# Patient Record
Sex: Female | Born: 1965
Health system: Southern US, Community
[De-identification: ages and names within clinical notes are randomized; demographics above are authoritative.]

## PROBLEM LIST (undated history)

## (undated) DIAGNOSIS — M549 Dorsalgia, unspecified: Secondary | ICD-10-CM

## (undated) DIAGNOSIS — D649 Anemia, unspecified: Secondary | ICD-10-CM

## (undated) DIAGNOSIS — K829 Disease of gallbladder, unspecified: Secondary | ICD-10-CM

## (undated) DIAGNOSIS — E785 Hyperlipidemia, unspecified: Secondary | ICD-10-CM

## (undated) DIAGNOSIS — E538 Deficiency of other specified B group vitamins: Secondary | ICD-10-CM

## (undated) DIAGNOSIS — Z9889 Other specified postprocedural states: Secondary | ICD-10-CM

## (undated) DIAGNOSIS — Z8669 Personal history of other diseases of the nervous system and sense organs: Secondary | ICD-10-CM

## (undated) DIAGNOSIS — R0600 Dyspnea, unspecified: Secondary | ICD-10-CM

## (undated) DIAGNOSIS — E041 Nontoxic single thyroid nodule: Secondary | ICD-10-CM

## (undated) DIAGNOSIS — M25559 Pain in unspecified hip: Secondary | ICD-10-CM

## (undated) DIAGNOSIS — M1990 Unspecified osteoarthritis, unspecified site: Secondary | ICD-10-CM

## (undated) DIAGNOSIS — R112 Nausea with vomiting, unspecified: Secondary | ICD-10-CM

## (undated) DIAGNOSIS — T4145XA Adverse effect of unspecified anesthetic, initial encounter: Secondary | ICD-10-CM

## (undated) DIAGNOSIS — K760 Fatty (change of) liver, not elsewhere classified: Secondary | ICD-10-CM

## (undated) DIAGNOSIS — K219 Gastro-esophageal reflux disease without esophagitis: Secondary | ICD-10-CM

## (undated) DIAGNOSIS — Z8719 Personal history of other diseases of the digestive system: Secondary | ICD-10-CM

## (undated) DIAGNOSIS — T8859XA Other complications of anesthesia, initial encounter: Secondary | ICD-10-CM

## (undated) DIAGNOSIS — J189 Pneumonia, unspecified organism: Secondary | ICD-10-CM

## (undated) DIAGNOSIS — R011 Cardiac murmur, unspecified: Secondary | ICD-10-CM

## (undated) DIAGNOSIS — M2550 Pain in unspecified joint: Secondary | ICD-10-CM

## (undated) DIAGNOSIS — D509 Iron deficiency anemia, unspecified: Secondary | ICD-10-CM

## (undated) DIAGNOSIS — R600 Localized edema: Secondary | ICD-10-CM

## (undated) DIAGNOSIS — M25569 Pain in unspecified knee: Secondary | ICD-10-CM

## (undated) DIAGNOSIS — K5900 Constipation, unspecified: Secondary | ICD-10-CM

## (undated) DIAGNOSIS — I10 Essential (primary) hypertension: Secondary | ICD-10-CM

## (undated) HISTORY — DX: Unspecified osteoarthritis, unspecified site: M19.90

## (undated) HISTORY — DX: Pain in unspecified knee: M25.569

## (undated) HISTORY — DX: Essential (primary) hypertension: I10

## (undated) HISTORY — PX: WISDOM TOOTH EXTRACTION: SHX21

## (undated) HISTORY — DX: Pain in unspecified hip: M25.559

## (undated) HISTORY — DX: Localized edema: R60.0

## (undated) HISTORY — PX: HERNIA REPAIR: SHX51

## (undated) HISTORY — DX: Cardiac murmur, unspecified: R01.1

## (undated) HISTORY — DX: Disease of gallbladder, unspecified: K82.9

## (undated) HISTORY — DX: Deficiency of other specified B group vitamins: E53.8

## (undated) HISTORY — PX: UMBILICAL HERNIA REPAIR: SHX196

## (undated) HISTORY — DX: Dorsalgia, unspecified: M54.9

## (undated) HISTORY — DX: Dyspnea, unspecified: R06.00

## (undated) HISTORY — PX: ABDOMINAL HYSTERECTOMY: SHX81

## (undated) HISTORY — DX: Pain in unspecified joint: M25.50

## (undated) HISTORY — PX: COLONOSCOPY: SHX174

## (undated) HISTORY — DX: Iron deficiency anemia, unspecified: D50.9

## (undated) HISTORY — DX: Fatty (change of) liver, not elsewhere classified: K76.0

## (undated) HISTORY — DX: Constipation, unspecified: K59.00

## (undated) HISTORY — DX: Gastro-esophageal reflux disease without esophagitis: K21.9

## (undated) HISTORY — DX: Hyperlipidemia, unspecified: E78.5

## (undated) HISTORY — DX: Nontoxic single thyroid nodule: E04.1

## (undated) HISTORY — DX: Anemia, unspecified: D64.9

---
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
## 1992-01-14 HISTORY — PX: OTHER SURGICAL HISTORY: SHX169

## 1996-09-12 HISTORY — PX: CARPAL TUNNEL RELEASE: SHX101

## 2000-06-15 HISTORY — PX: ROUX-EN-Y GASTRIC BYPASS: SHX1104

## 2001-06-15 HISTORY — PX: CHOLECYSTECTOMY: SHX55

## 2002-06-15 HISTORY — PX: APPENDECTOMY: SHX54

## 2004-05-15 HISTORY — PX: ABDOMINOPLASTY: SUR9

## 2008-12-18 DIAGNOSIS — R079 Chest pain, unspecified: Secondary | ICD-10-CM | POA: Insufficient documentation

## 2009-03-15 HISTORY — PX: PARTIAL HYSTERECTOMY: SHX80

## 2009-05-14 ENCOUNTER — Ambulatory Visit: Payer: Self-pay | Admitting: Family Medicine

## 2009-05-14 DIAGNOSIS — H6980 Other specified disorders of Eustachian tube, unspecified ear: Secondary | ICD-10-CM | POA: Insufficient documentation

## 2009-05-14 DIAGNOSIS — E785 Hyperlipidemia, unspecified: Secondary | ICD-10-CM | POA: Insufficient documentation

## 2009-05-14 DIAGNOSIS — I10 Essential (primary) hypertension: Secondary | ICD-10-CM | POA: Insufficient documentation

## 2009-05-14 DIAGNOSIS — D509 Iron deficiency anemia, unspecified: Secondary | ICD-10-CM | POA: Insufficient documentation

## 2009-08-25 ENCOUNTER — Ambulatory Visit: Payer: Self-pay | Admitting: Family Medicine

## 2009-09-07 ENCOUNTER — Ambulatory Visit: Payer: Self-pay | Admitting: Family Medicine

## 2009-09-07 DIAGNOSIS — R42 Dizziness and giddiness: Secondary | ICD-10-CM | POA: Insufficient documentation

## 2009-09-07 LAB — CONVERTED CEMR LAB
ALT: 19 units/L (ref 0–35)
AST: 17 units/L (ref 0–37)
Alkaline Phosphatase: 100 units/L (ref 39–117)
Basophils Absolute: 0.1 10*3/uL (ref 0.0–0.1)
Calcium: 9.5 mg/dL (ref 8.4–10.5)
Chloride: 104 meq/L (ref 96–112)
Creatinine, Ser: 0.65 mg/dL (ref 0.40–1.20)
Eosinophils Absolute: 0 10*3/uL (ref 0.0–0.7)
HCT: 39 % (ref 36.0–46.0)
Hemoglobin: 12.4 g/dL (ref 12.0–15.0)
Lymphocytes Relative: 31 % (ref 12–46)
Lymphs Abs: 3.1 10*3/uL (ref 0.7–4.0)
MCHC: 31.8 g/dL (ref 30.0–36.0)
MCV: 85.3 fL (ref 78.0–100.0)
Neutrophils Relative %: 62 % (ref 43–77)
Potassium: 4.6 meq/L (ref 3.5–5.3)
RBC: 4.57 M/uL (ref 3.87–5.11)

## 2009-09-14 ENCOUNTER — Telehealth: Payer: Self-pay | Admitting: Family Medicine

## 2009-09-16 ENCOUNTER — Encounter: Payer: Self-pay | Admitting: Family Medicine

## 2009-12-09 ENCOUNTER — Ambulatory Visit: Payer: Self-pay | Admitting: Family Medicine

## 2009-12-10 LAB — CONVERTED CEMR LAB
Alkaline Phosphatase: 95 units/L (ref 39–117)
BUN: 9 mg/dL (ref 6–23)
CO2: 26 meq/L (ref 19–32)
Cholesterol: 210 mg/dL — ABNORMAL HIGH (ref 0–200)
Creatinine, Ser: 0.59 mg/dL (ref 0.40–1.20)
Glucose, Bld: 88 mg/dL (ref 70–99)
HCT: 39.2 % (ref 36.0–46.0)
HDL: 48 mg/dL (ref >39)
LDL Cholesterol: 119 mg/dL — ABNORMAL HIGH (ref 0–99)
MCHC: 31.9 g/dL (ref 30.0–36.0)
MCV: 90.1 fL (ref 78.0–100.0)
RBC: 4.35 M/uL (ref 3.87–5.11)
Sodium: 141 meq/L (ref 135–145)
Total Bilirubin: 0.4 mg/dL (ref 0.3–1.2)
Total CHOL/HDL Ratio: 4.4
Total Protein: 7.2 g/dL (ref 6.0–8.3)
Triglycerides: 214 mg/dL — ABNORMAL HIGH (ref <150)
VLDL: 43 mg/dL — ABNORMAL HIGH (ref 0–40)
WBC: 9.4 10*3/uL (ref 4.0–10.5)

## 2010-03-14 ENCOUNTER — Ambulatory Visit: Payer: Self-pay | Admitting: Family Medicine

## 2010-03-14 DIAGNOSIS — R109 Unspecified abdominal pain: Secondary | ICD-10-CM | POA: Insufficient documentation

## 2010-03-14 LAB — CONVERTED CEMR LAB
Basophils Absolute: 0 10*3/uL (ref 0.0–0.1)
Bilirubin Urine: NEGATIVE
Eosinophils Absolute: 0.1 10*3/uL (ref 0.0–0.7)
Eosinophils Relative: 1 % (ref 0–5)
HCT: 37.7 % (ref 36.0–46.0)
Hemoglobin: 12.7 g/dL (ref 12.0–15.0)
Ketones, urine, test strip: NEGATIVE
Lymphocytes Relative: 26 % (ref 12–46)
Lymphs Abs: 3.6 10*3/uL (ref 0.7–4.0)
MCV: 88.4 fL (ref 78.0–100.0)
Monocytes Absolute: 0.6 10*3/uL (ref 0.1–1.0)
Platelets: 327 10*3/uL (ref 150–400)
Protein, U semiquant: NEGATIVE
RDW: 15.2 % (ref 11.5–15.5)
Specific Gravity, Urine: <1.005
WBC Urine, dipstick: NEGATIVE
pH: 6.5

## 2010-03-15 LAB — CONVERTED CEMR LAB
Alkaline Phosphatase: 88 units/L (ref 39–117)
CO2: 28 meq/L (ref 19–32)
Creatinine, Ser: 0.57 mg/dL (ref 0.40–1.20)
Glucose, Bld: 97 mg/dL (ref 70–99)
Sodium: 139 meq/L (ref 135–145)
Total Bilirubin: 0.6 mg/dL (ref 0.3–1.2)

## 2010-06-16 NOTE — Assessment & Plan Note (Signed)
Summary: CPE   Vital Signs:  Patient profile:   45 year old female Height:      64 inches Weight:      241 pounds Pulse rate:   81 / minute BP sitting:   128 / 78  (left arm) Cuff size:   large  Vitals Entered By: Avon Gully CMA, Duncan Dull) (December 09, 2009 8:19 AM) CC: CPE no pap Is Patient Diabetic? No   Primary Care Provider:  Nani Gasser MD  CC:  CPE no pap.  History of Present Illness: Here for CPE. No specific concerns. Has had hysterctomy.  Last mammo was in December and was normal.    Current Medications (verified): 1)  Lisinopril-Hydrochlorothiazide 20-12.5 Mg Tabs (Lisinopril-Hydrochlorothiazide) .[name].. 1 Tab By Mouth Once Daily 2)  Pravastatin Sodium 40 Mg Tabs (Pravastatin Sodium) .[name].. 1 Tab  By Mouth Once Daily 3)  Vitamin D 1000 Unit Tabs (Cholecalciferol) .[name].. 1 Tab By Mouth Once Daily 4)  Vitamin B-12 500 Mcg Tabs (Cyanocobalamin) .[name].. 1 Tab By Mouth Once Daily 5)  Multivitamins  Tabs (Multiple Vitamin) .[name].. 1 Tab By Mouth Once Daily 6)  Claritin-D 24 Hour 10-240 Mg Xr24h-Tab (Loratadine-Pseudoephedrine) 7)  Flonase 50 Mcg/act Susp (Fluticasone Propionate) .... Sig 2 Puffs Each Nostril Qday 8)  Singulair 10 Mg Tabs (Montelukast Sodium) .... Take 1 Tablet By Mouth Once A Day  Allergies (verified): 1)  ! * Seasonal  Comments:  Nurse/Medical Assistant: The patient's medications and allergies were reviewed with the patient and were updated in the Medication and Allergy Lists. Avon Gully CMA, Duncan Dull) (December 09, 2009 8:20 AM)  Past History:  Past Medical History: Last updated: 05/14/2009 Allergic rhinitis Anemia-iron deficiency Hyperlipidemia Hypertension  Past Surgical History: Hysterectomy 03/2009 carpal tunnel to right hand 09/1996 hernia repair, umbilical Cholecystectomy 06/2001 Appendectomy 06/2002 Bariatric surgery 06/2000, roux-en-y 2 cysts removed from left hand  01/1992 Left knee cartilage removed 11/1984  Family History: Family  History Diabetes 1st degree relative GF with MI, colon Ca GM with BrCA Father with DM, hi chol, HTN Mother wtih skin cancer, osteoprosis Brother with thyroid Ca  Social History: Reviewed history from 09/07/2009 and no changes required. Works in Clinical biochemist for [employer].  HS degree. Never Smoked Alcohol use-yes Drug use-none Occupation: [employer] Married to [employer] with no kids.  4 caffeinated drinks per day.   Review of Systems  The patient denies anorexia, fever, weight loss, weight gain, vision loss, decreased hearing, hoarseness, chest pain, syncope, dyspnea on exertion, peripheral edema, prolonged cough, headaches, hemoptysis, abdominal pain, melena, hematochezia, severe indigestion/heartburn, hematuria, incontinence, genital sores, muscle weakness, suspicious skin lesions, transient blindness, difficulty walking, depression, unusual weight change, abnormal bleeding, enlarged lymph nodes, and breast masses.    Physical Exam  General:  Well-developed,well-nourished,in no acute distress; alert,appropriate and cooperative throughout examination Head:  Normocephalic and atraumatic without obvious abnormalities. No apparent alopecia or balding. Eyes:  No corneal or conjunctival inflammation noted. EOMI. Perrla.  Ears:  External ear exam shows no significant lesions or deformities.  Otoscopic examination reveals clear canals, tympanic membranes are intact bilaterally without bulging, retraction, inflammation or discharge. Hearing is grossly normal bilaterally. Nose:  External nasal examination shows no deformity or inflammation.  Mouth:  Oral mucosa and oropharynx without lesions or exudates.  Teeth in good repair. Neck:  No deformities, masses, or tenderness noted. Breasts:  Righ breas t with no lesions, normal skin and areola.  Left with a small 0.5 cm round mobile lesion at the  11 o'clock position about 4cm from the edge of the areola.   Lungs:  Normal  respiratory effort, chest expands symmetrically. Lungs are clear to auscultation, no crackles or wheezes. Heart:  Normal rate and regular rhythm. S1 and S2 normal without gallop, murmur, click, rub or other extra sounds. Abdomen:  Bowel sounds positive,abdomen soft and non-tender without masses, organomegaly or hernias noted. Msk:  No deformity or scoliosis noted of thoracic or lumbar spine.   Pulses:  R and L carotid,radialdorsalis pedis and posterior tibial pulses are full and equal bilaterally Extremities:  No clubbing, cyanosis, edema, or deformity noted with normal full range of motion of all joints.   Neurologic:  No cranial nerve deficits noted. Station and gait are normal. DTRs are symmetrical throughout. Sensory, motor and coordinative functions appear intact. Skin:  no rashes.   Cervical Nodes:  No lymphadenopathy noted Axillary Nodes:  No palpable lymphadenopathy Psych:  Cognition and judgment appear intact. Alert and cooperative with normal attention span and concentration. No apparent delusions, illusions, hallucinations   Impression & Recommendations:  Problem # 1:  HEALTH MAINTENANCE EXAM (ICD-V70.0) Exam is normal today except for obesity. Encouraged more regular exercise.  Due for labs Encourage daily calcium and vit D.  TDap given today Monitor lesion on the left breast, if changes or gets larger go for mammo [place]. Her mammogram is current and all have been normal in the past.  Pt understands.   Orders: T-Comprehensive Metabolic Panel [id number]) T-Lipid Profile [id number]) T-Vitamin D (25-Hydroxy) [id number]) T-TSH [phone])  Complete Medication List: 1)  Lisinopril-hydrochlorothiazide 20-12.5 Mg Tabs (Lisinopril-hydrochlorothiazide) .[name].. 1 tab by mouth once daily 2)  Pravastatin Sodium 40 Mg Tabs (Pravastatin sodium) .[name].. 1 tab  by mouth once daily 3)  Vitamin D 1000 Unit Tabs (Cholecalciferol) .[name].. 1 tab by mouth once daily 4)  Vitamin B-12 500 Mcg  Tabs (Cyanocobalamin) .[name].. 1 tab by mouth once daily 5)  Multivitamins Tabs (Multiple vitamin) .[name].. 1 tab by mouth once daily 6)  Claritin-d 24 Hour 10-240 Mg Xr24h-tab (Loratadine-pseudoephedrine) 7)  Flonase 50 Mcg/act Susp (Fluticasone propionate) .... Sig 2 puffs each nostril qday 8)  Singulair 10 Mg Tabs (Montelukast sodium) .... Take 1 tablet by mouth once a day  Other Orders: T-CBC No Diff ([id number]) Tdap => 56yrs IM (02725) Admin 1st Vaccine (36644)  Patient Instructions: 1)  Please schedule a follow-up appointment in 6 months for blood pressure .  2)  It is important that you exercise reguarly at least 20 minutes 5 times a week. If you develop chest pain, have severe difficulty breathing, or feel very tired, stop exercising immediately and seek medical attention.  3)  Take calcium +vitamin D daily.  4)  We will call you with your lab results.  Prescriptions: PRAVASTATIN SODIUM 40 MG TABS (PRAVASTATIN SODIUM) 1 tab  by mouth once daily  #90 x 3   Entered and Authorized by:   Nani Gasser MD   Signed by:   Nani Gasser MD on 12/09/2009   Method used:   Electronically to        [facility] [phone]* (retail)       [address] [place], [place]  [address]       Ph: [phone]       Fax: [phone]   RxID:   [id number] LISINOPRIL-HYDROCHLOROTHIAZIDE 20-12.5 MG TABS (LISINOPRIL-HYDROCHLOROTHIAZIDE) 1 tab by mouth once daily  #90 x 3   Entered and Authorized by:  Nani Gasser MD   Signed by:   Nani Gasser MD on 12/09/2009   Method used:   Electronically to        [facility] [phone]* (retail)       [address] [place], [place]  [address]       Ph: [phone]       Fax: [phone]   RxID:   [id number] SINGULAIR 10 MG TABS (MONTELUKAST SODIUM) Take 1 tablet by mouth once a day  #30 x 6   Entered and Authorized by:   Nani Gasser MD   Signed by:   Nani Gasser MD on 12/09/2009   Method used:   Electronically  to        [facility] (retail)       [address]       [place], [place]  [address]       Ph: [phone]       Fax: [phone]   RxID:   [id number] FLONASE 50 MCG/ACT SUSP (FLUTICASONE PROPIONATE) sig 2 puffs each nostril qday  #1 x 6   Entered and Authorized by:   Nani Gasser MD   Signed by:   Nani Gasser MD on 12/09/2009   Method used:   Electronically to        [facility] (retail)       [address]       [place], [place]  [address]       Ph: [phone]       Fax: [phone]   RxID:   [id number]   PAP Next Due:  Not Indicated Mammogram Result Date:  04/14/2009 Mammogram Result:  normal Mammogram Next Due:  1 yr     Immunizations Administered:  Tetanus Vaccine:    Vaccine Type: Tdap    Site: right deltoid    Mfr: GlaxoSmithKline    Dose: 0.5 ml    Route: IM    Given by: Sue Lush McCrimmon CMA, (AAMA)    Exp. Date: 11/12/2011    Lot #: [id number]    VIS given: 04/02/07 version given December 09, 2009.

## 2010-06-16 NOTE — Assessment & Plan Note (Signed)
Summary: Abdominal pain    Vital Signs:  Patient profile:   45 year old female Height:      64 inches Weight:      239 pounds Temp:     98.4 degrees F oral Pulse rate:   87 / minute BP sitting:   136 / 82  (right arm) Cuff size:   large  Vitals Entered By: Avon Gully CMA, Duncan Dull) (March 14, 2010 2:46 PM) CC: abdomical pain since yesterday,sharp pain in lower abd when bladder get full,painfull urinations, had gastric bypass 10 yrs ago and they "caused a hernia" pt concerned something going on with that   Primary Care Provider:  Nani Gasser MD  CC:  abdomical pain since yesterday, sharp pain in lower abd when bladder get full, painfull urinations, and had gastric bypass 10 yrs ago and they "caused a hernia" pt concerned something going on with that.  History of Present Illness: abdomical pain since yesterday,sharp pain in lower abd when bladder get full,painfull urinations, had gastric bypass 10 yrs ago and they "caused a hernia" pt concerned something going on with that. Pain started yesterday when woke up. Pain over the pelvic region on teh right adn left. Does have mesh in placed over the rou-en-y. Yesterday temp to 101.  Today 100.2.  No back pain.  No nausea. Dec appetite.  Initially thought constipated adn tried some citrus mag and says it cleaned her out but hte pain is still present.  Over the weekend did alot of yard work. Taking Tylneol for pian and fever. Pain is worse with movement adnwalking. BEtter at rest. Painful to lay flat. no back pain.   Current Medications (verified): 1)  Lisinopril-Hydrochlorothiazide 20-12.5 Mg Tabs (Lisinopril-Hydrochlorothiazide) .[name].. 1 Tab By Mouth Once Daily 2)  Pravastatin Sodium 40 Mg Tabs (Pravastatin Sodium) .[name].. 1 Tab  By Mouth Once Daily 3)  Vitamin D 1000 Unit Tabs (Cholecalciferol) .[name].. 1 Tab By Mouth Once Daily 4)  Vitamin B-12 500 Mcg Tabs (Cyanocobalamin) .[name].. 1 Tab By Mouth Once Daily 5)  Multivitamins  Tabs (Multiple  Vitamin) .[name].. 1 Tab By Mouth Once Daily 6)  Flonase 50 Mcg/act Susp (Fluticasone Propionate) .... Sig 2 Puffs Each Nostril Qday 7)  Singulair 10 Mg Tabs (Montelukast Sodium) .... Take 1 Tablet By Mouth Once A Day  Allergies (verified): 1)  ! * Seasonal  Comments:  Nurse/Medical Assistant: The patient's medications and allergies were reviewed with the patient and were updated in the Medication and Allergy Lists. 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 No apparent alopecia or balding. Lungs:  Normal respiratory effort, chest expands symmetrically. Lungs are clear to auscultation, no crackles or wheezes. Heart:  Normal rate and regular rhythm. S1 and S2 normal without gallop, murmur, click, rub or other extra sounds. Abdomen:  soft and normal bowel sounds.  Tender over the epigastrum and the RLQ and LLQ.  Skin:  no rashes.   Cervical Nodes:  No lymphadenopathy noted Psych:  Cognition and judgment appear intact. Alert and cooperative with normal attention span and concentration. No apparent delusions, illusions, hallucinations   Impression & Recommendations:  Problem # 1:  PELVIC  PAIN (ICD-789.09) Discussed likely UTI vs colitis vs hernia. Her bowels have moved so I don't suspect an ileus at this time. Her abdomen is very tender. She has had a hystrectomy so STI, etc is ruled out.  Can use her Tylneol and tramadol as needed for pain. Start ABX for UTI and will get stat  CBC. IF WBC elevated consider abdominal CT since she has had fever and pain is worse with movement.  Orders: UA Dipstick w/o Micro (automated)  (81003) T-CBC w/Diff ([id number]) T-Comprehensive Metabolic Panel ([id number])  Her updated medication list for this problem includes:    Tramadol Hcl 50 Mg Tabs (Tramadol hcl) .[name]... Take 1 tablet by mouth three times a day as needed for severe pain  Complete Medication List: 1)  Lisinopril-hydrochlorothiazide 20-12.5 Mg Tabs (Lisinopril-hydrochlorothiazide) .[name].. 1 tab by mouth once daily 2)  Pravastatin Sodium 40 Mg Tabs (Pravastatin sodium) .[name].. 1 tab  by mouth once daily 3)  Vitamin D 1000 Unit Tabs (Cholecalciferol) .[name].. 1 tab by mouth once daily 4)  Vitamin B-12 500 Mcg Tabs (Cyanocobalamin) .[name].. 1 tab by mouth once daily 5)  Multivitamins Tabs (Multiple vitamin) .[name].. 1 tab by mouth once daily 6)  Flonase 50 Mcg/act Susp (Fluticasone propionate) .... Sig 2 puffs each nostril qday 7)  Singulair 10 Mg Tabs (Montelukast sodium) .... Take 1 tablet by mouth once a day 8)  Cipro 500 Mg Tabs (Ciprofloxacin hcl) .... Take 1 tablet by mouth two times a day for 3 days 9)  Tramadol Hcl 50 Mg Tabs (Tramadol hcl) .... Take 1 tablet by mouth three times a day as needed for severe pain  Patient Instructions: 1)  Call if not better in 48 hours. Call if gets worse. If pain becomes after hours then go to the Emergency Room.  Prescriptions: TRAMADOL HCL 50 MG TABS (TRAMADOL HCL) Take 1 tablet by mouth three times a day as needed for severe pain  #12 x 0   Entered and Authorized by:   Nani Gasser MD   Signed by:   Nani Gasser MD on 03/14/2010   Method used:   Electronically to        [facility] (retail)       [address]       [place], [place]  [address]       Ph: [phone]       Fax: [phone]   RxID:   [id number] CIPRO 500 MG TABS (CIPROFLOXACIN HCL) Take 1 tablet by mouth two times a day for 3 days  #6 x 0    Entered and Authorized by:   Nani Gasser MD   Signed by:   Nani Gasser MD on 03/14/2010   Method used:   Electronically to        [facility] (retail)       [address]       [place], [place]  [address]       Ph: [phone]       Fax: [phone]   RxID:   [id number]    Orders Added: 1)  UA Dipstick w/o Micro (automated)  [81003] 2)  T-CBC w/Diff [[id number]] 3)  T-Comprehensive Metabolic Panel [[id number]] 4)  Est. Patient Level IV [70623]    Laboratory Results   Urine Tests  Date/Time Received: 03/14/10 Date/Time Reported: 03/14/10  Routine Urinalysis   Color: yellow Appearance: Clear Glucose: negative   (Normal Range: Negative) Bilirubin: negative   (  Normal Range: Negative) Ketone: negative   (Normal Range: Negative) Spec. Gravity: <1.005   (Normal Range: 1.003-1.035) Blood: small   (Normal Range: Negative) pH: 6.5   (Normal Range: 5.0-8.0) Protein: negative   (Normal Range: Negative) Urobilinogen: 0.2   (Normal Range: 0-1) Nitrite: negative   (Normal Range: Negative) Leukocyte Esterace: negative   (Normal Range: Negative)

## 2010-06-16 NOTE — Assessment & Plan Note (Signed)
Summary: NOV: Dizziness, AR   Vital Signs:  Patient profile:   45 year old female Height:      64 inches Weight:      236 pounds BMI:     40.66 Temp:     98.8 degrees F oral Pulse rate:   73 / minute Pulse (ortho):   72 / minute BP sitting:   126 / 75  (left arm) BP standing:   118 / 88 Cuff size:   large  Vitals Entered By: Kathlene November (September 07, 2009 9:09 AM)  Serial Vital Signs/Assessments:  Time      Position  BP       Pulse  Resp  Temp     By 9:48 AM   Lying LA  110/82   68                    Kim Johnson 9:48 AM   Sitting   118/84   80                    Kathlene November 9:48 AM   Standing  118/88   72                    Kathlene November  CC: NP- get established- last 3 days pt states feels "drunk" and off balance Is Patient Diabetic? No   Primary Care Provider:  Nani Gasser MD  CC:  NP- get established- last 3 days pt states feels "drunk" and off balance.  History of Present Illness: NP- get established- last 3 days pt states feels "drunk" and off balance.  Started Sunday (about 2 days ago).  Felt hot all the sudden adn then for the rest of the day felt off balance.  No syncope.  No drugs.  Tooks nap and then felt better. Then yesterday had very similar sxs.  Went away yesterday evening.  Felt this way as soon as got out of bed this AM.  Thought initially got overheated.  No HA.  Taking Flonase and zyrtec-D.  Stopped the zyrtec D on Sunday.  Went to UC 2 weeks ago and tx for bronchitis. never filled the inhaler.  No ear pain. No fever. Never happened before. no weakness. She feels like she is slurring her speech.  Hx of anemia before her hysterectomy.  NO CP or SOB. Says allegra never worked and the claritin stopped working. Gets some relief with zyrtec but not completely. Says the singulair works the best.   Habits & Providers  Alcohol-Tobacco-Diet     Alcohol drinks/day: <1     Tobacco Status: never  Exercise-Depression-Behavior     Does Patient Exercise: no     STD  Risk: never     Drug Use: never     Seat Belt Use: always  Current Medications (verified): 1)  Lisinopril-Hydrochlorothiazide 20-12.5 Mg Tabs (Lisinopril-Hydrochlorothiazide) .[name].. 1 Tab By Mouth Once Daily 2)  Pravastatin Sodium 40 Mg Tabs (Pravastatin Sodium) .[name].. 1 Tab  By Mouth Once Daily 3)  Vitamin D 1000 Unit Tabs (Cholecalciferol) .[name].. 1 Tab By Mouth Once Daily 4)  Vitamin B-12 500 Mcg Tabs (Cyanocobalamin) .[name].. 1 Tab By Mouth Once Daily 5)  Multivitamins  Tabs (Multiple Vitamin) .[name].. 1 Tab By Mouth Once Daily 6)  Claritin-D 24 Hour 10-240 Mg Xr24h-Tab (Loratadine-Pseudoephedrine) 7)  Flonase 50 Mcg/act Susp (Fluticasone Propionate) .... Sig 2 Puffs Each Nostril Qday  Allergies (verified): 1)  ! * Seasonal  Comments:  Nurse/Medical Assistant: The patient's medications and allergies were reviewed with the patient and were updated in the Medication and Allergy Lists. Kathlene November (September 07, 2009 9:11 AM)  Past History:  Past Surgical History: Hysterectomy 03/2009 orthoscopic left knee carpal tunnel to right hand 09/1996 hernia repair Cholecystectomy 06/2001 Appendectomy 06/2002 Bariatric surgery 06/2000 2 cysts removed from hand  01/1992 Left knee cartilage removed 11/1984  Family History: Family History Diabetes 1st degree relative GF with MI, colon Ca GM with BrCA Father with DM, hi chol, HTN Mother wtih skin cancer  Social History: Works in Clinical biochemist for [employer].  HS degree. Never Smoked Alcohol use-yes Drug use-none Occupation: [employer] Married to [employer] with no kids.  4 caffeinated drinks per day.  Does Patient Exercise:  no STD Risk:  never Drug Use:  never Seat Belt Use:  always  Review of Systems       No fever/sweats/weakness, unexplained weight loss/gain.  No vison changes.  No difficulty hearing/ringing in ears, + hay fever/allergies.  No chest pain/discomfort, palpitations.  No Br lump/nipple discharge.  No  cough/wheeze.  No blood in BM, nausea/vomiting/diarrhea.  No nighttime urination, leaking urine, unusual vaginal bleeding, discharge (penis or vagina).  No muscle/joint pain. No rash, change in mole.  No HA, memory loss.  No anxiety, sleep d/o, depression.  No easy bruising/bleeding, unexplained lump   Physical Exam  General:  Well-developed,well-nourished,in no acute distress; alert,appropriate and cooperative throughout examination Head:  Normocephalic and atraumatic without obvious abnormalities. No apparent alopecia or balding. Eyes:  No corneal or conjunctival inflammation noted. EOMI. Perrla.  Ears:  External ear exam shows no significant lesions or deformities.  Otoscopic examination reveals clear canals, tympanic membranes are intact bilaterally without bulging, retraction, inflammation or discharge. Hearing is grossly normal bilaterally. Nose:  External nasal examination shows no deformity or inflammation. Nasal mucosa are pink and moist without lesions or exudates. The turbinates are not swollen.  Mouth:  Oral mucosa and oropharynx without lesions or exudates.  Teeth in good repair. Neck:  No deformities, masses, or tenderness noted. NO TM.  Lungs:  Normal respiratory effort, chest expands symmetrically. Lungs are clear to auscultation, no crackles or wheezes. Heart:  Normal rate and regular rhythm. S1 and S2 normal without gallop, murmur, click, rub or other extra sounds. No carotid or abdominal bruits.  Abdomen:  Bowel sounds positive,abdomen soft and non-tender without masses, organomegaly or hernias noted. Msk:  Neck and UE and LE sterngth 5/5 with NROM.   Pulses:  RAdial 2+ bilat.   Extremities:  NO LE edema.   Neurologic:  alert & oriented X3, cranial nerves II-XII intact, strength normal in all extremities, gait normal, DTRs symmetrical and normal, finger-to-nose normal, heel-to-shin normal, and Romberg negative.  Normal speech and orientation.   Skin:  no rashes.   Cervical  Nodes:  No lymphadenopathy noted Psych:  Cognition and judgment appear intact. Alert and cooperative with normal attention span and concentration. No apparent delusions, illusions, hallucinations   Impression & Recommendations:  Problem # 1:  DIZZINESS (ICD-780.4) Exam is completely normal. Normal orthostatics. Normal neurollogic exam and ear exam. Hold zyrtec D for now.  also will get labs to rule out anemia, thyroid problems or electrolyte distrubance. Call if any changes.  She is not really taking any other medications that should be causing ehr sxs.  BP well controlled.  Orders: T-TSH [id number]) T-CBC w/Diff [id number]) T-Comprehensive Metabolic Panel [id number]) T-Iron [id number])  Problem # 2:  ALLERGIC  RHINITIS (ICD-477.9) Given samples of singulair.Will try to get her singulair prior auth'd.  Hold Zyrtec D for now as I am concnered that the decongestant may be causing some of her sxs.   Orders: T-TSH [id number]) T-CBC w/Diff [id number]) T-Comprehensive Metabolic Panel [id number]) T-Iron [id number])  The following medications were removed from the medication list:    Flonase 50 Mcg/act Susp (Fluticasone propionate) ..... Sig 2 puffs each nostril q day Her updated medication list for this problem includes:    Flonase 50 Mcg/act Susp (Fluticasone propionate) ..... Sig 2 puffs each nostril qday  Complete Medication List: 1)  Lisinopril-hydrochlorothiazide 20-12.5 Mg Tabs (Lisinopril-hydrochlorothiazide) .[name].. 1 tab by mouth once daily 2)  Pravastatin Sodium 40 Mg Tabs (Pravastatin sodium) .[name].. 1 tab  by mouth once daily 3)  Vitamin D 1000 Unit Tabs (Cholecalciferol) .[name].. 1 tab by mouth once daily 4)  Vitamin B-12 500 Mcg Tabs (Cyanocobalamin) .[name].. 1 tab by mouth once daily 5)  Multivitamins Tabs (Multiple vitamin) .[name].. 1 tab by mouth once daily 6)  Claritin-d 24 Hour 10-240 Mg Xr24h-tab (Loratadine-pseudoephedrine) 7)  Flonase 50 Mcg/act Susp (Fluticasone  propionate) .... Sig 2 puffs each nostril qday 8)  Singulair 10 Mg Tabs (Montelukast sodium) .... Take 1 tablet by mouth once a day  Patient Instructions: 1)  We will call you with your lab results either tomorrow or Thursday.  2)  We will try to prior authorize your singulair.   3)  Hold the zytec D for now.  Prescriptions: SINGULAIR 10 MG TABS (MONTELUKAST SODIUM) Take 1 tablet by mouth once a day  #30 x 1   Entered and Authorized by:   Nani Gasser MD   Signed by:   Nani Gasser MD on 09/07/2009   Method used:   Electronically to        [id number] ([facility]ail[facility]       [address]       [place], [place]  [address]       Ph: [phone]       Fax: [phone]   RxID:   [id number]

## 2010-06-16 NOTE — Medication Information (Signed)
Summary: Prior Authorization & Approval for Singulair/[employer]  Prior Authorization & Approval for Singulair/[employer]   Imported By: Lanelle Bal 10/04/2009 12:21:55  _____________________________________________________________________  External Attachment:    Type:   Image     Comment:   External Document

## 2010-06-16 NOTE — Progress Notes (Signed)
Summary: prior auth form for singulair faxed to [employer] Call   Call placed by: Marj Call placed to: [employer] Summary of Call: Form from [employer] faxed to [facility] for prior auth on Singulair 10 mg tablets Initial call taken by: Roselle Locus,  Sep 14, 2009 2:07 PM  Follow-up for Phone Call        filled form out and faxed to [facility] prior auth dept. KJ LPN Follow-up by: Kathlene November,  Sep 14, 2009 2:31 PM

## 2010-06-16 NOTE — Letter (Signed)
Summary: Out of Work  [facility]  [address]   [place], [place] [address]   Phone: [phone]  Fax: [phone]    August 25, 2009   Employee:  Lisa Stanton    To Whom It May Concern:   For Medical reasons, please excuse the above named employee from work for the following dates:  Start:   08/25/2009  Return :   08/27/2009  If you need additional information, please feel free to contact our office.         Sincerely,    Hassan Rowan MD

## 2010-06-16 NOTE — Assessment & Plan Note (Signed)
Summary: COUGH & HEADACHE/KH   Vital Signs:  Patient Profile:   45 Years Old Female CC:      productive cough, SOB with exertion, HAs, runny nose sinus congestion X 4 days Height:     65 inches Weight:      238 pounds O2 Sat:      96 % O2 treatment:    Room Air Temp:     98.3 degrees F oral Pulse rate:   88 / minute Pulse rhythm:   regular Resp:     18 per minute BP sitting:   131 / 86  (right arm) Cuff size:   large  Pt. in pain?   yes    Location:   head    Intensity:   4    Type:       aching  Vitals Entered By: Lajean Saver RN (August 25, 2009 2:28 PM)                   Prior Medication List:  LISINOPRIL-HYDROCHLOROTHIAZIDE 20-12.5 MG TABS (LISINOPRIL-HYDROCHLOROTHIAZIDE) 1 tab by mouth once daily PRAVASTATIN SODIUM 40 MG TABS (PRAVASTATIN SODIUM) 1 tab  by mouth once daily SINGULAIR 10 MG TABS (MONTELUKAST SODIUM) 1 tab by mouth once daily VITAMIN D 1000 UNIT TABS (CHOLECALCIFEROL) 1 tab by mouth once daily VITAMIN B-12 500 MCG TABS (CYANOCOBALAMIN) 1 tab by mouth once daily MULTIVITAMINS  TABS (MULTIPLE VITAMIN) 1 tab by mouth once daily BENZONATATE 200 MG CAPS (BENZONATATE) One by mouth two times a day to three times a day as needed for cough AZITHROMYCIN 250 MG TABS (AZITHROMYCIN) Two tabs by mouth on day 1, then 1 tab daily on days 2 through 5   Updated Prior Medication List: LISINOPRIL-HYDROCHLOROTHIAZIDE 20-12.5 MG TABS (LISINOPRIL-HYDROCHLOROTHIAZIDE) 1 tab by mouth once daily PRAVASTATIN SODIUM 40 MG TABS (PRAVASTATIN SODIUM) 1 tab  by mouth once daily VITAMIN D 1000 UNIT TABS (CHOLECALCIFEROL) 1 tab by mouth once daily VITAMIN B-12 500 MCG TABS (CYANOCOBALAMIN) 1 tab by mouth once daily MULTIVITAMINS  TABS (MULTIPLE VITAMIN) 1 tab by mouth once daily ROBITUSSIN DM 100-10 MG/5ML SYRP (DEXTROMETHORPHAN-GUAIFENESIN) prn CLARITIN-D 24 HOUR 10-240 MG XR24H-TAB (LORATADINE-PSEUDOEPHEDRINE)   Current Allergies (reviewed today): ! * SEASONALHistory of Present  Illness Chief Complaint: productive cough, SOB with exertion, HAs, runny nose sinus congestion X 4 days History of Present Illness: Patient w/a HX of seasonal allergies who has been on singulair before whose insurance will not autyhorize it now. She has use other agents before like claritin and,allegre w/o success. Her problems started Saturday and now she has a raspy voice, running nose, and treaca ronchi8/wheeziong on exertion . One of the few times her throat has not been sore.   Current Problems: BRONCHITIS, ACUTE WITH BRONCHOSPASM (ICD-466.0) ALLERGIC RHINITIS (ICD-477.9) EUSTACHIAN TUBE DYSFUNCTION (ICD-381.81) URI (ICD-465.9) FAMILY HISTORY DIABETES 1ST DEGREE RELATIVE (ICD-V18.0) HYPERTENSION (ICD-401.9) HYPERLIPIDEMIA (ICD-272.4) ANEMIA-IRON DEFICIENCY (ICD-280.9) ALLERGIC RHINITIS (ICD-477.9)   Current Meds LISINOPRIL-HYDROCHLOROTHIAZIDE 20-12.5 MG TABS (LISINOPRIL-HYDROCHLOROTHIAZIDE) 1 tab by mouth once daily PRAVASTATIN SODIUM 40 MG TABS (PRAVASTATIN SODIUM) 1 tab  by mouth once daily VITAMIN D 1000 UNIT TABS (CHOLECALCIFEROL) 1 tab by mouth once daily VITAMIN B-12 500 MCG TABS (CYANOCOBALAMIN) 1 tab by mouth once daily MULTIVITAMINS  TABS (MULTIPLE VITAMIN) 1 tab by mouth once daily ROBITUSSIN DM 100-10 MG/5ML SYRP (DEXTROMETHORPHAN-GUAIFENESIN) prn CLARITIN-D 24 HOUR 10-240 MG XR24H-TAB (LORATADINE-PSEUDOEPHEDRINE)  FLONASE 50 MCG/ACT SUSP (FLUTICASONE PROPIONATE) sig 2 puffs each nostril qday ZITHROMAX Z-PAK 250 MG  TABS (AZITHROMYCIN) Use as directed ZYRTEC-D  ALLERGY & CONGESTION 5-120 MG XR12H-TAB (CETIRIZINE-PSEUDOEPHEDRINE) sig 1 by mouth twice a day FLONASE 50 MCG/ACT SUSP (FLUTICASONE PROPIONATE) sig 2 puffs each nostril q day PROAIR HFA 108 (90 BASE) MCG/ACT AERS (ALBUTEROL SULFATE) sig 2 puff 3-4x a day as needed for SOB FLOVENT HFA 220 MCG/ACT AERO (FLUTICASONE PROPIONATE  HFA) sig 2puffs q day to 2x a day  REVIEW OF SYSTEMS Constitutional Symptoms      Denies  fever, chills, night sweats, weight loss, weight gain, and fatigue.  Eyes       Denies change in vision, eye pain, eye discharge, glasses, contact lenses, and eye surgery. Ear/Nose/Throat/Mouth       Complains of frequent runny nose and sinus problems.      Denies hearing loss/aids, change in hearing, ear pain, ear discharge, dizziness, frequent nose bleeds, sore throat, hoarseness, and tooth pain or bleeding.  Respiratory       Complains of productive cough.      Denies dry cough, wheezing, shortness of breath, asthma, bronchitis, and emphysema/COPD.  Cardiovascular       Denies murmurs, chest pain, and tires easily with exhertion.    Gastrointestinal       Denies stomach pain, nausea/vomiting, diarrhea, constipation, blood in bowel movements, and indigestion. Genitourniary       Denies painful urination, kidney stones, and loss of urinary control. Neurological       Complains of headaches.      Denies paralysis, seizures, and fainting/blackouts. Musculoskeletal       Denies muscle pain, joint pain, joint stiffness, decreased range of motion, redness, swelling, muscle weakness, and gout.  Skin       Denies bruising, unusual mles/lumps or sores, and hair/skin or nail changes.  Psych       Complains of speech problems.      Denies mood changes, temper/anger issues, anxiety/stress, depression, and sleep problems. Other Comments: symptoms X saturday, patient has taken sudafed, tussin, claritin without relief   Past History:  Family History: Last updated: 08/25/2009 Family History Diabetes 1st degree relative Family History of Cardiovascular disorder- father  Social History: Last updated: 08/25/2009 Never Smoked Alcohol use-no Drug use-nnone Occupation: [employer] Married  Past Medical History: Reviewed history from 05/14/2009 and no changes required. Allergic rhinitis Anemia-iron deficiency Hyperlipidemia Hypertension  Past Surgical  History: Hysterectomy orthoscopic left knee carpal tunnel toright hand hernia repair Cholecystectomy  Family History: Reviewed history from 05/14/2009 and no changes required. Family History Diabetes 1st degree relative Family History of Cardiovascular disorder- father  Social History: Reviewed history from 05/14/2009 and no changes required. Never Smoked Alcohol use-no Drug use-nnone Occupation: [employer] Married Physical Exam General appearance: obese, well developed, well nourished, no acute distress Head: normocephalic, atraumatic Ears: normal, no lesions or deformities Nasal: pale, boggy, swollen nasal turbinates Oral/Pharynx: pharyngeal erythema without exudate, uvula midline without deviation Neck: supple,anterior lymphadenopathy present Chest/Lungs: no rales, wheezes, or rhonchi bilateral, breath sounds equal without effort Heart: regular rate and  rhythm, no murmur Skin: no obvious rashes or lesions MSE: oriented to time, place, and person Assessment New Problems: BRONCHITIS, ACUTE WITH BRONCHOSPASM (ICD-466.0) ALLERGIC RHINITIS (ICD-477.9)  bronchitis and sinsusitis  Patient Education: Patient and/or caregiver instructed in the following: rest fluids and Tylenol.  Plan New Medications/Changes: FLOVENT HFA 220 MCG/ACT AERO (FLUTICASONE PROPIONATE  HFA) sig 2puffs q day to 2x a day  #1 x 0, 08/25/2009, Hassan Rowan MD PROAIR HFA 108 (90 BASE) MCG/ACT AERS (ALBUTEROL SULFATE) sig 2 puff 3-4x a  day as needed for SOB  #1 x 0, 08/25/2009, Hassan Rowan MD FLONASE 50 MCG/ACT SUSP (FLUTICASONE PROPIONATE) sig 2 puffs each nostril q day  #1 x 0, 08/25/2009, Hassan Rowan MD ZYRTEC-D ALLERGY & CONGESTION 5-120 MG XR12H-TAB (CETIRIZINE-PSEUDOEPHEDRINE) sig 1 by mouth twice a day  #60 x 0, 08/25/2009, Hassan Rowan MD ZITHROMAX Z-PAK 250 MG  TABS (AZITHROMYCIN) Use as directed  #1 x 0, 08/25/2009, Hassan Rowan MD  New Orders: Est. Patient Level III [phone] Planning  Comments:   as below  Follow Up: Follow up in 2-3 days if no improvement, Follow up on an as needed basis, Follow up with Primary Physician Work/School Excuse: Return to work/school in 2 days  The patient and/or caregiver has been counseled thoroughly with regard to medications prescribed including dosage, schedule, interactions, rationale for use, and possible side effects and they verbalize understanding.  Diagnoses and expected course of recovery discussed and will return if not improved as expected or if the condition worsens. Patient and/or caregiver verbalized understanding.  Prescriptions: FLOVENT HFA 220 MCG/ACT AERO (FLUTICASONE PROPIONATE  HFA) sig 2puffs q day to 2x a day  #1 x 0   Entered and Authorized by:   Hassan Rowan MD   Signed by:   Hassan Rowan MD on 08/25/2009   Method used:   Printed then faxed to ...       [facility] [address] ([facility]ail)       [address]       [place], [place]  [address]       Ph: [phone]       Fax: [phone]   RxID:   [id number] PROAIR HFA 108 (90 BASE) MCG/ACT AERS (ALBUTEROL SULFATE) sig 2 puff 3-4x a day as needed for SOB  #1 x 0   Entered and Authorized by:   Hassan Rowan MD   Signed by:   Hassan Rowan MD on 08/25/2009   Method used:   Printed then faxed to ...       [facility] [address] (retail)       [address]       [place], [place]  [address]       Ph: [phone]       Fax: [phone]   RxID:   [id number] FLONASE 50 MCG/ACT SUSP (FLUTICASONE PROPIONATE) sig 2 puffs each nostril q day  #1 x 0   Entered and Authorized by:   Hassan Rowan MD   Signed by:   Hassan Rowan MD on 08/25/2009   Method used:   Printed then faxed to ...       [facility] [address] [facility] [address] (retail)       [address]       [place], [place]  [address]       Ph: [phone]       Fax: [phone]   RxID:   [id number] ZYRTEC-D ALLERGY & CONGESTION 5-120 MG XR12H-TAB (CETIRIZINE-PSEUDOEPHEDRINE) sig 1 by mouth twice a day  #60 x 0   Entered  and Authorized by:   Hassan Rowan MD   Signed by:   Hassan Rowan MD on 08/25/2009   Method used:   Printed then faxed [facility] ...       [facility] [facility]       [address]       [place], [place]  [address]       Ph: [phone]       Fax: [phone]   RxID:  [id number] ZITHROMAX Z-PAK 250 MG  TABS (AZITHROMYCIN) Use as directed  #1 x 0   Entered and Authorized by:   Hassan Rowan MD   Signed by:   Hassan Rowan MD on 08/25/2009   Method used:   Printed then faxed to ...       [facility] [address] (retail)       [address]       [place], [place]  [address]       Ph: [phone]       Fax: [phone]   RxID:   [id number]   Patient Instructions: 1)  Please schedule an appointment with your primary doctor in : 2)  Take your antibiotic as prescribed until ALL of it is gone, but stop if you develop a rash or swelling and contact our office as soon as possible. 3)  Acute bronchitis symptoms for less than 10 days are not helped by antibiotics. take over the counter cough medications. call if no improvment in  5-7 days, sooner if increasing cough, fever, or new symptoms( shortness of breath, chest pain). 4)  Recommended remaining out of work for next 2 days return 08/27/2009 5)  It is important to use your inhaler properly. Use a spacer, take slow deep breaths and hold them. Rinse your mouth after using. atient needs to gert established w/a  PCP to get Singulair authorized for scrips.

## 2010-08-11 ENCOUNTER — Telehealth: Payer: Self-pay | Admitting: *Deleted

## 2010-08-11 DIAGNOSIS — Z1273 Encounter for screening for malignant neoplasm of ovary: Secondary | ICD-10-CM

## 2010-08-11 NOTE — Telephone Encounter (Signed)
Pt states she was D/C from [facility] and Radiologist called her today stating she needed pelvic US to further evaluate lesion seen on ovary. Pt has been having pain so would like to go ahead and have this done at [facility] and will f/u w/ you after scan. Please order [name]

## 2010-08-17 ENCOUNTER — Other Ambulatory Visit: Payer: Self-pay | Admitting: Family Medicine

## 2010-08-17 DIAGNOSIS — Z1273 Encounter for screening for malignant neoplasm of ovary: Secondary | ICD-10-CM

## 2010-08-18 ENCOUNTER — Other Ambulatory Visit: Payer: Self-pay

## 2010-08-22 ENCOUNTER — Telehealth: Payer: Self-pay | Admitting: Family Medicine

## 2010-08-22 ENCOUNTER — Ambulatory Visit
Admission: RE | Admit: 2010-08-22 | Discharge: 2010-08-22 | Disposition: A | Discharge status: Home / Self Care | Payer: 59 | Source: Ambulatory Visit | Attending: Family Medicine | Admitting: Family Medicine

## 2010-08-22 DIAGNOSIS — Z1273 Encounter for screening for malignant neoplasm of ovary: Secondary | ICD-10-CM

## 2010-08-22 NOTE — Telephone Encounter (Signed)
Call pt: Still saw cyst on the [name] but does look benign. They do recommend repeat in 8 weeks to make sure it resolves.

## 2010-08-23 NOTE — Telephone Encounter (Signed)
Pt.notified

## 2010-09-13 HISTORY — PX: ABDOMINAL HERNIA REPAIR: SHX539

## 2010-09-22 ENCOUNTER — Encounter: Payer: Self-pay | Admitting: Family Medicine

## 2010-10-02 ENCOUNTER — Other Ambulatory Visit: Payer: Self-pay | Admitting: Family Medicine

## 2010-10-25 ENCOUNTER — Telehealth: Payer: Self-pay | Admitting: Family Medicine

## 2010-10-25 DIAGNOSIS — N83209 Unspecified ovarian cyst, unspecified side: Secondary | ICD-10-CM

## 2010-10-25 NOTE — Telephone Encounter (Signed)
Patient called into office to get order for ultrasound.  Had ultrasound 8 weeks ago and was told to have another ultrasound in 8 weeks.  Please put order in for [facility] and patient will come in this week to have the ultrasound done.

## 2010-10-25 NOTE — Telephone Encounter (Signed)
Order put in. OK to schedule.

## 2010-10-26 ENCOUNTER — Other Ambulatory Visit: Payer: Self-pay | Admitting: Family Medicine

## 2010-10-26 DIAGNOSIS — Z1231 Encounter for screening mammogram for malignant neoplasm of breast: Secondary | ICD-10-CM

## 2010-10-28 ENCOUNTER — Telehealth: Payer: Self-pay | Admitting: Family Medicine

## 2010-10-28 ENCOUNTER — Ambulatory Visit
Admission: RE | Admit: 2010-10-28 | Discharge: 2010-10-28 | Disposition: A | Discharge status: Home / Self Care | Payer: 59 | Source: Ambulatory Visit | Attending: Family Medicine | Admitting: Family Medicine

## 2010-10-28 DIAGNOSIS — N83209 Unspecified ovarian cyst, unspecified side: Secondary | ICD-10-CM

## 2010-10-28 DIAGNOSIS — R1900 Intra-abdominal and pelvic swelling, mass and lump, unspecified site: Secondary | ICD-10-CM

## 2010-10-28 NOTE — Telephone Encounter (Signed)
Call pt: Lisa Stanton is stable but they think it is either the ovary or possibly fluid on her tube. They recommend MRI for further imaging.  I will put in order for this but it may have to be prior auth'd.

## 2010-10-31 ENCOUNTER — Ambulatory Visit (INDEPENDENT_AMBULATORY_CARE_PROVIDER_SITE_OTHER): Payer: 59 | Admitting: Family Medicine

## 2010-10-31 ENCOUNTER — Encounter: Payer: Self-pay | Admitting: Family Medicine

## 2010-10-31 VITALS — BP 140/85 | HR 93 | Ht 65.0 in | Wt 237.0 lb

## 2010-10-31 DIAGNOSIS — Z0000 Encounter for general adult medical examination without abnormal findings: Secondary | ICD-10-CM

## 2010-10-31 DIAGNOSIS — J309 Allergic rhinitis, unspecified: Secondary | ICD-10-CM | POA: Insufficient documentation

## 2010-10-31 DIAGNOSIS — R1031 Right lower quadrant pain: Secondary | ICD-10-CM

## 2010-10-31 MED ORDER — MONTELUKAST SODIUM 10 MG PO TABS: 10.0000 mg | ORAL_TABLET | Freq: Every day | ORAL | Status: DC

## 2010-10-31 MED ORDER — PRAVASTATIN SODIUM 40 MG PO TABS: 40.0000 mg | ORAL_TABLET | Freq: Every day | ORAL | Status: DC

## 2010-10-31 MED ORDER — FLUTICASONE PROPIONATE 50 MCG/ACT NA SUSP: 2.0000 | Freq: Every day | NASAL | Status: DC

## 2010-10-31 MED ORDER — LISINOPRIL-HYDROCHLOROTHIAZIDE 20-12.5 MG PO TABS: 1.0000 | ORAL_TABLET | Freq: Every day | ORAL | Status: DC

## 2010-10-31 NOTE — Progress Notes (Signed)
Subjective:     Lisa Stanton is a 45 y.o. female and is here for a comprehensive physical exam. The patient reports problems - Pain in hre ovaries. So severe 2 weeks ago she took an old Microbiologist. That didn't help so took and IBU 800mg  and that provided some mild relief.  she had an ultrasound that we scheduled that showed that she either has cysts on her right ovary or has a hydrosalpinx. Because she still continues to have pain we have gone ahead and started scheduled her for an MRI. This of course will require prior authorization with her insurance. She thinks she might of had a previous history of endometriosis and is not clear about this. Most the time her pain is more mild to moderate but she was very upset that she had a severe episode of couple weeks ago that she says we're clearly brought her to tears. No vomiting or diarrhea. She does have a hysterectomy since she has not had periods. No chest pain or soreness around. No blood in the urine or stool. No dysuria.  BP 140/85  Pulse 93  Ht 5\' 5"  (1.651 m)  Wt 237 lb (107.502 kg)  BMI 39.44 kg/m2    No Known Allergies  Past Medical History  Diagnosis Date  . Iron deficiency anemia     Past Surgical History  Procedure Date  . Abdominal hernia repair 09/2010    incarcerated that required surgery.    . Roux-en-y gastric bypass 06/2000  . Partial hysterectomy November 2010  . Carpal tunnel release May 1998    Right hand  . Umbilical hernia repair   . Cholecystectomy February 2003  . Appendectomy February 2004  . Cysts removed September 1993    Left hand  . Knee surgery July 1986    Left knee    History   Social History  . Marital Status: Married    Spouse Name: Aneta Mins    Number of Children: 0  . Years of Education: HS   Occupational History  . Customer Service.  [employer]   Social History Main Topics  . Smoking status: Never Smoker   . Smokeless tobacco: Not on file  . Alcohol Use: No  . Drug Use: Not on  file  . Sexually Active: Not on file   Other Topics Concern  . Not on file   Social History Narrative   2 caffeine per day. Regular exercise, walking.      Family History  Problem Relation Age of Onset  . Osteoporosis Mother   . Rheum arthritis Mother   . Breast cancer Maternal Grandmother   . Colon cancer Maternal Grandfather   . Thyroid cancer Brother 85  . Diabetes Father   . Hypertension Father   . Hyperlipidemia Father     Current outpatient prescriptions:calcium-vitamin D 250-100 MG-UNIT per tablet, Take 1 tablet by mouth 2 (two) times daily.  , Disp: , Rfl: ;  fluticasone (FLONASE) 50 MCG/ACT nasal spray, Place 2 sprays into the nose daily., Disp: 16 g, Rfl: 11;  lisinopril-hydrochlorothiazide (PRINZIDE,ZESTORETIC) 20-12.5 MG per tablet, Take 1 tablet by mouth daily., Disp: 90 tablet, Rfl: 3 montelukast (SINGULAIR) 10 MG tablet, Take 1 tablet (10 mg total) by mouth at bedtime., Disp: 30 tablet, Rfl: 11;  pravastatin (PRAVACHOL) 40 MG tablet, Take 1 tablet (40 mg total) by mouth daily., Disp: 90 tablet, Rfl: 3;  DISCONTD: fluticasone (FLONASE) 50 MCG/ACT nasal spray, Place 2 sprays into the nose daily.  , Disp: ,  Rfl:  DISCONTD: lisinopril-hydrochlorothiazide (PRINZIDE,ZESTORETIC) 20-12.5 MG per tablet, Take 1 tablet by mouth daily.  , Disp: , Rfl: ;  DISCONTD: pravastatin (PRAVACHOL) 40 MG tablet, Take 40 mg by mouth daily.  , Disp: , Rfl: ;  DISCONTD: SINGULAIR 10 MG tablet, TAKE 1 TABLET BY MOUTH ONCE A DAY, Disp: 30 tablet, Rfl: 1  Objective:    BP 140/85  Pulse 93  Ht 5\' 5"  (1.651 m)  Wt 237 lb (107.502 kg)  BMI 39.44 kg/m2 General appearance: alert, cooperative, appears stated age and moderately obese Head: Normocephalic, without obvious abnormality, atraumatic Eyes: normal conjunctiva, PEERLA, EOMi Ears: normal TM's and external ear canals both ears Nose: Nares normal. Septum midline. Mucosa normal. No drainage or sinus tenderness. Throat: lips, mucosa, and tongue  normal; teeth and gums normal Neck: no adenopathy, no carotid bruit, supple, symmetrical, trachea midline and thyroid not enlarged, symmetric, no tenderness/mass/nodules Back: symmetric, no curvature. ROM normal. No CVA tenderness. Lungs: clear to auscultation bilaterally Breasts: normal appearance, no masses or tenderness Heart: regular rate and rhythm, S1, S2 normal, no murmur, click, rub or gallop Abdomen: soft, non-tender; bowel sounds normal; no masses,  no organomegaly Extremities: extremities normal, atraumatic, no cyanosis or edema Pulses: 2+ and symmetric Skin: Skin color, texture, turgor normal. No rashes or lesions Lymph nodes: Cervical, supraclavicular, and axillary nodes normal. Neurologic: Reflexes: 2+ and symmetric    Assessment:    Healthy female exam.    Plan:     See After Visit Summary for Counseling Recommendations  #1 due for screening labs. Lab slip given. #2 she is due for her mammogram and says she started scheduled this for next week. #3 we'll get her scheduled for her MRI of her pelvis to further evaluate the area to see if the cysts on her ovaries or hydrosalpinx. #4 encourage healthy eating and regular exercise. #5 did refill her medications for one year supply. #6 her tetanus vaccine is up-to-date.

## 2010-11-01 ENCOUNTER — Ambulatory Visit
Admission: RE | Admit: 2010-11-01 | Discharge: 2010-11-01 | Disposition: A | Discharge status: Home / Self Care | Payer: 59 | Source: Ambulatory Visit | Attending: Family Medicine | Admitting: Family Medicine

## 2010-11-01 ENCOUNTER — Telehealth: Payer: Self-pay | Admitting: Family Medicine

## 2010-11-01 DIAGNOSIS — Z1231 Encounter for screening mammogram for malignant neoplasm of breast: Secondary | ICD-10-CM

## 2010-11-01 NOTE — Telephone Encounter (Signed)
Auth # for MRI is [id number]. Good for 45 days. [facility] notified. ([address])

## 2010-11-01 NOTE — Telephone Encounter (Signed)
Mri sched for 6/20 at [facility] [address]. They notified pt. Auth given

## 2010-11-02 ENCOUNTER — Other Ambulatory Visit: Payer: 59

## 2010-11-02 ENCOUNTER — Telehealth: Payer: Self-pay | Admitting: Family Medicine

## 2010-11-02 ENCOUNTER — Ambulatory Visit
Admission: RE | Admit: 2010-11-02 | Discharge: 2010-11-02 | Disposition: A | Discharge status: Home / Self Care | Payer: 59 | Source: Ambulatory Visit | Attending: Family Medicine | Admitting: Family Medicine

## 2010-11-02 DIAGNOSIS — R1031 Right lower quadrant pain: Secondary | ICD-10-CM

## 2010-11-02 DIAGNOSIS — N7011 Chronic salpingitis: Secondary | ICD-10-CM

## 2010-11-02 DIAGNOSIS — R102 Pelvic and perineal pain: Secondary | ICD-10-CM

## 2010-11-02 MED ORDER — GADOBENATE DIMEGLUMINE 529 MG/ML IV SOLN
20.0000 mL | Freq: Once | INTRAVENOUS | Status: AC | PRN
  Administered 2010-11-02: 20 mL via INTRAVENOUS

## 2010-11-02 NOTE — Telephone Encounter (Signed)
Pt informed of the results of the MRI.  Does not currently have an OB-GYN.  Prefers the provider to send her to the OB-GYN office her a the [facility]  Plan:  Routed to Dr. Linford Arnold to do referral Jarvis Newcomer, LPN [facility] Dimes

## 2010-11-02 NOTE — Telephone Encounter (Signed)
Called patient: MRI shows a lesion is not on her ovary it is apparently in the tumor near the tube. At this point in time I think it would be important for a GYN surgeon to see her to evaluate if this is something that might need surgical intervention since she is having pain in that area as well. It does not appear to be cancer. Does she have a GYN that she has seen previously when she was asked to sit up and referral here in our building?

## 2010-11-03 ENCOUNTER — Telehealth: Payer: Self-pay | Admitting: Family Medicine

## 2010-11-03 NOTE — Telephone Encounter (Signed)
LMOM advising pt of result 

## 2010-11-03 NOTE — Telephone Encounter (Signed)
Please call patient. Normal mammogram.  Repeat in 1 year.  

## 2010-11-04 ENCOUNTER — Telehealth: Payer: Self-pay | Admitting: Family Medicine

## 2010-11-04 LAB — COMPLETE METABOLIC PANEL WITH GFR
ALT: 15 U/L (ref 0–35)
AST: 13 U/L (ref 0–37)
Albumin: 3.8 g/dL (ref 3.5–5.2)
CO2: 27 mEq/L (ref 19–32)
Calcium: 9.3 mg/dL (ref 8.4–10.5)
Chloride: 103 mEq/L (ref 96–112)
Creat: 0.64 mg/dL (ref 0.50–1.10)
GFR, Est African American: >60 mL/min (ref >60)
Potassium: 4.3 mEq/L (ref 3.5–5.3)
Sodium: 139 mEq/L (ref 135–145)
Total Protein: 6.7 g/dL (ref 6.0–8.3)

## 2010-11-04 LAB — LIPID PANEL
LDL Cholesterol: 109 mg/dL — ABNORMAL HIGH (ref 0–99)
Triglycerides: 188 mg/dL — ABNORMAL HIGH (ref <150)
VLDL: 38 mg/dL (ref 0–40)

## 2010-11-04 MED ORDER — PRAVASTATIN SODIUM 80 MG PO TABS: 80.0000 mg | ORAL_TABLET | Freq: Every day | ORAL | Status: DC

## 2010-11-04 NOTE — Telephone Encounter (Signed)
Pt informed of her lab results.  LMOM with details.  Told to repeat in 6 mths.  Told to increase pravastatin to 80 mg. Jarvis Newcomer, LPN [facility] Dimes

## 2010-11-04 NOTE — Telephone Encounter (Signed)
Called patient: Complete metabolic panel and thyroid are normal. Overall her cholesterol looks better than last. Good work. Her LDL is the metastatic goal. I would like to increase her pravastatin to 80 mg. And recheck levels in 6 months.

## 2010-11-07 ENCOUNTER — Telehealth: Payer: Self-pay | Admitting: Family Medicine

## 2010-11-07 NOTE — Telephone Encounter (Signed)
Pt called and is requesting to be sent to somewhere other than [facility] for her referral appt to OB-GYN.  Pt prefers to go here at the [facility]. Plan:  Routed to [facility] and will ask her to call the patient @ [phone]. Jarvis Newcomer, LPN Dom[facility] Dimes

## 2010-11-08 ENCOUNTER — Telehealth: Payer: Self-pay | Admitting: Family Medicine

## 2010-11-08 MED ORDER — PRAVASTATIN SODIUM 80 MG PO TABS: 80.0000 mg | ORAL_TABLET | Freq: Every day | ORAL | Status: DC

## 2010-11-08 NOTE — Telephone Encounter (Signed)
Pts referral has been taken care of .  July 10, 12 with Dr. Penne Lash at [facility]. Jarvis Newcomer, LPN Dom[facility] Dimes

## 2010-11-17 ENCOUNTER — Ambulatory Visit: Payer: 59 | Admitting: Gynecology

## 2010-11-22 ENCOUNTER — Ambulatory Visit (INDEPENDENT_AMBULATORY_CARE_PROVIDER_SITE_OTHER): Payer: 59 | Admitting: Obstetrics & Gynecology

## 2010-11-22 ENCOUNTER — Encounter: Payer: Self-pay | Admitting: Obstetrics & Gynecology

## 2010-11-22 VITALS — BP 120/77 | HR 80 | Ht 63.75 in | Wt 233.0 lb

## 2010-11-22 DIAGNOSIS — R102 Pelvic and perineal pain: Secondary | ICD-10-CM | POA: Insufficient documentation

## 2010-11-22 DIAGNOSIS — Z3049 Encounter for surveillance of other contraceptives: Secondary | ICD-10-CM

## 2010-11-22 DIAGNOSIS — N949 Unspecified condition associated with female genital organs and menstrual cycle: Secondary | ICD-10-CM

## 2010-11-22 DIAGNOSIS — N9489 Other specified conditions associated with female genital organs and menstrual cycle: Secondary | ICD-10-CM

## 2010-11-22 MED ORDER — MEDROXYPROGESTERONE ACETATE 150 MG/ML IM SUSP
150.0000 mg | Freq: Once | INTRAMUSCULAR | Status: AC
  Administered 2010-11-22: 150 mg via INTRAMUSCULAR

## 2010-11-22 NOTE — Patient Instructions (Signed)
Depo every 12 weeks

## 2010-11-22 NOTE — Progress Notes (Signed)
  S:  Pt complaining of right sided abdominal pain at the end of each month before what would be here period but she has had a hysterectomy.  Pain does not occur other than just once a month.  Pt had [name] and MRI outpt which showed probable rt hydrosalpinx 3 x 6 cm.  Ovaries are nml.  MRI rads suggested f/u scan in 6 months.  ROS:  Pain with urination if bladder becomes very full; otherwise negative.  O: AFVSS HEENT:  NCAT Chest:  nml mvmt Abd:  Mulitple scars, no pain, ND Ext:  No edema Genatalia:  Tanner V Vagina:  Pink, cuff intact Bimanual:  NO pain on deep palpation  A/P  45 yo female with rt probable hydrosalpinx and monthly pain.  ? Mittelschmerz?  1.  Inhibit ovulation with Depo.  Pt has been on depo before and knows side effects 2.  If Depo doesn't work, consider a trial of Lupron to see if this helps pain (r/o endometriosis) 3.  Consider surgery as last resort given large mesh across abdomen and prior abdominoplasty 4.  RTC in 12 weeks 5.  Health maintenace by Dr. Darra Lis

## 2010-12-13 ENCOUNTER — Other Ambulatory Visit: Payer: Self-pay | Admitting: Family Medicine

## 2010-12-13 NOTE — Telephone Encounter (Signed)
Pt called and was inquiring about her pravastatin prescription.  She had been seen recently had labs and her pravastatin had been increased.  A script was sent over today for the wrong(old dose for 40 mg), and  The pravastatin script had already been sent on 11-08-10 for #90 and 1 refill for the 80 mg dose.  Cancelled the script sent today and since [facility] didn't have the script that had originally been sent correctly from 11-08-10; it was given as a verbal order to the pharmacist today at [facility]. Jarvis Newcomer, LPN Dom[facility] Dimes

## 2011-02-09 ENCOUNTER — Ambulatory Visit: Payer: 59

## 2011-03-07 DIAGNOSIS — Z90710 Acquired absence of both cervix and uterus: Secondary | ICD-10-CM | POA: Insufficient documentation

## 2011-03-07 DIAGNOSIS — K432 Incisional hernia without obstruction or gangrene: Secondary | ICD-10-CM | POA: Insufficient documentation

## 2011-03-07 DIAGNOSIS — Z9884 Bariatric surgery status: Secondary | ICD-10-CM | POA: Insufficient documentation

## 2013-10-07 ENCOUNTER — Emergency Department (INDEPENDENT_AMBULATORY_CARE_PROVIDER_SITE_OTHER)
Admission: EM | Admit: 2013-10-07 | Discharge: 2013-10-07 | Disposition: A | Discharge status: Home / Self Care | Payer: BC Managed Care – PPO | Source: Home / Self Care

## 2013-10-07 ENCOUNTER — Encounter: Payer: Self-pay | Admitting: Emergency Medicine

## 2013-10-07 DIAGNOSIS — J069 Acute upper respiratory infection, unspecified: Secondary | ICD-10-CM

## 2013-10-07 DIAGNOSIS — J45909 Unspecified asthma, uncomplicated: Secondary | ICD-10-CM

## 2013-10-07 DIAGNOSIS — I10 Essential (primary) hypertension: Secondary | ICD-10-CM

## 2013-10-07 MED ORDER — AZITHROMYCIN 250 MG PO TABS: ORAL_TABLET | ORAL | Status: DC

## 2013-10-07 MED ORDER — HYDROCODONE-HOMATROPINE 5-1.5 MG/5ML PO SYRP: 5.0000 mL | ORAL_SOLUTION | Freq: Three times a day (TID) | ORAL | Status: DC | PRN

## 2013-10-07 MED ORDER — PREDNISONE 50 MG PO TABS: ORAL_TABLET | ORAL | Status: DC

## 2013-10-07 MED ORDER — IPRATROPIUM-ALBUTEROL 0.5-2.5 (3) MG/3ML IN SOLN
3.0000 mL | Freq: Once | RESPIRATORY_TRACT | Status: AC
  Administered 2013-10-07: 3 mL via RESPIRATORY_TRACT

## 2013-10-07 MED ORDER — IPRATROPIUM-ALBUTEROL 0.5-2.5 (3) MG/3ML IN SOLN: 3.0000 mL | RESPIRATORY_TRACT | Status: DC

## 2013-10-07 MED ORDER — ALBUTEROL SULFATE HFA 108 (90 BASE) MCG/ACT IN AERS: 2.0000 | INHALATION_SPRAY | Freq: Four times a day (QID) | RESPIRATORY_TRACT | Status: DC | PRN

## 2013-10-07 NOTE — ED Provider Notes (Signed)
CSN: [id number]     Arrival date & time 10/07/13  1349 History   None    Chief Complaint  Patient presents with  . URI   (Consider location/radiation/quality/duration/timing/severity/associated sxs/prior Treatment) HPI Pt is a 48 yo female who presents to the clinic productive cough, SOB, ST, sinus pressure, and wheezing. She just feels tired and run down. She denies any fevers. Symptoms have been persistent for 5 days. Sputum is thick and yellow. She is taking mucinex, decongestant, and cough suppressant. He had hernia surgery and coughing so hard that she can feel her muscles bulging when she coughs. Cough is the worst at night. She has had an every day cough for couple of months that has been dry but this is different. She has been on lisinopril/HCTZ for over 10 years. She does admit to having allergies. No dx of asthma. Pt does not smoke.   Past Medical History  Diagnosis Date  . Iron deficiency anemia   . Arthritis   . Murmur   . Hypertension   . Hyperlipidemia    Past Surgical History  Procedure Laterality Date  . Abdominal hernia repair  09/2010    incarcerated that required surgery.    . Roux-en-y gastric bypass  06/2000  . Partial hysterectomy  November 2010  . Carpal tunnel release  May 1998    Right hand  . Umbilical hernia repair    . Cholecystectomy  February 2003  . Appendectomy  February 2004  . Cysts removed  September 1993    Left hand  . Knee surgery  July 1986    Left knee  . Abdominoplasty  2006  . Hernia repair    . Abdominal hysterectomy     Family History  Problem Relation Age of Onset  . Osteoporosis Mother   . Rheum arthritis Mother   . Breast cancer Maternal Grandmother   . Colon cancer Maternal Grandfather   . Thyroid cancer Brother 79  . Diabetes Father   . Hypertension Father   . Hyperlipidemia Father   . Diabetes Paternal Grandfather   . Heart disease Paternal Grandfather    History  Substance Use Topics  . Smoking status: Never Smoker    . Smokeless tobacco: Not on file  . Alcohol Use: Yes     Comment: 6 times/year   OB History   Grav Para Term Preterm Abortions TAB SAB Ect Mult Living   3 0   3 2 1         Review of Systems  All other systems reviewed and are negative.   Allergies  Review of patient's allergies indicates no known allergies.  Home Medications   Prior to Admission medications   Medication Sig Start Date End Date Taking? Authorizing Provider  calcium-vitamin D 250-100 MG-UNIT per tablet Take 1 tablet by mouth 2 (two) times daily.      Historical Provider, MD  Cyanocobalamin (VITAMIN B-12 CR PO) Take 1 tablet by mouth daily.      Historical Provider, MD  fluticasone (FLONASE) 50 MCG/ACT nasal spray Place 2 sprays into the nose daily. 10/31/10   Hali Marry, MD  lisinopril-hydrochlorothiazide (PRINZIDE,ZESTORETIC) 20-12.5 MG per tablet Take 1 tablet by mouth daily. 10/31/10   Hali Marry, MD  montelukast (SINGULAIR) 10 MG tablet Take 1 tablet (10 mg total) by mouth at bedtime. 10/31/10   Hali Marry, MD  Multiple Vitamin (MULTIVITAMIN) tablet Take 1 tablet by mouth daily.      Historical Provider, MD  pravastatin (PRAVACHOL) 80 MG tablet Take 1 tablet (80 mg total) by mouth daily. 11/08/10   Hali Marry, MD   BP 151/91  Pulse 98  Temp(Src) 98.2 F (36.8 C) (Oral)  Ht 5\' 5"  (1.651 m)  Wt 264 lb (119.75 kg)  BMI 43.93 kg/m2  SpO2 96%  LMP 03/15/2009 Physical Exam  Constitutional: She is oriented to person, place, and time. She appears well-developed and well-nourished.  HENT:  Head: Normocephalic and atraumatic.  Right Ear: External ear normal.  Left Ear: External ear normal.  Nose: Nose normal.  Mouth/Throat: Oropharynx is clear and moist.  Eyes: Conjunctivae are normal. Right eye exhibits no discharge. Left eye exhibits no discharge.  Neck: Normal range of motion. Neck supple.  Cardiovascular: Normal rate, regular rhythm and normal heart sounds.    Pulmonary/Chest:  Bilateral wheezing throughout bilateral lungs.   Lymphadenopathy:    She has no cervical adenopathy.  Neurological: She is alert and oriented to person, place, and time.  Skin: Skin is dry.  Psychiatric: She has a normal mood and affect. Her behavior is normal.    ED Course  Procedures (including critical care time) Labs Review Labs Reviewed - No data to display  Imaging Review No results found.   MDM   1. Asthmatic bronchitis   2. URI (upper respiratory infection)   3. Essential hypertension, benign    Duoneb was given in office today. Significant improvement.  Pt was given zpak and prednisone for 5 days.  Albuterol inhaler given to use as needed every 4-6 hours for SOB/Wheezing.  BP is elevated today. Likely due to cough. Pt advised to find PCP and have rechecked in 2 weeks.  Discussed ongoing cough. Hycodan given to use at night. Cough can be due to many different variants. If not improving after current therapy follow up with PCP. Pt is on Ace but been on for years. May need to consider stopping.     Donella Stade, PA-C 10/07/13 1500

## 2013-10-07 NOTE — ED Notes (Signed)
C/o productive with yellow sputum.  Has pain around eyes, SOB esp when lying down. Can hear a wheezing/rattle in throat esp when lying down. States she has had a low grade fever not to exceed 100.7.

## 2013-10-07 NOTE — Discharge Instructions (Signed)
Albuterol every 4-6 hours 2 puffs.  Zpak for 5 days.  Prednisone for 5 days.  F/U with pCP for ongoing cough and BP.   Bronchitis Bronchitis is inflammation of the airways that extend from the windpipe into the lungs (bronchi). The inflammation often causes mucus to develop, which leads to a cough. If the inflammation becomes severe, it may cause shortness of breath. CAUSES  Bronchitis may be caused by:   Viral infections.   Bacteria.   Cigarette smoke.   Allergens, pollutants, and other irritants.  SIGNS AND SYMPTOMS  The most common symptom of bronchitis is a frequent cough that produces mucus. Other symptoms include:  Fever.   Body aches.   Chest congestion.   Chills.   Shortness of breath.   Sore throat.  DIAGNOSIS  Bronchitis is usually diagnosed through a medical history and physical exam. Tests, such as chest X-rays, are sometimes done to rule out other conditions.  TREATMENT  You may need to avoid contact with whatever caused the problem (smoking, for example). Medicines are sometimes needed. These may include:  Antibiotics. These may be prescribed if the condition is caused by bacteria.  Cough suppressants. These may be prescribed for relief of cough symptoms.   Inhaled medicines. These may be prescribed to help open your airways and make it easier for you to breathe.   Steroid medicines. These may be prescribed for those with recurrent (chronic) bronchitis. HOME CARE INSTRUCTIONS  Get plenty of rest.   Drink enough fluids to keep your urine clear or pale yellow (unless you have a medical condition that requires fluid restriction). Increasing fluids may help thin your secretions and will prevent dehydration.   Only take over-the-counter or prescription medicines as directed by your health care provider.  Only take antibiotics as directed. Make sure you finish them even if you start to feel better.  Avoid secondhand smoke, irritating  chemicals, and strong fumes. These will make bronchitis worse. If you are a smoker, quit smoking. Consider using nicotine gum or skin patches to help control withdrawal symptoms. Quitting smoking will help your lungs heal faster.   Put a cool-mist humidifier in your bedroom at night to moisten the air. This may help loosen mucus. Change the water in the humidifier daily. You can also run the hot water in your shower and sit in the bathroom with the door closed for 5 10 minutes.   Follow up with your health care provider as directed.   Wash your hands frequently to avoid catching bronchitis again or spreading an infection to others.  SEEK MEDICAL CARE IF: Your symptoms do not improve after 1 week of treatment.  SEEK IMMEDIATE MEDICAL CARE IF:  Your fever increases.  You have chills.   You have chest pain.   You have worsening shortness of breath.   You have bloody sputum.  You faint.  You have lightheadedness.  You have a severe headache.   You vomit repeatedly. MAKE SURE YOU:   Understand these instructions.  Will watch your condition.  Will get help right away if you are not doing well or get worse. Document Released: 05/01/2005 Document Revised: 02/19/2013 Document Reviewed: 12/24/2012 [facility] Patient Information 2014 [place].

## 2013-10-08 NOTE — ED Provider Notes (Signed)
Agree with exam, assessment, and plan.   Kandra Nicolas, MD 10/08/13 [phone]

## 2013-11-19 ENCOUNTER — Ambulatory Visit (INDEPENDENT_AMBULATORY_CARE_PROVIDER_SITE_OTHER): Payer: BC Managed Care – PPO | Admitting: Physician Assistant

## 2013-11-19 ENCOUNTER — Encounter: Payer: Self-pay | Admitting: Physician Assistant

## 2013-11-19 VITALS — BP 155/75 | HR 92 | Ht 65.0 in | Wt 267.0 lb

## 2013-11-19 DIAGNOSIS — E785 Hyperlipidemia, unspecified: Secondary | ICD-10-CM | POA: Insufficient documentation

## 2013-11-19 DIAGNOSIS — Z131 Encounter for screening for diabetes mellitus: Secondary | ICD-10-CM

## 2013-11-19 DIAGNOSIS — J302 Other seasonal allergic rhinitis: Secondary | ICD-10-CM | POA: Insufficient documentation

## 2013-11-19 DIAGNOSIS — I10 Essential (primary) hypertension: Secondary | ICD-10-CM | POA: Insufficient documentation

## 2013-11-19 DIAGNOSIS — J309 Allergic rhinitis, unspecified: Secondary | ICD-10-CM

## 2013-11-19 MED ORDER — MONTELUKAST SODIUM 10 MG PO TABS: 10.0000 mg | ORAL_TABLET | Freq: Every day | ORAL | Status: DC

## 2013-11-19 MED ORDER — LOSARTAN POTASSIUM-HCTZ 100-25 MG PO TABS: 1.0000 | ORAL_TABLET | Freq: Every day | ORAL | Status: DC

## 2013-11-19 MED ORDER — MEDROXYPROGESTERONE ACETATE 10 MG PO TABS: 10.0000 mg | ORAL_TABLET | Freq: Every day | ORAL | Status: DC

## 2013-11-19 MED ORDER — PRAVASTATIN SODIUM 80 MG PO TABS: 40.0000 mg | ORAL_TABLET | Freq: Every day | ORAL | Status: DC

## 2013-11-19 NOTE — Progress Notes (Signed)
Subjective:    Patient ID: Lisa Stanton, female    DOB: 02/04/1966, 48 y.o.   MRN: [id number]  HPI Patient is a 48 year old female who presents to the clinic to establish care. She's been seen in the clinic before but it was at least 3 years ago before she moved to [place].  .. Active Ambulatory Problems    Diagnosis Date Noted  . HYPERLIPIDEMIA 05/14/2009  . ANEMIA-IRON DEFICIENCY 05/14/2009  . HYPERTENSION 05/14/2009  . PELVIC  PAIN 03/14/2010  . Allergic rhinitis 10/31/2010  . Adnexal pain 11/22/2010  . Seasonal allergies 11/19/2013  . Hyperlipemia 11/19/2013  . Essential hypertension, benign 11/19/2013   Resolved Ambulatory Problems    Diagnosis Date Noted  . EUSTACHIAN TUBE DYSFUNCTION 05/14/2009  . DIZZINESS 09/07/2009   Past Medical History  Diagnosis Date  . Iron deficiency anemia   . Arthritis   . Murmur   . Hypertension   . Hyperlipidemia    .[name] Family History  Problem Relation Age of Onset  . Osteoporosis Mother   . Rheum arthritis Mother   . Breast cancer Maternal Grandmother   . Colon cancer Maternal Grandfather   . Thyroid cancer Brother 38  . Diabetes Father   . Hypertension Father   . Hyperlipidemia Father   . Diabetes Paternal Grandfather   . Heart disease Paternal Grandfather    .[name] History   Social History  . Marital Status: Married    Spouse Name: Doren Custard    Number of Children: 0  . 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 Smoking status: Never Smoker   . Smokeless tobacco: Not on file  . Alcohol Use: Yes     Comment: 6 times/year  . Drug Use: No  . Sexual Activity: Yes    Partners: Male   Other Topics Concern  . Not on file   Social History Narrative   2 caffeine per day. Regular exercise, walking.     Patient does need refills today on her ongoing medication.  Hypertension-she was seen in urgent care by myself about one month ago and placed  on blood pressure indication lisinopril HCTZ. She has had a dry cough since and not felt just right. She denies any chest pains, palpitations, headache or vision changes.  Hyperlipidemia-she does it has been a while since her cholesterol was checked. She does take the pravastatin daily.  Adnexal pain-daily progesterone helps keeps her ovarian cyst under control. Currently she is usingnorethindrone but it is very expensive right now I would like to switch.  Seasonal allergies/allergic rhinitis-patient currently uses Singulair and Claritin dose daily. Currently symptoms are controlled.   Review of Systems  All other systems reviewed and are negative.      Objective:   Physical Exam  Constitutional: She is oriented to person, place, and time. She appears well-developed and well-nourished.  Obese.   HENT:  Head: Normocephalic and atraumatic.  Cardiovascular: Normal rate, regular rhythm and normal heart sounds.   Pulmonary/Chest: Effort normal and breath sounds normal. She has no wheezes.  Neurological: She is alert and oriented to person, place, and time.  Skin: Skin is dry.  Psychiatric: She has a normal mood and affect. Her behavior is normal.          Assessment & Plan:  Hypertension-blood pressure not controlled today as well as lisinopril causing patient to have dry cough and not feel quite right. Will switch  to Hyzaar 100 mg over 25 mg of HCTZ daily. Will followup with blood pressure in 4 weeks. Discussed importance of weight loss and low salt diet and blood pressure management.  Hyperlipidemia- gave patient lab slip to have fasting labs drawn at her convenience in the next couple of weeks. Refilled her current prescription of pravastatin 40 mg daily for the next 3 months.  Seasonal allergies-refilled singulair for daily use. Patient is currently taking loratadine over-the-counter in combination with. She occasionally does still wheezy and needs to use albuterol inhaler.  Discussed with her that is okay as long as if she started to use regularly she has a followup appointment to discuss asthma symptoms. Follow up as needed.  Adnexal pain-well controlled with progesterone daily. Will switch due to cost from norethindrone to medroxyprogesterone acetate 10mg  daily.    Discussed need for mammogram. Patient reports that she gets mammograms 3 at work. We'll wait.

## 2013-11-27 LAB — LIPID PANEL
CHOL/HDL RATIO: 4.9 ratio
Cholesterol: 182 mg/dL (ref 0–200)
HDL: 37 mg/dL — ABNORMAL LOW (ref >39)
LDL Cholesterol: 116 mg/dL — ABNORMAL HIGH (ref 0–99)
TRIGLYCERIDES: 145 mg/dL (ref <150)
VLDL: 29 mg/dL (ref 0–40)

## 2013-11-27 LAB — COMPLETE METABOLIC PANEL WITH GFR
ALT: 39 U/L — ABNORMAL HIGH (ref 0–35)
AST: 34 U/L (ref 0–37)
Albumin: 4 g/dL (ref 3.5–5.2)
Alkaline Phosphatase: 68 U/L (ref 39–117)
BILIRUBIN TOTAL: 0.5 mg/dL (ref 0.2–1.2)
BUN: 14 mg/dL (ref 6–23)
CO2: 29 meq/L (ref 19–32)
Calcium: 9.3 mg/dL (ref 8.4–10.5)
Chloride: 104 mEq/L (ref 96–112)
Creat: 0.64 mg/dL (ref 0.50–1.10)
GLUCOSE: 104 mg/dL — AB (ref 70–99)
Potassium: 4.3 mEq/L (ref 3.5–5.3)
Sodium: 142 mEq/L (ref 135–145)
TOTAL PROTEIN: 6.5 g/dL (ref 6.0–8.3)

## 2014-01-28 ENCOUNTER — Encounter: Payer: Self-pay | Admitting: Physician Assistant

## 2014-01-28 ENCOUNTER — Ambulatory Visit (INDEPENDENT_AMBULATORY_CARE_PROVIDER_SITE_OTHER): Payer: BC Managed Care – PPO | Admitting: Physician Assistant

## 2014-01-28 ENCOUNTER — Ambulatory Visit: Payer: BC Managed Care – PPO | Admitting: Physician Assistant

## 2014-01-28 VITALS — BP 168/75 | HR 88 | Ht 65.0 in | Wt 267.0 lb

## 2014-01-28 DIAGNOSIS — I10 Essential (primary) hypertension: Secondary | ICD-10-CM | POA: Diagnosis not present

## 2014-01-28 DIAGNOSIS — R7309 Other abnormal glucose: Secondary | ICD-10-CM

## 2014-01-28 DIAGNOSIS — R059 Cough, unspecified: Secondary | ICD-10-CM

## 2014-01-28 DIAGNOSIS — R7301 Impaired fasting glucose: Secondary | ICD-10-CM

## 2014-01-28 DIAGNOSIS — R05 Cough: Secondary | ICD-10-CM

## 2014-01-28 DIAGNOSIS — Z23 Encounter for immunization: Secondary | ICD-10-CM | POA: Diagnosis not present

## 2014-01-28 DIAGNOSIS — R7303 Prediabetes: Secondary | ICD-10-CM

## 2014-01-28 DIAGNOSIS — R748 Abnormal levels of other serum enzymes: Secondary | ICD-10-CM

## 2014-01-28 DIAGNOSIS — Z0000 Encounter for general adult medical examination without abnormal findings: Secondary | ICD-10-CM

## 2014-01-28 DIAGNOSIS — Z1239 Encounter for other screening for malignant neoplasm of breast: Secondary | ICD-10-CM

## 2014-01-28 LAB — HEPATIC FUNCTION PANEL
ALT: 56 U/L — AB (ref 0–35)
AST: 34 U/L (ref 0–37)
Albumin: 4 g/dL (ref 3.5–5.2)
Alkaline Phosphatase: 84 U/L (ref 39–117)
BILIRUBIN DIRECT: 0.1 mg/dL (ref 0.0–0.3)
BILIRUBIN INDIRECT: 0.3 mg/dL (ref 0.2–1.2)
TOTAL PROTEIN: 6.8 g/dL (ref 6.0–8.3)
Total Bilirubin: 0.4 mg/dL (ref 0.2–1.2)

## 2014-01-28 LAB — POCT GLYCOSYLATED HEMOGLOBIN (HGB A1C): HEMOGLOBIN A1C: 6.2

## 2014-01-28 MED ORDER — PRAVASTATIN SODIUM 80 MG PO TABS: ORAL_TABLET | ORAL | Status: DC

## 2014-01-28 MED ORDER — AZILSARTAN-CHLORTHALIDONE 40-12.5 MG PO TABS: ORAL_TABLET | ORAL | Status: DC

## 2014-01-28 MED ORDER — VALACYCLOVIR HCL 1 G PO TABS: ORAL_TABLET | ORAL | Status: DC

## 2014-01-28 NOTE — Patient Instructions (Addendum)
For constipation- miralax 1 capful daily as needed. Increase fiber. Increase water.  HTN- try edarbyclor daily.   Keeping You Healthy  Get These Tests 1. Blood Pressure- Have your blood pressure checked once a year by your health care provider.  Normal blood pressure is 120/80. 2. Weight- Have your body mass index (BMI) calculated to screen for obesity.  BMI is measure of body fat based on height and weight.  You can also calculate your own BMI at GravelBags.it. 3. Cholesterol- Have your cholesterol checked every 5 years starting at age 44 then yearly starting at age 24. 23. Chlamydia, HIV, and other sexually transmitted diseases- Get screened every year until age 42, then within three months of each new sexual provider. 5. Pap Smear- Every 1-3 years; discuss with your health care provider. 6. Mammogram- Every year starting at age 41  Take these medicines  Calcium with Vitamin D-Your body needs 1200 mg of Calcium each day and [phone] IU of Vitamin D daily.  Your body can only absorb 500 mg of Calcium at a time so Calcium must be taken in 2 or 3 divided doses throughout the day.  Multivitamin with folic acid- Once daily if it is possible for you to become pregnant.  Get these Immunizations  Gardasil-Series of three doses; prevents HPV related illness such as genital warts and cervical cancer.  Menactra-Single dose; prevents meningitis.  Tetanus shot- Every 10 years.  Flu shot-Every year.  Take these steps 1. Do not smoke-Your healthcare provider can help you quit.  For tips on how to quit go to www.smokefree.gov or call 1-800 QUITNOW. 2. Be physically active- Exercise 5 days a week for at least 30 minutes.  If you are not already physically active, start slow and gradually work up to 30 minutes of moderate physical activity.  Examples of moderate activity include walking briskly, dancing, swimming, bicycling, etc. 3. Breast Cancer- A self breast exam every month is  important for early detection of breast cancer.  For more information and instruction on self breast exams, ask your healthcare provider or https://www.patel.info/. 4. Eat a healthy diet- Eat a variety of healthy foods such as fruits, vegetables, whole grains, low fat milk, low fat cheeses, yogurt, lean meats, poultry and fish, beans, nuts, tofu, etc.  For more information go to www. Thenutritionsource.org 5. Drink alcohol in moderation- Limit alcohol intake to one drink or less per day. Never drink and drive. 6. Depression- Your emotional health is as important as your physical health.  If you're feeling down or losing interest in things you normally enjoy please talk to your healthcare provider about being screened for depression. 7. Dental visit- Brush and floss your teeth twice daily; visit your dentist twice a year. 8. Eye doctor- Get an eye exam at least every 2 years. 9. Helmet use- Always wear a helmet when riding a bicycle, motorcycle, rollerblading or skateboarding. 69. Safe sex- If you may be exposed to sexually transmitted infections, use a condom. 11. Seat belts- Seat belts can save your live; always wear one. 12. Smoke/Carbon Monoxide detectors- These detectors need to be installed on the appropriate level of your home. Replace batteries at least once a year. 13. Skin cancer- When out in the sun please cover up and use sunscreen 15 SPF or higher. 14. Violence- If anyone is threatening or hurting you, please tell your healthcare provider.

## 2014-02-01 DIAGNOSIS — R059 Cough, unspecified: Secondary | ICD-10-CM | POA: Insufficient documentation

## 2014-02-01 DIAGNOSIS — R7303 Prediabetes: Secondary | ICD-10-CM | POA: Insufficient documentation

## 2014-02-01 DIAGNOSIS — R05 Cough: Secondary | ICD-10-CM | POA: Insufficient documentation

## 2014-02-01 NOTE — Progress Notes (Signed)
  Subjective:     Lisa Stanton is a 48 y.o. female and is here for a comprehensive physical exam. The patient reports problems - still having cough. not as bad. mostly in am and at night. albuterol dose help. changed lisinopril and helped a little as well. [name]  History   Social History  . Marital Status: Married    Spouse Name: Doren Custard    Number of Children: 0  . 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 Smoking status: Never Smoker   . Smokeless tobacco: Not on file  . Alcohol Use: Yes     Comment: 6 times/year  . Drug Use: No  . Sexual Activity: Yes    Partners: Male   Other Topics Concern  . Not on file   Social History Narrative   2 caffeine per day. Regular exercise, walking.     Health Maintenance  Topic Date Due  . Mammogram  11/01/2011  . Influenza Vaccine  12/14/2014  . Tetanus/tdap  12/10/2019    The following portions of the patient's history were reviewed and updated as appropriate: allergies, current medications, past family history, past medical history, past social history, past surgical history and problem list.  Review of Systems A comprehensive review of systems was negative.   Objective:    BP 168/75  Pulse 88  Ht 5\' 5"  (1.651 m)  Wt 267 lb (121.11 kg)  BMI 44.43 kg/m2  LMP 03/15/2009 General appearance: alert and cooperative obese Head: Normocephalic, without obvious abnormality, atraumatic Eyes: conjunctivae/corneas clear. PERRL, EOM's intact. Fundi benign. Ears: normal TM's and external ear canals both ears Nose: Nares normal. Septum midline. Mucosa normal. No drainage or sinus tenderness. Throat: lips, mucosa, and tongue normal; teeth and gums normal Neck: no adenopathy, no carotid bruit, no JVD, supple, symmetrical, trachea midline and thyroid not enlarged, symmetric, no tenderness/mass/nodules Back: symmetric, no curvature. ROM normal. No CVA  tenderness. Lungs: clear to auscultation bilaterally Heart: regular rate and rhythm, S1, S2 normal, no murmur, click, rub or gallop Abdomen: soft, non-tender; bowel sounds normal; no masses,  no organomegaly Extremities: extremities normal, atraumatic, no cyanosis or edema Pulses: 2+ and symmetric Skin: Skin color, texture, turgor normal. No rashes or lesions Lymph nodes: Cervical, supraclavicular, and axillary nodes normal. Neurologic: Grossly normal    Assessment:    Healthy female exam.      Plan:    CPE- flu shot given without complication. Pap up to date due to hysterectomy. Mammogram to be ordered. Discussed need for weight loss. Labs reviewed. Encouraged exercise at least 150 minutes a week. For occasional constipation discussed miralax usage 1 capful daily as needed.   Elevated liver enzymes- will recheck.   Impaired fasting glucose- .[name] Lab Results  Component Value Date   HGBA1C 6.2 01/28/2014   Discussed pre-diabetes range. Disease is progressive if not stopped. Discussed diet and weight control. Offered nutritionist. Pt declined. Recheck in 6 months.   HTN- will change to edarbiclor. Follow up in 4 weeks.   Cough- need spirometry at this point. Please call to schedule.  Continue to use albuterol as needed. Continue allergy medication. Add flonase/nasorcort.  See After Visit Summary for Counseling Recommendations

## 2014-02-02 ENCOUNTER — Other Ambulatory Visit: Payer: Self-pay | Admitting: Physician Assistant

## 2014-02-02 DIAGNOSIS — R748 Abnormal levels of other serum enzymes: Secondary | ICD-10-CM

## 2014-02-03 ENCOUNTER — Telehealth: Payer: Self-pay | Admitting: *Deleted

## 2014-02-03 ENCOUNTER — Other Ambulatory Visit: Payer: Self-pay | Admitting: Physician Assistant

## 2014-02-03 DIAGNOSIS — R748 Abnormal levels of other serum enzymes: Secondary | ICD-10-CM

## 2014-02-03 NOTE — Telephone Encounter (Signed)
So the reason we decided to increase pravastatin was not due to LDL numbers but due to new dx of pre-diabetes. As sugars worsens increased cardio risk and goals change. Where they were LdL goal of 100 now they are 70.   But yes pravastatin can increase LDL levels but so can fatty liver. Liver enzymes are not exteremly elevated and does not warrant coming off statin yet due to the tremendous benefit of statin. That is why we are working up everything. To get a complete picture.

## 2014-02-03 NOTE — Telephone Encounter (Signed)
Labs ordered for [facility].

## 2014-02-03 NOTE — Telephone Encounter (Signed)
Pt called back & left vm asking if the increase in her pravastatin could cause her liver enzymes to go up?  She stated that you increased it to 80 mg when she was here last but she hasn't started that one, she's still taking 40 mg.  She states that she didn't think her numbers were bad enough to increase to 80.  Please advise.

## 2014-02-05 NOTE — Telephone Encounter (Signed)
Pt notified.  She's been doing some research on her own & she's come to the conclusion that her numbers are bad because she's overweight.  She has downloaded the my fitness pal app onto her phone to help.  She also read that fish oil can deposit fat on your liver & she's been taking that for years and is going to stop it now.  She wants to hold off on the ultrasound right now due to cost and work on diet changes and lifestyle changes first.

## 2014-02-05 NOTE — Telephone Encounter (Signed)
Certainly she is right about weight. Weight is the single biggest cause of fatty liver and as well as elevated TG and LDL. Ok will plan just need to keep an eye on numbers to make sure not progressively getting worse.

## 2014-02-06 LAB — HEPATITIS PANEL, ACUTE
HBsAg Conf: NEGATIVE
HEP A IGM INTERP: NEGATIVE
Hep B C IgM Interp: NEGATIVE
Hep C Virus Ab: <0.1

## 2014-02-09 NOTE — Telephone Encounter (Signed)
Hepatitis panel was negative. Please make pt aware.

## 2014-02-09 NOTE — Telephone Encounter (Signed)
Pt notified of results

## 2014-02-12 ENCOUNTER — Ambulatory Visit (INDEPENDENT_AMBULATORY_CARE_PROVIDER_SITE_OTHER): Payer: BC Managed Care – PPO

## 2014-02-12 DIAGNOSIS — Z1239 Encounter for other screening for malignant neoplasm of breast: Secondary | ICD-10-CM

## 2014-02-18 ENCOUNTER — Ambulatory Visit (INDEPENDENT_AMBULATORY_CARE_PROVIDER_SITE_OTHER): Payer: BC Managed Care – PPO | Admitting: Physician Assistant

## 2014-02-18 ENCOUNTER — Encounter: Payer: Self-pay | Admitting: Physician Assistant

## 2014-02-18 VITALS — BP 150/74 | HR 87 | Ht 65.0 in | Wt 264.0 lb

## 2014-02-18 DIAGNOSIS — R748 Abnormal levels of other serum enzymes: Secondary | ICD-10-CM | POA: Diagnosis not present

## 2014-02-18 DIAGNOSIS — I10 Essential (primary) hypertension: Secondary | ICD-10-CM

## 2014-02-18 DIAGNOSIS — R053 Chronic cough: Secondary | ICD-10-CM

## 2014-02-18 DIAGNOSIS — R05 Cough: Secondary | ICD-10-CM | POA: Diagnosis not present

## 2014-02-18 MED ORDER — AZILSARTAN-CHLORTHALIDONE 40-25 MG PO TABS: ORAL_TABLET | ORAL | Status: DC

## 2014-02-18 MED ORDER — OMEPRAZOLE 40 MG PO CPDR: 40.0000 mg | DELAYED_RELEASE_CAPSULE | Freq: Every day | ORAL | Status: DC

## 2014-02-18 NOTE — Patient Instructions (Addendum)
Beta glucan supplement.  Continue on singulair.  mucinex twice daily.   Diet eliminated.

## 2014-02-19 NOTE — Progress Notes (Signed)
   Subjective:    Patient ID: Lisa Stanton, female    DOB: [birth year], 48 y.o.   MRN: [id number]  HPI Pt comes in for spirometry. She continues to have sputum production in the morning. Seems to get better throughout the day. Currently on Singulair which seems to help.  HTN recheck. On edarby 40/12.5. Doing better. No side effects.    Review of Systems  All other systems reviewed and are negative.      Objective:   Physical Exam  Constitutional: She is oriented to person, place, and time. She appears well-developed and well-nourished.  HENT:  Head: Normocephalic and atraumatic.  Cardiovascular: Normal rate, regular rhythm and normal heart sounds.   Pulmonary/Chest: Effort normal and breath sounds normal. She has no wheezes.  Neurological: She is alert and oriented to person, place, and time.  Skin: Skin is warm and dry.  Psychiatric: She has a normal mood and affect. Her behavior is normal.          Assessment & Plan:  Cough/sputum production- allergies vs GERD. spironmetry negative for any COPD or asthmatic changes. FVC was 90 percent. FEV1 was 86. Ratio 78%. 1% improvement in FEV1 and -4 percent improvement with FEF 25-75 percent. Given omeprazole to try for GERD. Given 2-3 weeks. No improvement start mucinex twice daily. Consider beta glucan to help with allergies.   Elevated liver enzymes- given lab slip to have drawn.   HTN-better but not quite to goal.  increased edarbiclor to 40/25. Follow up in 4 weeks BP recheck.

## 2014-03-16 ENCOUNTER — Encounter: Payer: Self-pay | Admitting: Physician Assistant

## 2014-05-22 ENCOUNTER — Ambulatory Visit: Payer: BC Managed Care – PPO | Admitting: Physician Assistant

## 2014-06-23 ENCOUNTER — Encounter: Payer: Self-pay | Admitting: Physician Assistant

## 2014-06-23 ENCOUNTER — Ambulatory Visit (INDEPENDENT_AMBULATORY_CARE_PROVIDER_SITE_OTHER): Payer: Managed Care, Other (non HMO) | Admitting: Physician Assistant

## 2014-06-23 VITALS — BP 137/69 | HR 88 | Ht 65.0 in | Wt 257.0 lb

## 2014-06-23 DIAGNOSIS — R059 Cough, unspecified: Secondary | ICD-10-CM

## 2014-06-23 DIAGNOSIS — I10 Essential (primary) hypertension: Secondary | ICD-10-CM

## 2014-06-23 DIAGNOSIS — R05 Cough: Secondary | ICD-10-CM

## 2014-06-23 MED ORDER — AZILSARTAN-CHLORTHALIDONE 40-25 MG PO TABS: ORAL_TABLET | ORAL | Status: DC

## 2014-06-24 NOTE — Progress Notes (Signed)
   Subjective:    Patient ID: Jake Church, female    DOB: 05-03-1966, 49 y.o.   MRN: [id number]  HPI Patient is a 49 year old female who presents to the clinic to follow-up on hypertension. She started Edarbiclor at last visit for blood pressure. She denies any side effects. She denies any chest pains, palpitations, headaches or vision changes.  Patient still has persistent cough. She is on Singulair, Zyrtec daily. She has tried an acid reducer for the last 3 months with no benefit. There is improvement with Mucinex. She does not use this every day. She had a normal spirometry. She denies any wheezing or shortness of breath.   Review of Systems  All other systems reviewed and are negative.      Objective:   Physical Exam  Constitutional: She is oriented to person, place, and time. She appears well-developed and well-nourished.  HENT:  Head: Normocephalic and atraumatic.  Cardiovascular: Normal rate, regular rhythm and normal heart sounds.   Pulmonary/Chest: Effort normal and breath sounds normal. She has no wheezes.  Neurological: She is alert and oriented to person, place, and time.  Skin: Skin is dry.  Psychiatric: She has a normal mood and affect. Her behavior is normal.          Assessment & Plan:  Hypertension-blood pressure much better at today's visit. Given coupon card for [employer] and refilled for 6 months. cmp ordered.   Cough- spirometry is normal. We have tried an acid reducer. She is on antihistamine and Singulair. There's been no improvement. Continue Mucinex. I offered pulmonology referral for cough. Patient declines at this time.

## 2014-06-26 LAB — BASIC METABOLIC PANEL
Creatinine: 0.7 mg/dL (ref 0.5–1.1)
Glucose: 97 mg/dL
Potassium: 4.2 mmol/L (ref 3.4–5.3)
Sodium: 141 mmol/L (ref 137–147)

## 2014-06-26 LAB — HEPATIC FUNCTION PANEL
ALT: 26 U/L (ref 7–35)
AST: 22 U/L (ref 13–35)
Alkaline Phosphatase: 90 U/L (ref 25–125)

## 2014-06-26 LAB — CMP14+LP+1AC+CBC/D/PLT+TSH
CALCIUM: 9.1 mg/dL
CARBON MONOXIDE, BLOOD: 28
Chloride: 97 mmol/L

## 2014-06-30 ENCOUNTER — Other Ambulatory Visit: Payer: Self-pay | Admitting: *Deleted

## 2014-07-28 ENCOUNTER — Encounter: Payer: Self-pay | Admitting: Physician Assistant

## 2014-08-11 ENCOUNTER — Encounter: Payer: Self-pay | Admitting: Physician Assistant

## 2014-11-11 ENCOUNTER — Other Ambulatory Visit: Payer: Self-pay | Admitting: Physician Assistant

## 2014-12-21 ENCOUNTER — Ambulatory Visit (INDEPENDENT_AMBULATORY_CARE_PROVIDER_SITE_OTHER): Payer: Managed Care, Other (non HMO)

## 2014-12-21 ENCOUNTER — Ambulatory Visit (INDEPENDENT_AMBULATORY_CARE_PROVIDER_SITE_OTHER): Payer: Managed Care, Other (non HMO) | Admitting: Podiatry

## 2014-12-21 ENCOUNTER — Encounter: Payer: Self-pay | Admitting: Podiatry

## 2014-12-21 VITALS — BP 127/70 | HR 79 | Resp 15 | Ht 64.5 in | Wt 250.0 lb

## 2014-12-21 DIAGNOSIS — M79671 Pain in right foot: Secondary | ICD-10-CM

## 2014-12-21 DIAGNOSIS — M79672 Pain in left foot: Secondary | ICD-10-CM

## 2014-12-21 DIAGNOSIS — M722 Plantar fascial fibromatosis: Secondary | ICD-10-CM

## 2014-12-21 MED ORDER — TRIAMCINOLONE ACETONIDE 10 MG/ML IJ SUSP
10.0000 mg | Freq: Once | INTRAMUSCULAR | Status: AC
  Administered 2014-12-21: 10 mg

## 2014-12-21 MED ORDER — DICLOFENAC SODIUM 75 MG PO TBEC: 75.0000 mg | DELAYED_RELEASE_TABLET | Freq: Two times a day (BID) | ORAL | Status: DC

## 2014-12-21 NOTE — Progress Notes (Signed)
   Subjective:    Patient ID: Lisa Stanton, female    DOB: 10-07-1965, 49 y.o.   MRN: [id number]  HPI Patient presents with bilateral foot pain, heel of foot. Pt stated, "Right foot hurts more". Pt also stated, "hurts more in the morning and throbs at night" Pt was treated for heel spurs 12 years ago where they got an injection. Pt stated, "injection did help with pain". This was done in [place], [place], but pt could not remember the practice name.  Pt has tried rubbing foot with a tennis ball with no relief. Ibuprofen does not help patient.   Review of Systems  HENT: Positive for sinus pressure.   Respiratory: Positive for cough.   Musculoskeletal: Positive for joint swelling.  All other systems reviewed and are negative.      Objective:   Physical Exam        Assessment & Plan:

## 2014-12-21 NOTE — Patient Instructions (Signed)

## 2014-12-22 NOTE — Progress Notes (Signed)
Subjective:     Patient ID: Lisa Stanton, female   DOB: July 09, 1965, 49 y.o.   MRN: [id number]  HPI patient presents stating I'm having a lot of heel pain right over left and states that it's been an ongoing issue. States that she has tried different treatments without relief including shoe gear modifications ice therapy and is had a history of this condition and number of years ago   Review of Systems  All other systems reviewed and are negative.      Objective:   Physical Exam  Constitutional: She is oriented to person, place, and time.  Cardiovascular: Intact distal pulses.   Musculoskeletal: Normal range of motion.  Neurological: She is oriented to person, place, and time.  Skin: Skin is warm.  Nursing note and vitals reviewed.  neurovascular status intact muscle strength adequate range of motion within normal limits with patient having discomfort in the plantar aspect heel right over left at the insertion tendon calcaneus. Patient's noted to have high arch foot type and is noted to have quite a bit of tension on the forefoot and rear foot. Patient does have mild equinus is well oriented 3 with good digital perfusion     Assessment:     Plantar fasciitis acute nature right over left with cavus foot structure    Plan:     H&P and x-rays reviewed with patient. Today I went ahead and I injected the plantar fascia bilateral 3 mg Kenalog 5 mg Xylocaine and for the right I applied fascial brace to lift the arch. I placed on diclofenac 75 mg twice a day gave instructions on physical therapy and reappoint to recheck

## 2014-12-28 ENCOUNTER — Ambulatory Visit (INDEPENDENT_AMBULATORY_CARE_PROVIDER_SITE_OTHER): Payer: Managed Care, Other (non HMO) | Admitting: Podiatry

## 2014-12-28 ENCOUNTER — Encounter: Payer: Self-pay | Admitting: Podiatry

## 2014-12-28 VITALS — BP 136/53 | HR 80 | Resp 16

## 2014-12-28 DIAGNOSIS — M722 Plantar fascial fibromatosis: Secondary | ICD-10-CM

## 2014-12-28 MED ORDER — TRIAMCINOLONE ACETONIDE 10 MG/ML IJ SUSP
10.0000 mg | Freq: Once | INTRAMUSCULAR | Status: AC
  Administered 2014-12-28: 10 mg

## 2014-12-28 NOTE — Progress Notes (Signed)
Subjective:     Patient ID: Lisa Stanton, female   DOB: 11-Oct-1965, 49 y.o.   MRN: [id number]  HPI patient states that I'm quite a bit improved but still having some discomfort in the plantar right heel with the left heel being almost completely resolved   Review of Systems     Objective:   Physical Exam Neurovascular status intact muscle strength adequate with continued discomfort right plantar heel at the insertion of the tendon into the calcaneus with minimal discomfort noted in the left plantar fashion    Assessment:     Doing better from plantar fascial symptomatology with pain still present right and swelling    Plan:     Reviewed condition and reinjected the plantar fascia right 3 mg Kenalog 5 mg Xylocaine and advised on ice therapy supportive shoe and reappoint in 4 weeks.

## 2015-01-22 ENCOUNTER — Encounter: Payer: Self-pay | Admitting: Physician Assistant

## 2015-01-22 ENCOUNTER — Ambulatory Visit (INDEPENDENT_AMBULATORY_CARE_PROVIDER_SITE_OTHER): Payer: Managed Care, Other (non HMO) | Admitting: Physician Assistant

## 2015-01-22 VITALS — BP 139/90 | HR 105 | Ht 64.5 in | Wt 273.0 lb

## 2015-01-22 DIAGNOSIS — E785 Hyperlipidemia, unspecified: Secondary | ICD-10-CM | POA: Diagnosis not present

## 2015-01-22 DIAGNOSIS — Z131 Encounter for screening for diabetes mellitus: Secondary | ICD-10-CM

## 2015-01-22 DIAGNOSIS — Z23 Encounter for immunization: Secondary | ICD-10-CM

## 2015-01-22 DIAGNOSIS — Z0000 Encounter for general adult medical examination without abnormal findings: Secondary | ICD-10-CM | POA: Diagnosis not present

## 2015-01-22 DIAGNOSIS — R5383 Other fatigue: Secondary | ICD-10-CM | POA: Diagnosis not present

## 2015-01-22 DIAGNOSIS — I10 Essential (primary) hypertension: Secondary | ICD-10-CM

## 2015-01-22 MED ORDER — OMEPRAZOLE 40 MG PO CPDR: 40.0000 mg | DELAYED_RELEASE_CAPSULE | Freq: Every day | ORAL | Status: DC

## 2015-01-22 MED ORDER — MEDROXYPROGESTERONE ACETATE 10 MG PO TABS: 10.0000 mg | ORAL_TABLET | Freq: Every day | ORAL | Status: DC

## 2015-01-22 MED ORDER — AZILSARTAN-CHLORTHALIDONE 40-25 MG PO TABS: ORAL_TABLET | ORAL | Status: DC

## 2015-01-22 MED ORDER — PHENTERMINE HCL 37.5 MG PO TABS: 37.5000 mg | ORAL_TABLET | Freq: Every day | ORAL | Status: DC

## 2015-01-22 MED ORDER — TOPIRAMATE 50 MG PO TABS
50.0000 mg | ORAL_TABLET | Freq: Every day | ORAL | Status: DC
Start: 2015-01-22 — End: 2015-02-23

## 2015-01-22 NOTE — Progress Notes (Signed)
   Subjective:    Patient ID: Lisa Stanton, female    DOB: 1966-03-13, 49 y.o.   MRN: [id number]  HPI   Review of Systems     Objective:   Physical Exam        Assessment & Plan:   Subjective:     Lisa Stanton is a 49 y.o. female and is here for a comprehensive physical exam. The patient reports problems - she is ready to lose weight. she has gained another 10lbs. she feels tired a ll the time and worn out. not currently exercising. .  Social History   Social History  . Marital Status: Married    Spouse Name: Doren Custard  . Number of Children: 0  . 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 Smoking status: Never Smoker   . Smokeless tobacco: Not on file  . Alcohol Use: Yes     Comment: 6 times/year  . Drug Use: No  . Sexual Activity:    Partners: Male   Other Topics Concern  . Not on file   Social History Narrative   2 caffeine per day. Regular exercise, walking.     Health Maintenance  Topic Date Due  . HIV Screening  12/10/1981  . MAMMOGRAM  02/14/2015  . INFLUENZA VACCINE  12/14/2015  . TETANUS/TDAP  12/10/2019    The following portions of the patient's history were reviewed and updated as appropriate: allergies, current medications, past family history, past medical history, past social history, past surgical history and problem list.  Review of Systems A comprehensive review of systems was negative.   Objective:    BP 139/90 mmHg  Pulse 105  Ht 5' 4.5" (1.638 m)  Wt 273 lb (123.832 kg)  BMI 46.15 kg/m2  LMP 03/15/2009 General appearance: alert, cooperative and morbidly obese Head: Normocephalic, without obvious abnormality, atraumatic Eyes: conjunctivae/corneas clear. PERRL, EOM's intact. Fundi benign. Ears: normal TM's and external ear canals both ears Nose: Nares normal. Septum midline. Mucosa normal. No drainage or sinus tenderness. Throat: lips, mucosa, and  tongue normal; teeth and gums normal Neck: no adenopathy, no carotid bruit, no JVD, supple, symmetrical, trachea midline and thyroid not enlarged, symmetric, no tenderness/mass/nodules Back: symmetric, no curvature. ROM normal. No CVA tenderness. Lungs: clear to auscultation bilaterally Heart: regular rate and rhythm, S1, S2 normal, no murmur, click, rub or gallop Abdomen: soft, non-tender; bowel sounds normal; no masses,  no organomegaly Pelvic: cervix normal in appearance, external genitalia normal, no adnexal masses or tenderness, no cervical motion tenderness, uterus surgically absent and vagina normal without discharge Extremities: extremities normal, atraumatic, no cyanosis or edema Pulses: 2+ and symmetric Skin: Skin color, texture, turgor normal. No rashes or lesions Lymph nodes: Cervical, supraclavicular, and axillary nodes normal. Neurologic: Grossly normal    Assessment:    Healthy female exam.      Plan:    CPE- flu shot given. No need for pap due to hysterectomy. Declined bimanuel today.mammogram ordered.  Fasting labs given. Discussed calcium 1500mg  or 4 servings of dairy and vitamin D 800 units.   Morbid obesity- discussed need for weight loss. Started phentermine and topamax. Discussed side effects. Diet and exercise discussed. Follow up in 4 weeks.   HtN- initially elevated. Rechecked and better. Refilled medication.    See After Visit Summary for Counseling Recommendations

## 2015-01-22 NOTE — Patient Instructions (Signed)

## 2015-01-24 DIAGNOSIS — E6609 Other obesity due to excess calories: Secondary | ICD-10-CM | POA: Insufficient documentation

## 2015-01-24 DIAGNOSIS — Z6837 Body mass index (BMI) 37.0-37.9, adult: Secondary | ICD-10-CM

## 2015-01-26 LAB — LIPID PANEL
Cholesterol: 179 mg/dL (ref 0–200)
HDL: 39 mg/dL (ref 35–70)
LDL Cholesterol: 108 mg/dL
LDL/HDL RATIO: 4.6
TRIGLYCERIDES: 158 mg/dL (ref 40–160)

## 2015-01-26 LAB — BASIC METABOLIC PANEL
BUN: 14 mg/dL (ref 4–21)
CREATININE: 0.8 mg/dL (ref 0.5–1.1)
Glucose: 114 mg/dL
POTASSIUM: 4.5 mmol/L (ref 3.4–5.3)
SODIUM: 146 mmol/L (ref 137–147)

## 2015-01-26 LAB — VITAMIN D 25 HYDROXY (VIT D DEFICIENCY, FRACTURES): VIT D 25 HYDROXY: 36.5

## 2015-01-26 LAB — HEPATIC FUNCTION PANEL
ALT: 36 U/L — AB (ref 7–35)
AST: 27 U/L (ref 13–35)
Alkaline Phosphatase: 90 U/L (ref 25–125)
BILIRUBIN, TOTAL: 0.4 mg/dL

## 2015-01-26 LAB — COMPREHENSIVE METABOLIC PANEL: CALCIUM: 10.4

## 2015-01-26 LAB — VITAMIN B12: VITAMIN B12: >2000

## 2015-01-26 LAB — TSH: TSH: 2.16 u[IU]/mL (ref 0.41–5.90)

## 2015-01-27 ENCOUNTER — Telehealth: Payer: Self-pay | Admitting: Physician Assistant

## 2015-01-27 NOTE — Telephone Encounter (Signed)
Call pt: LDL 108 better almost to goal. Weight loss should help.  TG slightly elevated.  HDL needs to be increased. Exercise will help.  Vitamin D good.  Thyroid good.  Glucose elevated- need to check a1c. Can we order this or come into office for POC.  Calcium and sodium are a little high. Would also like to add a PTH.  ALT a little elevated not concerned.

## 2015-02-04 NOTE — Telephone Encounter (Signed)
Pt notified of all results & recommendations.  She will get the a1c checked at her next nurse visit for a bp check.

## 2015-02-08 ENCOUNTER — Encounter: Payer: Self-pay | Admitting: Physician Assistant

## 2015-02-08 ENCOUNTER — Ambulatory Visit: Payer: Managed Care, Other (non HMO) | Admitting: Podiatry

## 2015-02-13 ENCOUNTER — Other Ambulatory Visit: Payer: Self-pay | Admitting: Family Medicine

## 2015-02-23 ENCOUNTER — Ambulatory Visit (INDEPENDENT_AMBULATORY_CARE_PROVIDER_SITE_OTHER): Payer: Managed Care, Other (non HMO) | Admitting: Family Medicine

## 2015-02-23 VITALS — BP 122/77 | HR 85 | Wt 253.0 lb

## 2015-02-23 DIAGNOSIS — R739 Hyperglycemia, unspecified: Secondary | ICD-10-CM

## 2015-02-23 DIAGNOSIS — R7309 Other abnormal glucose: Secondary | ICD-10-CM

## 2015-02-23 DIAGNOSIS — R635 Abnormal weight gain: Secondary | ICD-10-CM | POA: Diagnosis not present

## 2015-02-23 DIAGNOSIS — Z6841 Body Mass Index (BMI) 40.0 and over, adult: Secondary | ICD-10-CM

## 2015-02-23 LAB — POCT GLYCOSYLATED HEMOGLOBIN (HGB A1C): HEMOGLOBIN A1C: 6.4

## 2015-02-23 MED ORDER — PHENTERMINE HCL 37.5 MG PO TABS: 37.5000 mg | ORAL_TABLET | Freq: Every day | ORAL | Status: DC

## 2015-02-23 NOTE — Progress Notes (Signed)
   Subjective:    Patient ID: Lisa Stanton, female    DOB: 14-Sep-1965, 49 y.o.   MRN: [id number]  HPI    Review of Systems     Objective:   Physical Exam        Assessment & Plan:  Agree with below.   Beatrice Lecher, MD

## 2015-02-23 NOTE — Progress Notes (Signed)
   Subjective:    Patient ID: Lisa Stanton, female    DOB: 09-26-1965, 49 y.o.   MRN: [id number]  HPI  Patient is here for blood pressure and weight check. Denies any trouble sleeping, palpitations, or any other medication problems. Pt does report she "drinks no soda and stays away from sweets" but she injured her foot so has not been exercising as much as she would like. We are also to recheck her a1c today, Pt does not have a diagnosis of Diabetes but has been in the "pre-diabetes" range with a strong family history.   Review of Systems     Objective:   Physical Exam        Assessment & Plan:  Patient has lost weight. A refill for Phentermine will be sent to patient preferred pharmacy. POCT A1C was preformed in office today, her value was 6.4. Patient advised to schedule a four week nurse visit and keep her upcoming appointment with her PCP. Verbalized understanding, no further questions.

## 2015-03-23 ENCOUNTER — Ambulatory Visit: Payer: Managed Care, Other (non HMO)

## 2015-05-13 ENCOUNTER — Other Ambulatory Visit: Payer: Self-pay | Admitting: Physician Assistant

## 2015-05-25 ENCOUNTER — Other Ambulatory Visit: Payer: Self-pay | Admitting: Physician Assistant

## 2015-06-24 ENCOUNTER — Other Ambulatory Visit: Payer: Self-pay | Admitting: Physician Assistant

## 2015-08-12 ENCOUNTER — Other Ambulatory Visit: Payer: Self-pay | Admitting: Physician Assistant

## 2015-09-22 ENCOUNTER — Other Ambulatory Visit: Payer: Self-pay | Admitting: *Deleted

## 2015-09-22 MED ORDER — VALACYCLOVIR HCL 1 G PO TABS: ORAL_TABLET | ORAL | Status: DC

## 2015-10-20 ENCOUNTER — Other Ambulatory Visit: Payer: Self-pay | Admitting: Physician Assistant

## 2015-11-10 ENCOUNTER — Other Ambulatory Visit: Payer: Self-pay | Admitting: Physician Assistant

## 2015-12-27 ENCOUNTER — Other Ambulatory Visit: Payer: Self-pay | Admitting: Physician Assistant

## 2016-01-25 ENCOUNTER — Ambulatory Visit (INDEPENDENT_AMBULATORY_CARE_PROVIDER_SITE_OTHER): Payer: Managed Care, Other (non HMO) | Admitting: Physician Assistant

## 2016-01-25 ENCOUNTER — Encounter: Payer: Self-pay | Admitting: Physician Assistant

## 2016-01-25 ENCOUNTER — Other Ambulatory Visit: Payer: Self-pay | Admitting: Physician Assistant

## 2016-01-25 VITALS — BP 139/59 | HR 79 | Ht 64.5 in | Wt 251.0 lb

## 2016-01-25 DIAGNOSIS — R490 Dysphonia: Secondary | ICD-10-CM

## 2016-01-25 DIAGNOSIS — Z13220 Encounter for screening for lipoid disorders: Secondary | ICD-10-CM

## 2016-01-25 DIAGNOSIS — R05 Cough: Secondary | ICD-10-CM

## 2016-01-25 DIAGNOSIS — Z800 Family history of malignant neoplasm of digestive organs: Secondary | ICD-10-CM

## 2016-01-25 DIAGNOSIS — Z0000 Encounter for general adult medical examination without abnormal findings: Secondary | ICD-10-CM | POA: Diagnosis not present

## 2016-01-25 DIAGNOSIS — Z23 Encounter for immunization: Secondary | ICD-10-CM | POA: Diagnosis not present

## 2016-01-25 DIAGNOSIS — K5900 Constipation, unspecified: Secondary | ICD-10-CM | POA: Insufficient documentation

## 2016-01-25 DIAGNOSIS — Z1231 Encounter for screening mammogram for malignant neoplasm of breast: Secondary | ICD-10-CM

## 2016-01-25 DIAGNOSIS — Z808 Family history of malignant neoplasm of other organs or systems: Secondary | ICD-10-CM

## 2016-01-25 DIAGNOSIS — R059 Cough, unspecified: Secondary | ICD-10-CM

## 2016-01-25 LAB — CBC WITH DIFFERENTIAL/PLATELET
BASOS PCT: 1 %
Basophils Absolute: 87 cells/uL (ref 0–200)
EOS ABS: 0 {cells}/uL — AB (ref 15–500)
Eosinophils Relative: 0 %
HEMATOCRIT: 40.4 % (ref 35.0–45.0)
HEMOGLOBIN: 13.5 g/dL (ref 11.7–15.5)
LYMPHS ABS: 3045 {cells}/uL (ref 850–3900)
Lymphocytes Relative: 35 %
MCH: 29.8 pg (ref 27.0–33.0)
MCHC: 33.4 g/dL (ref 32.0–36.0)
MCV: 89.2 fL (ref 80.0–100.0)
MONO ABS: 522 {cells}/uL (ref 200–950)
MPV: 9.5 fL (ref 7.5–12.5)
Monocytes Relative: 6 %
NEUTROS ABS: 5046 {cells}/uL (ref 1500–7800)
Neutrophils Relative %: 58 %
Platelets: 355 10*3/uL (ref 140–400)
RBC: 4.53 MIL/uL (ref 3.80–5.10)
RDW: 14.8 % (ref 11.0–15.0)
WBC: 8.7 10*3/uL (ref 3.8–10.8)

## 2016-01-25 LAB — COMPLETE METABOLIC PANEL WITH GFR
ALBUMIN: 3.9 g/dL (ref 3.6–5.1)
ALK PHOS: 67 U/L (ref 33–115)
ALT: 26 U/L (ref 6–29)
AST: 19 U/L (ref 10–35)
BILIRUBIN TOTAL: 0.5 mg/dL (ref 0.2–1.2)
BUN: 11 mg/dL (ref 7–25)
CO2: 28 mmol/L (ref 20–31)
CREATININE: 0.64 mg/dL (ref 0.50–1.10)
Calcium: 9.5 mg/dL (ref 8.6–10.2)
Chloride: 101 mmol/L (ref 98–110)
GFR, Est African American: >89 mL/min (ref >=60)
GLUCOSE: 106 mg/dL — AB (ref 65–99)
Potassium: 4 mmol/L (ref 3.5–5.3)
SODIUM: 141 mmol/L (ref 135–146)
TOTAL PROTEIN: 7.2 g/dL (ref 6.1–8.1)

## 2016-01-25 LAB — LIPID PANEL
Cholesterol: 148 mg/dL (ref 125–200)
HDL: 38 mg/dL — AB (ref >=46)
LDL Cholesterol: 82 mg/dL (ref <130)
Total CHOL/HDL Ratio: 3.9 Ratio (ref <=5.0)
Triglycerides: 139 mg/dL (ref <150)
VLDL: 28 mg/dL (ref <30)

## 2016-01-25 MED ORDER — MEDROXYPROGESTERONE ACETATE 10 MG PO TABS: 10.0000 mg | ORAL_TABLET | Freq: Every day | ORAL | 3 refills | Status: DC

## 2016-01-25 MED ORDER — VALACYCLOVIR HCL 1 G PO TABS: ORAL_TABLET | ORAL | 1 refills | Status: DC

## 2016-01-25 MED ORDER — PRAVASTATIN SODIUM 80 MG PO TABS: 80.0000 mg | ORAL_TABLET | Freq: Every day | ORAL | 5 refills | Status: DC

## 2016-01-25 MED ORDER — AZILSARTAN-CHLORTHALIDONE 40-25 MG PO TABS: 1.0000 | ORAL_TABLET | Freq: Every day | ORAL | 5 refills | Status: DC

## 2016-01-25 NOTE — Progress Notes (Addendum)
Subjective:     Patient ID: Lisa Stanton, female   DOB: [birth year], 50 y.o.   MRN: [id number]  HPI Patient is a 50 y.o. Caucasian female presenting today for her annual physical exam. The patient reports that she feel as if she is well managed on her current medication regimen. The patient states that she does need medication refills on all of her medications. The patient notes that her brother and sister have thyroid cancer and that her father was recently diagnosed with pancreatic cancer. The patient states that she tries to eat healthy and exercise but with everything going on in her personal life she is struggling to do so. The patient notes that her physical exercise at this time includes walking the dogs and that she does try to eat salad occasionally. Additionally,the patient states that she is still experiencing a constant cough which does produce phlegm. She notes that she has had this cough for the past 4 years and that it started when she was taking lisinopril. The patient states that at that time she was also very sick. The patient notes that her cough happens more at home and she feels like there is a lot of mucus. She recently discontinued her allergy medication and feels like her symptoms improved. She notes that she also feels her cough did not improve when she was taking acid reflux medication and is no longer taking this medication. The patient states that she also has complaints of constipation and that it is difficult for her to have bowel movements. She states that her stool is harder and she has a bowel movement every 3-4 days. The patient denies blood or abdominal pain. The patient currently is taking an over-the-counter stool softener without relief. The patient states that she does not use sunscreen but is currently taking her vitamin D and multivitamin.  Review of Systems  Constitutional: Negative for activity change, appetite change, chills, diaphoresis, fatigue, fever and  unexpected weight change.  HENT: Negative for congestion, dental problem, drooling, ear discharge, ear pain, facial swelling, hearing loss, mouth sores, nosebleeds, postnasal drip, rhinorrhea, sinus pressure, sneezing, sore throat, tinnitus, trouble swallowing and voice change.   Eyes: Negative for photophobia, pain, discharge, redness, itching and visual disturbance.  Respiratory: Positive for cough. Negative for apnea, choking, chest tightness, shortness of breath, wheezing and stridor.   Cardiovascular: Negative for chest pain and palpitations.  Gastrointestinal: Positive for constipation. Negative for abdominal distention, abdominal pain, anal bleeding, blood in stool, diarrhea, nausea, rectal pain and vomiting.  Endocrine: Negative for heat intolerance, polydipsia and polyuria.  Genitourinary: Negative for decreased urine volume, difficulty urinating, dyspareunia, dysuria, enuresis, flank pain, frequency, genital sores, hematuria, menstrual problem, pelvic pain, urgency, vaginal bleeding and vaginal discharge.  Musculoskeletal: Negative for arthralgias, back pain, gait problem, joint swelling, myalgias, neck pain and neck stiffness.  Skin: Negative for color change, pallor, rash and wound.  Neurological: Negative for dizziness, tremors, seizures, syncope, facial asymmetry, speech difficulty, weakness, light-headedness, numbness and headaches.  Psychiatric/Behavioral: Negative for behavioral problems and confusion.      Objective:   Physical Exam  Constitutional: She is oriented to person, place, and time. She appears well-developed and well-nourished. No distress.  HENT:  Head: Normocephalic and atraumatic.  Right Ear: External ear normal.  Left Ear: External ear normal.  Nose: Nose normal.  Mouth/Throat: Oropharynx is clear and moist. No oropharyngeal exudate.  Thyroid was without nodules, tenderness, or enlargment.  Eyes: Conjunctivae and EOM are normal. Pupils are equal, round, and  reactive to light. Right eye exhibits no discharge. Left eye exhibits no discharge. No scleral icterus.  Neck: Normal range of motion. Neck supple. No JVD present. No tracheal deviation present. No thyromegaly present.  Cardiovascular: Normal rate, regular rhythm, normal heart sounds and intact distal pulses.  Exam reveals no gallop and no friction rub.   No murmur heard. Pulmonary/Chest: Effort normal and breath sounds normal. No stridor. No respiratory distress. She has no wheezes. She has no rales. She exhibits no tenderness.  Abdominal: Soft. Bowel sounds are normal. She exhibits no distension and no mass. There is no tenderness. There is no rebound and no guarding.  Musculoskeletal: Normal range of motion. She exhibits no edema, tenderness or deformity.  Lymphadenopathy:    She has no cervical adenopathy.  Neurological: She is alert and oriented to person, place, and time. She has normal reflexes. She displays normal reflexes. No cranial nerve deficit. She exhibits normal muscle tone. Coordination normal.  Skin: Skin is warm and dry. No rash noted. She is not diaphoretic. No erythema. No pallor.  Psychiatric: She has a normal mood and affect. Her behavior is normal. Judgment and thought content normal.  Nursing note and vitals reviewed.     Assessment:     Lisa Stanton was seen today for annual exam.  Diagnoses and all orders for this visit:  Routine physical examination -     Lipid panel -     COMPLETE METABOLIC PANEL WITH GFR -     TSH -     T4, free -     CBC with Differential/Platelet  Influenza vaccine needed -     Flu Vaccine QUAD 36+ mos PF IM (Fluarix & Fluzone Quad PF)  Family history of thyroid cancer -     TSH -     T4, free -     US Soft Tissue Head/Neck; Future  Morbid obesity, unspecified obesity type ([place]) -     TSH -     T4, free  Cough -     TSH -     T4, free -     CBC with Differential/Platelet -     US Soft Tissue Head/Neck; Future  Hoarse voice  quality -     TSH -     T4, free -     CBC with Differential/Platelet -     US Soft Tissue Head/Neck; Future  Family history of pancreatic cancer  Screening for lipid disorders -     Lipid panel  Visit for screening mammogram -     MM DIGITAL SCREENING BILATERAL; Future  Constipation, unspecified constipation type  Other orders -     Azilsartan-Chlorthalidone (EDARBYCLOR) 40-25 MG TABS; Take 1 tablet by mouth daily. -     medroxyPROGESTERone (PROVERA) 10 MG tablet; Take 1 tablet (10 mg total) by mouth daily. -     pravastatin (PRAVACHOL) 80 MG tablet; Take 1 tablet (80 mg total) by mouth daily. -     valACYclovir (VALTREX) 1000 MG tablet; Take 2 tablets every 12 hours for one day every exacerbation      Plan:     1. Physical: Patient comes in today for annual physical examination. I performed thorough physical exam including requisition for routine labwork (CBC, CMP, Lipid panel, TSH). No need for pap. Too early for colonoscopy. Patient encouraged to engage in 150 minutes of exercise a week and take oral over-the-counter Vitamin D 800 units and Calcium 1500mg  daily and/or 4 servings of dairy a day.  Patient given influenza vaccination. Patient was updated on preventative health including scheduling her screening mammogram. Patients medications were refilled including azilsartan- chlorthalidone, medroxyprogesterone, pravastatin, and valacyclovir. Patient to will be contacted via phone for lab review.   2. Cough/hoarse voice - Patient stated that she was concerned about possible thyroid dysfunction/malignancy secondary to her family history. Electronic lab requisition form was sent for serum TSH, T4, and CBC with diff. Patient was given requisition form for soft tissue of the head/neck ultrasound. Patient previously underwent spirometry in office with normal results.discussed with patient referral to pulmonologist but declined today.    3. Constipation - Discussed with patient that she  should continue to take over-the-counter stool softeners including Miralax. Patient was instructed to increase fluid intake and dietary fiber consumption such as lentils, black or lima beans, peas, and oatmeal. Patient to return-to-clinic if symptoms persist or worsen.  4. Family history of thyroid cancer - Dicussed with patient her extensive family history of thyroid cancer including her sister and brother. The patient is not currently displaying any clinical signs or symptoms for thyroid dysfunction, however, she is requesting a full panel of screening tests. An electronic laboratory requisition form for serum TSH and T4 was sent to the lab. Patient was also given a requisition form for soft tissue head/neck ultrasound. Will contact patient via phone for lab and ultrasound review.  5. Family history of pancreatic cancer - Discussed with patient her fathers recent caner diagnosis. Instructed patient that we will continue to monitor her laboratory values and health risk.  6. Morbid obesity: Extensively (15 minutes) discussed with patient the need for daily dietary implementation with a focus on portion control and daily caloric deficits. Patient advised to track their daily caloric intake. Patient voices understanding of her ideal weight and agrees with plan to obtain this goal by losing 0.5 lbs/week. Electronic lab requisition form for serum TSH and T4 was sent to evaluate thyroid dysfunction as a possible etiology of her obesity.

## 2016-01-25 NOTE — Patient Instructions (Addendum)
Probiotic.   Keeping You Healthy  Get These Tests 1. Blood Pressure- Have your blood pressure checked once a year by your health care provider.  Normal blood pressure is 120/80. 2. Weight- Have your body mass index (BMI) calculated to screen for obesity.  BMI is measure of body fat based on height and weight.  You can also calculate your own BMI at GravelBags.it. 3. Cholesterol- Have your cholesterol checked every 5 years starting at age 49 then yearly starting at age 60. 17. Chlamydia, HIV, and other sexually transmitted diseases- Get screened every year until age 71, then within three months of each new sexual provider. 5. Pap Test - Every 1-5 years; discuss with your health care provider. 6. Mammogram- Every 1-2 years starting at age 61--50  Take these medicines  Calcium with Vitamin D-Your body needs 1200 mg of Calcium each day and [phone] IU of Vitamin D daily.  Your body can only absorb 500 mg of Calcium at a time so Calcium must be taken in 2 or 3 divided doses throughout the day.  Multivitamin with folic acid- Once daily if it is possible for you to become pregnant.  Get these Immunizations  Gardasil-Series of three doses; prevents HPV related illness such as genital warts and cervical cancer.  Menactra-Single dose; prevents meningitis.  Tetanus shot- Every 10 years.  Flu shot-Every year.  Take these steps 1. Do not smoke-Your healthcare provider can help you quit.  For tips on how to quit go to www.smokefree.gov or call 1-800 QUITNOW. 2. Be physically active- Exercise 5 days a week for at least 30 minutes.  If you are not already physically active, start slow and gradually work up to 30 minutes of moderate physical activity.  Examples of moderate activity include walking briskly, dancing, swimming, bicycling, etc. 3. Breast Cancer- A self breast exam every month is important for early detection of breast cancer.  For more information and instruction on self breast  exams, ask your healthcare provider or https://www.patel.info/. 4. Eat a healthy diet- Eat a variety of healthy foods such as fruits, vegetables, whole grains, low fat milk, low fat cheeses, yogurt, lean meats, poultry and fish, beans, nuts, tofu, etc.  For more information go to www. Thenutritionsource.org 5. Drink alcohol in moderation- Limit alcohol intake to one drink or less per day. Never drink and drive. 6. Depression- Your emotional health is as important as your physical health.  If you're feeling down or losing interest in things you normally enjoy please talk to your healthcare provider about being screened for depression. 7. Dental visit- Brush and floss your teeth twice daily; visit your dentist twice a year. 8. Eye doctor- Get an eye exam at least every 2 years. 9. Helmet use- Always wear a helmet when riding a bicycle, motorcycle, rollerblading or skateboarding. 65. Safe sex- If you may be exposed to sexually transmitted infections, use a condom. 11. Seat belts- Seat belts can save your live; always wear one. 12. Smoke/Carbon Monoxide detectors- These detectors need to be installed on the appropriate level of your home. Replace batteries at least once a year. 13. Skin cancer- When out in the sun please cover up and use sunscreen 15 SPF or higher. 14. Violence- If anyone is threatening or hurting you, please tell your healthcare provider.

## 2016-01-26 ENCOUNTER — Ambulatory Visit (INDEPENDENT_AMBULATORY_CARE_PROVIDER_SITE_OTHER): Payer: Managed Care, Other (non HMO)

## 2016-01-26 ENCOUNTER — Encounter: Payer: Self-pay | Admitting: Physician Assistant

## 2016-01-26 DIAGNOSIS — Z808 Family history of malignant neoplasm of other organs or systems: Secondary | ICD-10-CM

## 2016-01-26 DIAGNOSIS — Z1231 Encounter for screening mammogram for malignant neoplasm of breast: Secondary | ICD-10-CM | POA: Diagnosis not present

## 2016-01-26 DIAGNOSIS — E041 Nontoxic single thyroid nodule: Secondary | ICD-10-CM

## 2016-01-26 DIAGNOSIS — R05 Cough: Secondary | ICD-10-CM

## 2016-01-26 DIAGNOSIS — E042 Nontoxic multinodular goiter: Secondary | ICD-10-CM

## 2016-01-26 DIAGNOSIS — R490 Dysphonia: Secondary | ICD-10-CM

## 2016-01-26 DIAGNOSIS — R059 Cough, unspecified: Secondary | ICD-10-CM

## 2016-01-26 LAB — T4, FREE: Free T4: 0.9 ng/dL (ref 0.8–1.8)

## 2016-01-26 LAB — TSH: TSH: 1.54 mIU/L

## 2016-01-27 LAB — HEMOGLOBIN A1C
Hgb A1c MFr Bld: 6.1 % — ABNORMAL HIGH (ref <5.7)
Mean Plasma Glucose: 128 mg/dL

## 2016-02-03 ENCOUNTER — Encounter: Payer: Self-pay | Admitting: Physician Assistant

## 2016-02-07 ENCOUNTER — Other Ambulatory Visit: Payer: Self-pay | Admitting: Physician Assistant

## 2016-02-07 DIAGNOSIS — E042 Nontoxic multinodular goiter: Secondary | ICD-10-CM

## 2016-02-15 ENCOUNTER — Ambulatory Visit
Admission: RE | Admit: 2016-02-15 | Discharge: 2016-02-15 | Disposition: A | Discharge status: Home / Self Care | Payer: Managed Care, Other (non HMO) | Source: Ambulatory Visit | Attending: Physician Assistant | Admitting: Physician Assistant

## 2016-02-15 ENCOUNTER — Other Ambulatory Visit (HOSPITAL_COMMUNITY)
Admission: RE | Admit: 2016-02-15 | Discharge: 2016-02-15 | Disposition: A | Discharge status: Home / Self Care | Payer: Managed Care, Other (non HMO) | Source: Ambulatory Visit | Attending: Radiology | Admitting: Radiology

## 2016-02-15 DIAGNOSIS — D34 Benign neoplasm of thyroid gland: Secondary | ICD-10-CM | POA: Diagnosis not present

## 2016-02-15 DIAGNOSIS — E042 Nontoxic multinodular goiter: Secondary | ICD-10-CM

## 2016-02-15 DIAGNOSIS — E041 Nontoxic single thyroid nodule: Secondary | ICD-10-CM | POA: Diagnosis present

## 2016-02-24 ENCOUNTER — Ambulatory Visit (INDEPENDENT_AMBULATORY_CARE_PROVIDER_SITE_OTHER): Payer: Managed Care, Other (non HMO)

## 2016-02-24 ENCOUNTER — Ambulatory Visit (INDEPENDENT_AMBULATORY_CARE_PROVIDER_SITE_OTHER): Payer: Managed Care, Other (non HMO) | Admitting: Podiatry

## 2016-02-24 ENCOUNTER — Encounter: Payer: Self-pay | Admitting: Podiatry

## 2016-02-24 VITALS — BP 106/80 | HR 86 | Resp 16

## 2016-02-24 DIAGNOSIS — M79672 Pain in left foot: Secondary | ICD-10-CM

## 2016-02-24 DIAGNOSIS — M722 Plantar fascial fibromatosis: Secondary | ICD-10-CM

## 2016-02-24 DIAGNOSIS — M79671 Pain in right foot: Secondary | ICD-10-CM

## 2016-02-24 MED ORDER — TRIAMCINOLONE ACETONIDE 10 MG/ML IJ SUSP
10.0000 mg | Freq: Once | INTRAMUSCULAR | Status: AC
  Administered 2016-02-24: 10 mg

## 2016-02-24 NOTE — Patient Instructions (Signed)

## 2016-02-24 NOTE — Progress Notes (Signed)
Subjective:     Patient ID: Lisa Stanton, female   DOB: 09-05-1965, 50 y.o.   MRN: [id number]  HPI patient has started to develop pain in the heel region bilateral and did have a period were was reasonably good but continues to have discomfort   Review of Systems     Objective:   Physical Exam Neurovascular status intact with exquisite discomfort plantar fascial bilateral that's worse when getting up in the morning and is affecting patient's ability to be active    Assessment:     Plantar fasciitis bilateral with chronic nature to condition    Plan:     Reviewed conditions injected plantar fascial bilateral 3 mg Kenalog 5 mill grams Xylocaine dispensed night splint with instructions on usage and discussed long-term physical therapy for this. Also discussed long-term orthotics which we'll make next visit

## 2016-03-08 ENCOUNTER — Ambulatory Visit (INDEPENDENT_AMBULATORY_CARE_PROVIDER_SITE_OTHER): Payer: Managed Care, Other (non HMO) | Admitting: Podiatry

## 2016-03-08 ENCOUNTER — Encounter: Payer: Self-pay | Admitting: Podiatry

## 2016-03-08 DIAGNOSIS — M722 Plantar fascial fibromatosis: Secondary | ICD-10-CM | POA: Diagnosis not present

## 2016-03-08 MED ORDER — TRIAMCINOLONE ACETONIDE 10 MG/ML IJ SUSP
10.0000 mg | Freq: Once | INTRAMUSCULAR | Status: AC
  Administered 2016-03-08: 10 mg

## 2016-03-09 ENCOUNTER — Ambulatory Visit: Payer: Managed Care, Other (non HMO) | Admitting: Podiatry

## 2016-03-09 NOTE — Progress Notes (Signed)
Subjective:     Patient ID: Lisa Stanton, female   DOB: 07-Jun-1965, 50 y.o.   MRN: [id number]  HPI patient states I am improved but I'm still having discomfort in the bottom of both heels. I do think gradually it's getting better and I'm trying to wear better shoes   Review of Systems     Objective:   Physical Exam Neurovascular status intact with continued discomfort plantar heel region bilateral with inflammation fluid buildup but improvement that has occurred over the last period of time    Assessment:     Plantar fasciitis improved but still present bilateral    Plan:     Discussed the importance of shoe gear modifications and utilization of shoes with elevated heels. I reinjected the plantar fascia bilateral 3 mg Kenalog 5 mg Xylocaine and we'll see back and may require orthotics long-term

## 2016-07-24 ENCOUNTER — Other Ambulatory Visit: Payer: Self-pay | Admitting: Physician Assistant

## 2016-08-23 ENCOUNTER — Other Ambulatory Visit: Payer: Self-pay | Admitting: Physician Assistant

## 2016-08-24 ENCOUNTER — Other Ambulatory Visit: Payer: Self-pay | Admitting: Physician Assistant

## 2016-10-25 ENCOUNTER — Other Ambulatory Visit: Payer: Self-pay | Admitting: Physician Assistant

## 2016-11-24 ENCOUNTER — Other Ambulatory Visit: Payer: Self-pay | Admitting: Physician Assistant

## 2016-11-27 ENCOUNTER — Other Ambulatory Visit: Payer: Self-pay | Admitting: Physician Assistant

## 2016-12-27 ENCOUNTER — Other Ambulatory Visit: Payer: Self-pay | Admitting: Physician Assistant

## 2017-01-25 ENCOUNTER — Telehealth: Payer: Self-pay | Admitting: Physician Assistant

## 2017-01-25 MED ORDER — AZILSARTAN-CHLORTHALIDONE 40-25 MG PO TABS: 1.0000 | ORAL_TABLET | Freq: Every day | ORAL | 0 refills | Status: DC

## 2017-01-25 NOTE — Telephone Encounter (Signed)
Called and notified patient that prescription was sent to pharm. Thanks

## 2017-01-25 NOTE — Telephone Encounter (Signed)
Rescheduled pt's appt to 02/05/17 will be out of bp med Sat and need a refill called into [facility] today. Thanks

## 2017-01-25 NOTE — Telephone Encounter (Signed)
Medication sent. Please notify patient.  

## 2017-01-26 ENCOUNTER — Encounter: Payer: Managed Care, Other (non HMO) | Admitting: Physician Assistant

## 2017-02-05 ENCOUNTER — Encounter: Payer: Self-pay | Admitting: Physician Assistant

## 2017-02-05 ENCOUNTER — Ambulatory Visit (INDEPENDENT_AMBULATORY_CARE_PROVIDER_SITE_OTHER): Payer: Managed Care, Other (non HMO) | Admitting: Physician Assistant

## 2017-02-05 VITALS — BP 119/70 | HR 78 | Wt 248.0 lb

## 2017-02-05 DIAGNOSIS — Z131 Encounter for screening for diabetes mellitus: Secondary | ICD-10-CM | POA: Diagnosis not present

## 2017-02-05 DIAGNOSIS — R6889 Other general symptoms and signs: Secondary | ICD-10-CM | POA: Diagnosis not present

## 2017-02-05 DIAGNOSIS — Z0000 Encounter for general adult medical examination without abnormal findings: Secondary | ICD-10-CM | POA: Diagnosis not present

## 2017-02-05 DIAGNOSIS — Z808 Family history of malignant neoplasm of other organs or systems: Secondary | ICD-10-CM | POA: Diagnosis not present

## 2017-02-05 DIAGNOSIS — R499 Unspecified voice and resonance disorder: Secondary | ICD-10-CM

## 2017-02-05 DIAGNOSIS — E042 Nontoxic multinodular goiter: Secondary | ICD-10-CM

## 2017-02-05 DIAGNOSIS — Z13220 Encounter for screening for lipoid disorders: Secondary | ICD-10-CM

## 2017-02-05 DIAGNOSIS — I10 Essential (primary) hypertension: Secondary | ICD-10-CM | POA: Diagnosis not present

## 2017-02-05 DIAGNOSIS — Z23 Encounter for immunization: Secondary | ICD-10-CM

## 2017-02-05 DIAGNOSIS — R0989 Other specified symptoms and signs involving the circulatory and respiratory systems: Secondary | ICD-10-CM

## 2017-02-05 DIAGNOSIS — Z1231 Encounter for screening mammogram for malignant neoplasm of breast: Secondary | ICD-10-CM

## 2017-02-05 DIAGNOSIS — Z1211 Encounter for screening for malignant neoplasm of colon: Secondary | ICD-10-CM

## 2017-02-05 NOTE — Patient Instructions (Addendum)
Keeping You Healthy  Get These Tests  Blood Pressure- Have your blood pressure checked by your healthcare provider at least once a year.  Normal blood pressure is 120/80.  Weight- Have your body mass index (BMI) calculated to screen for obesity.  BMI is a measure of body fat based on height and weight.  You can calculate your own BMI at www.nhlbisupport.com/bmi/  Cholesterol- Have your cholesterol checked every year.  Diabetes- Have your blood sugar checked every year if you have high blood pressure, high cholesterol, a family history of diabetes or if you are overweight.  Pap Test - Have a pap test every 1 to 5 years if you have been sexually active.  If you are older than 65 and recent pap tests have been normal you may not need additional pap tests.  In addition, if you have had a hysterectomy  for benign disease additional pap tests are not necessary.  Mammogram-Yearly mammograms are essential for early detection of breast cancer  Screening for Colon Cancer- Colonoscopy starting at age 50. Screening may begin sooner depending on your family history and other health conditions.  Follow up colonoscopy as directed by your Gastroenterologist.  Screening for Osteoporosis- Screening begins at age 65 with bone density scanning, sooner if you are at higher risk for developing Osteoporosis.  Get these medicines  Calcium with Vitamin D- Your body requires 1200-1500 mg of Calcium a day and 800-1000 IU of Vitamin D a day.  You can only absorb 500 mg of Calcium at a time therefore Calcium must be taken in 2 or 3 separate doses throughout the day.  Hormones- Hormone therapy has been associated with increased risk for certain cancers and heart disease.  Talk to your healthcare provider about if you need relief from menopausal symptoms.  Aspirin- Ask your healthcare provider about taking Aspirin to prevent Heart Disease and Stroke.  Get these Immuniztions  Flu shot- Every fall  Pneumonia shot-  Once after the age of 65; if you are younger ask your healthcare provider if you need a pneumonia shot.  Tetanus- Every ten years.  Zostavax- Once after the age of 60 to prevent shingles.  Take these steps  Don't smoke- Your healthcare provider can help you quit. For tips on how to quit, ask your healthcare provider or go to www.smokefree.gov or call 1-800 QUIT-NOW.  Be physically active- Exercise 5 days a week for a minimum of 30 minutes.  If you are not already physically active, start slow and gradually work up to 30 minutes of moderate physical activity.  Try walking, dancing, bike riding, swimming, etc.  Eat a healthy diet- Eat a variety of healthy foods such as fruits, vegetables, whole grains, low fat milk, low fat cheeses, yogurt, lean meats, chicken, fish, eggs, dried beans, tofu, etc.  For more information go to www.thenutritionsource.org  Dental visit- Brush and floss teeth twice daily; visit your dentist twice a year.  Eye exam- Visit your Optometrist or Ophthalmologist yearly.  Drink alcohol in moderation- Limit alcohol intake to one drink or less a day.  Never drink and drive.  Depression- Your emotional health is as important as your physical health.  If you're feeling down or losing interest in things you normally enjoy, please talk to your healthcare provider.  Seat Belts- can save your life; always wear one  Smoke/Carbon Monoxide detectors- These detectors need to be installed on the appropriate level of your home.  Replace batteries at least once a year.  Violence- If   anyone is threatening or hurting you, please tell your healthcare provider.  Living Will/ Health care power of attorney- Discuss with your healthcare provider and family.  Weight loss medications.  saxenda belviq contrave qsymia  belsomnra

## 2017-02-05 NOTE — Progress Notes (Signed)
Subjective:     Lisa Stanton is a 51 y.o. female and is here for a comprehensive physical exam. The patient reports problems - see below.    Thyroid nodule- followed by Dr. Hartford Poli. We do need a TSH. Family hx of thyroid cancer.   She continues to have "film over throat" and her voice continues to change. She clears her throat multiple times a day. She tried nexium to see if it was heartburn but made no difference. She is taking zyrtec every day.    Social History   Social History  . Marital status: Married    Spouse name: Doren Custard  . Number of children: 0  . Years of education: HS   Occupational History  . Customer Service.  [employer]   Social History Main Topics  . Smoking status: Never Smoker  . Smokeless tobacco: Never Used  . Alcohol use Yes     Comment: 6 times/year  . Drug use: No  . Sexual activity: Yes    Partners: Male   Other Topics Concern  . Not on file   Social History Narrative   2 caffeine per day. Regular exercise, walking.     Health Maintenance  Topic Date Due  . HIV Screening  12/10/1981  . COLONOSCOPY  12/10/2016  . INFLUENZA VACCINE  12/13/2016  . MAMMOGRAM  01/25/2017  . TETANUS/TDAP  12/10/2019    The following portions of the patient's history were reviewed and updated as appropriate: allergies, current medications, past family history, past medical history, past social history, past surgical history and problem list.  Review of Systems Pertinent items noted in HPI and remainder of comprehensive ROS otherwise negative.   Objective:    BP 119/70   Pulse 78   Wt 248 lb (112.5 kg)   LMP 03/15/2009   BMI 41.91 kg/m  General appearance: alert, cooperative, appears stated age and morbidly obese Head: Normocephalic, without obvious abnormality, atraumatic Eyes: conjunctivae/corneas clear. PERRL, EOM's intact. Fundi benign. Ears: normal TM's and external ear canals both ears Nose: Nares normal. Septum midline. Mucosa normal. No  drainage or sinus tenderness. Throat: lips, mucosa, and tongue normal; teeth and gums normal Neck: no adenopathy, no carotid bruit, no JVD, supple, symmetrical, trachea midline and thyroid not enlarged, symmetric, no tenderness/mass/nodules Back: symmetric, no curvature. ROM normal. No CVA tenderness. Lungs: clear to auscultation bilaterally Heart: regular rate and rhythm, S1, S2 normal, no murmur, click, rub or gallop Abdomen: soft, non-tender; bowel sounds normal; no masses,  no organomegaly Extremities: extremities normal, atraumatic, no cyanosis or edema Pulses: 2+ and symmetric Skin: Skin color, texture, turgor normal. No rashes or lesions Lymph nodes: Cervical, supraclavicular, and axillary nodes normal. Neurologic: Alert and oriented X 3, normal strength and tone. Normal symmetric reflexes. Normal coordination and gait    Assessment:    Healthy female exam.      Plan:     Marland KitchenMarland KitchenGwendolyn was seen today for annual exam.  Diagnoses and all orders for this visit:  Routine physical examination -     Lipid Panel w/reflex Direct LDL -     COMPLETE METABOLIC PANEL WITH GFR -     TSH -     T4, free -     CBC -     MM SCREENING BREAST TOMO BILATERAL; Future -     Lipid Panel w/reflex Direct LDL -     COMPLETE METABOLIC PANEL WITH GFR -     CBC -     TSH -  T4, free  Colon cancer screening -     Ambulatory referral to Gastroenterology  Family history of thyroid cancer  Essential hypertension, benign  Multiple thyroid nodules -     TSH -     T4, free -     CBC -     CBC -     TSH  Visit for screening mammogram -     MM SCREENING BREAST TOMO BILATERAL; Future  Screening for diabetes mellitus -     COMPLETE METABOLIC PANEL WITH GFR -     HgB A1c  Screening for lipid disorders -     Lipid Panel w/reflex Direct LDL  Need for immunization against influenza -     Flu Vaccine QUAD 36+ mos IM  Hoarseness or changing voice -     Ambulatory referral to ENT  Throat  clearing -     Ambulatory referral to ENT   .[name] Depression screen PHQ 2/9 02/05/2017  Decreased Interest 1  Down, Depressed, Hopeless 1  PHQ - 2 Score 2  Altered sleeping 3  Tired, decreased energy 3  Change in appetite 1  Feeling bad or failure about yourself  1  Trouble concentrating 1  Moving slowly or fidgety/restless 1  Suicidal thoughts 0  PHQ-9 Score 12  pt declined any intervention for mood today.   Encouraged vitamin D 1000 units and Calcium 1300mg  or 4 servings of dairy a day.    .Discussed low carb diet with 1500 calories and 80g of protein.  Exercising at least 150 minutes a week.  [employer]itness Pal could be a Microbiologist.  Discussed medications to consider.   Order for colonoscopy.  Pap up to date. Pt declined STD testing.  Mammogram scheduled.   ENT referral made. Already tried nexium and zyrtec.      See After Visit Summary for Counseling Recommendations

## 2017-02-07 DIAGNOSIS — R499 Unspecified voice and resonance disorder: Secondary | ICD-10-CM | POA: Insufficient documentation

## 2017-02-07 DIAGNOSIS — R6889 Other general symptoms and signs: Secondary | ICD-10-CM | POA: Insufficient documentation

## 2017-02-07 DIAGNOSIS — R0989 Other specified symptoms and signs involving the circulatory and respiratory systems: Secondary | ICD-10-CM | POA: Insufficient documentation

## 2017-02-11 ENCOUNTER — Other Ambulatory Visit: Payer: Self-pay | Admitting: Physician Assistant

## 2017-02-12 LAB — CBC
HCT: 37 % (ref 35.0–45.0)
Hemoglobin: 12.5 g/dL (ref 11.7–15.5)
MCH: 29.8 pg (ref 27.0–33.0)
MCHC: 33.8 g/dL (ref 32.0–36.0)
MCV: 88.3 fL (ref 80.0–100.0)
MPV: 10 fL (ref 7.5–12.5)
PLATELETS: 355 10*3/uL (ref 140–400)
RBC: 4.19 10*6/uL (ref 3.80–5.10)
RDW: 13.6 % (ref 11.0–15.0)
WBC: 9.3 10*3/uL (ref 3.8–10.8)

## 2017-02-12 LAB — COMPLETE METABOLIC PANEL WITH GFR
AG RATIO: 1.4 (calc) (ref 1.0–2.5)
ALKALINE PHOSPHATASE (APISO): 81 U/L (ref 33–130)
ALT: 24 U/L (ref 6–29)
AST: 21 U/L (ref 10–35)
Albumin: 3.8 g/dL (ref 3.6–5.1)
BUN: 13 mg/dL (ref 7–25)
CALCIUM: 9 mg/dL (ref 8.6–10.4)
CO2: 34 mmol/L — AB (ref 20–32)
CREATININE: 0.61 mg/dL (ref 0.50–1.05)
Chloride: 99 mmol/L (ref 98–110)
GFR, EST NON AFRICAN AMERICAN: 106 mL/min/{1.73_m2} (ref >=60)
GFR, Est African American: 123 mL/min/{1.73_m2} (ref >=60)
Globulin: 2.7 g/dL (calc) (ref 1.9–3.7)
Glucose, Bld: 110 mg/dL — ABNORMAL HIGH (ref 65–99)
POTASSIUM: 3.7 mmol/L (ref 3.5–5.3)
Sodium: 141 mmol/L (ref 135–146)
Total Bilirubin: 0.4 mg/dL (ref 0.2–1.2)
Total Protein: 6.5 g/dL (ref 6.1–8.1)

## 2017-02-12 LAB — LIPID PANEL W/REFLEX DIRECT LDL
CHOL/HDL RATIO: 3.6 (calc) (ref <5.0)
Cholesterol: 171 mg/dL (ref <200)
HDL: 47 mg/dL — ABNORMAL LOW (ref >50)
LDL Cholesterol (Calc): 99 mg/dL (calc)
NON-HDL CHOLESTEROL (CALC): 124 mg/dL (ref <130)
Triglycerides: 152 mg/dL — ABNORMAL HIGH (ref <150)

## 2017-02-12 LAB — T4, FREE: FREE T4: 0.9 ng/dL (ref 0.8–1.8)

## 2017-02-12 LAB — TSH: TSH: 2.29 mIU/L

## 2017-02-12 NOTE — Progress Notes (Signed)
Call pt: cholesterol is at goal? Do you need refill? [facility] for 1 year.  Thyroid looks good. Still waiting on some labs.

## 2017-02-13 LAB — HEMOGLOBIN A1C
HEMOGLOBIN A1C: 5.9 %{Hb} — AB (ref <5.7)
MEAN PLASMA GLUCOSE: 123 (calc)
eAG (mmol/L): 6.8 (calc)

## 2017-02-27 ENCOUNTER — Other Ambulatory Visit: Payer: Self-pay | Admitting: Physician Assistant

## 2017-03-27 LAB — HM COLONOSCOPY

## 2017-03-28 ENCOUNTER — Ambulatory Visit (INDEPENDENT_AMBULATORY_CARE_PROVIDER_SITE_OTHER): Payer: Managed Care, Other (non HMO)

## 2017-03-28 DIAGNOSIS — Z1231 Encounter for screening mammogram for malignant neoplasm of breast: Secondary | ICD-10-CM | POA: Diagnosis not present

## 2017-04-30 ENCOUNTER — Encounter: Payer: Self-pay | Admitting: *Deleted

## 2017-05-09 ENCOUNTER — Encounter: Payer: Self-pay | Admitting: Emergency Medicine

## 2017-05-09 ENCOUNTER — Other Ambulatory Visit: Payer: Self-pay

## 2017-05-09 ENCOUNTER — Emergency Department (INDEPENDENT_AMBULATORY_CARE_PROVIDER_SITE_OTHER)
Admission: EM | Admit: 2017-05-09 | Discharge: 2017-05-09 | Disposition: A | Discharge status: Home / Self Care | Payer: Managed Care, Other (non HMO) | Source: Home / Self Care | Attending: Family Medicine | Admitting: Family Medicine

## 2017-05-09 DIAGNOSIS — S39012A Strain of muscle, fascia and tendon of lower back, initial encounter: Secondary | ICD-10-CM

## 2017-05-09 LAB — POCT URINALYSIS DIP (MANUAL ENTRY)
BILIRUBIN UA: NEGATIVE
GLUCOSE UA: NEGATIVE mg/dL
Ketones, POC UA: NEGATIVE mg/dL
Leukocytes, UA: NEGATIVE
Nitrite, UA: NEGATIVE
PH UA: 7 (ref 5.0–8.0)
Protein Ur, POC: NEGATIVE mg/dL
RBC UA: NEGATIVE
SPEC GRAV UA: 1.02 (ref 1.010–1.025)
Urobilinogen, UA: 0.2 E.U./dL

## 2017-05-09 MED ORDER — PREDNISONE 20 MG PO TABS: ORAL_TABLET | ORAL | 0 refills | Status: DC

## 2017-05-09 MED ORDER — CYCLOBENZAPRINE HCL 10 MG PO TABS: 10.0000 mg | ORAL_TABLET | Freq: Three times a day (TID) | ORAL | 1 refills | Status: DC

## 2017-05-09 NOTE — ED Provider Notes (Signed)
[facility]    CSN: [id number] Arrival date & time: 05/09/17  1807     History   Chief Complaint Chief Complaint  Patient presents with  . Back Pain    low right lateral    HPI Lisa Stanton is a 51 y.o. female.   About 3 weeks ago patient was bending over to shave her legs when she felt pain in her right lower back that has persisted.  She improved somewhat until she slipped on ice one week later, again straining her back.  During the past 4 days she has had pain radiating into her right thigh.   She denies bowel or bladder dysfunction, and no saddle numbness.    The history is provided by the patient.  Back Pain  Location:  Lumbar spine Quality:  Aching Radiates to:  R posterior upper leg Pain severity:  Moderate Pain is:  Same all the time Onset quality:  Sudden Duration:  3 weeks Timing:  Constant Progression:  Worsening Chronicity:  New Context comment:  Occurred while bending over Relieved by:  Nothing Worsened by:  Ambulation and movement Ineffective treatments:  NSAIDs Associated symptoms: no abdominal pain, no abdominal swelling, no bladder incontinence, no bowel incontinence, no dysuria, no fever, no headaches, no leg pain, no numbness, no paresthesias, no pelvic pain, no perianal numbness, no tingling, no weakness and no weight loss   Risk factors: obesity     Past Medical History:  Diagnosis Date  . Arthritis   . Hyperlipidemia   . Hypertension   . Iron deficiency anemia   . Murmur     Patient Active Problem List   Diagnosis Date Noted  . Throat clearing 02/07/2017  . Hoarseness or changing voice 02/07/2017  . Multiple thyroid nodules 01/26/2016  . Hoarse voice quality 01/25/2016  . Family history of thyroid cancer 01/25/2016  . Family history of pancreatic cancer 01/25/2016  . Constipation 01/25/2016  . Morbid obesity ([place]) 01/24/2015  . Pre-diabetes 02/01/2014  . Cough 02/01/2014  . Seasonal allergies 11/19/2013  . Other  and unspecified hyperlipidemia 11/19/2013  . Essential hypertension, benign 11/19/2013  . Bariatric surgery status 03/07/2011  . Status post hysterectomy 03/07/2011  . Incisional hernia without mention of obstruction or gangrene 03/07/2011  . Adnexal pain 11/22/2010  . Allergic rhinitis 10/31/2010  . PELVIC  PAIN 03/14/2010  . Hyperlipidemia 05/14/2009  . ANEMIA-IRON DEFICIENCY 05/14/2009  . HYPERTENSION 05/14/2009  . Chest pain, unspecified 12/18/2008    Past Surgical History:  Procedure Laterality Date  . ABDOMINAL HERNIA REPAIR  09/2010   incarcerated that required surgery.    . ABDOMINAL HYSTERECTOMY    . ABDOMINOPLASTY  2006  . APPENDECTOMY  February 2004  . CARPAL TUNNEL RELEASE  May 1998   Right hand  . CHOLECYSTECTOMY  February 2003  . Cysts removed  September 1993   Left hand  . HERNIA REPAIR    . KNEE SURGERY  July 1986   Left knee  . PARTIAL HYSTERECTOMY  November 2010  . ROUX-EN-Y GASTRIC BYPASS  06/2000  . UMBILICAL HERNIA REPAIR      OB History    Gravida Para Term Preterm AB Living   3 0     3     SAB TAB Ectopic Multiple Live Births   1 2             Home Medications    Prior to Admission medications   Medication Sig Start Date End Date Taking? Authorizing Provider  omeprazole (PRILOSEC) 20 MG capsule Take 20 mg by mouth daily.   Yes [provider]  cholecalciferol (VITAMIN D) 1000 UNITS tablet Take 1,000 Units by mouth daily.    [provider]  cyclobenzaprine (FLEXERIL) 10 MG tablet Take 1 tablet (10 mg total) by mouth 3 (three) times daily. 05/09/17   Kandra Nicolas, MD  EDARBYCLOR 40-25 MG TABS TAKE 1 TABLET ONCE DAILY. 02/27/17   Breeback, Royetta Car, PA-C  medroxyPROGESTERone (PROVERA) 10 MG tablet Take 1 tablet (10 mg total) by mouth daily. Patient not taking: Reported on 02/05/2017 01/25/16   Donella Stade, PA-C  Multiple Vitamin (MULTIVITAMIN) tablet Take 1 tablet by mouth daily.      [provider]  pravastatin  (PRAVACHOL) 80 MG tablet TAKE 1 TABLET ONCE DAILY. 02/12/17   Breeback, Jade L, PA-C  predniSONE (DELTASONE) 20 MG tablet Take one tab by mouth twice daily for 4 days, then one daily. Take with food. 05/09/17   Kandra Nicolas, MD  valACYclovir (VALTREX) 1000 MG tablet Take 2 tablets every 12 hours for one day every exacerbation Patient taking differently: as needed. Take 2 tablets every 12 hours for one day every exacerbation 01/25/16   Donella Stade, PA-C    Family History Family History  Problem Relation Age of Onset  . Osteoporosis Mother   . Rheum arthritis Mother   . Breast cancer Maternal Grandmother   . Colon cancer Maternal Grandfather   . Thyroid cancer Brother 74  . Cancer Brother        thyroid  . Diabetes Father   . Hypertension Father   . Hyperlipidemia Father   . Cancer Father        pancreatic  . Diabetes Paternal Grandfather   . Heart disease Paternal Grandfather   . Cancer Sister        thyroid    Social History Social History   Tobacco Use  . Smoking status: Never Smoker  . Smokeless tobacco: Never Used  Substance Use Topics  . Alcohol use: Yes    Comment: 6 times/year  . Drug use: No     Allergies   Lisinopril and Singulair [montelukast sodium]   Review of Systems Review of Systems  Constitutional: Negative for fever and weight loss.  Gastrointestinal: Negative for abdominal pain and bowel incontinence.  Genitourinary: Negative for bladder incontinence, dysuria and pelvic pain.  Musculoskeletal: Positive for back pain.  Neurological: Negative for tingling, weakness, numbness, headaches and paresthesias.  All other systems reviewed and are negative.    Physical Exam Triage Vital Signs ED Triage Vitals [05/09/17 1857]  Enc Vitals Group     BP 126/81     Pulse Rate 75     Resp 16     Temp 98.8 F (37.1 C)     Temp Source Oral     SpO2 98 %     Weight 247 lb (112 kg)     Height 5\' 5"  (1.651 m)     Head Circumference      Peak Flow       Pain Score 6     Pain Loc      Pain Edu?      Excl. in [place]?    No data found.  Updated Vital Signs BP 126/81 (BP Location: Right Arm)   Pulse 75   Temp 98.8 F (37.1 C) (Oral)   Resp 16   Ht 5\' 5"  (1.651 m)   Wt 247 lb (112  kg)   LMP 03/15/2009   SpO2 98%   BMI 41.10 kg/m   Visual Acuity Right Eye Distance:   Left Eye Distance:   Bilateral Distance:    Right Eye Near:   Left Eye Near:    Bilateral Near:     Physical Exam  Constitutional: She appears well-developed and well-nourished. No distress.  HENT:  Head: Normocephalic and atraumatic.  Right Ear: External ear normal.  Left Ear: External ear normal.  Nose: Nose normal.  Mouth/Throat: Oropharynx is clear and moist.  Eyes: EOM are normal. Pupils are equal, round, and reactive to light.  Neck: Normal range of motion.  Cardiovascular: Normal heart sounds.  Pulmonary/Chest: Breath sounds normal.  Abdominal: There is no tenderness.  Musculoskeletal:       Back:  Back:   Can heel/toe walk and squat without difficulty.  Decreased forward flexion.  Tenderness in the midline and right paraspinous muscles from L3 to Sacral area.  Straight leg raising test is negative.  Sitting knee extension test is negative.  Strength and sensation in the lower extremities is normal.  Patellar and achilles reflexes are normal   Neurological: She is alert.  Skin: Skin is warm and dry. No rash noted.  Nursing note and vitals reviewed.    UC Treatments / Results  Labs (all labs ordered are listed, but only abnormal results are displayed) Labs Reviewed  POCT URINALYSIS DIP (MANUAL ENTRY) - Abnormal; Notable for the following components:      Result Value   Color, UA light yellow (*)    All other components within normal limits    EKG  EKG Interpretation None       Radiology No results found.  Procedures Procedures (including critical care time)  Medications Ordered in UC Medications - No data to  display   Initial Impression / Assessment and Plan / UC Course  I have reviewed the triage vital signs and the nursing notes.  Pertinent labs & imaging results that were available during my care of the patient were reviewed by me and considered in my medical decision making (see chart for details).    Begin prednisone burst/taper.  Rx for Flexeril. Apply ice pack for 20 to 30 minutes, 3 to 4 times daily  Continue until pain and swelling decrease.  Begin range of motion and stretching exercises as tolerated. Followup with Dr. Aundria Mems or Dr. Lynne Leader ([facility]) if not improving about two weeks.     Final Clinical Impressions(s) / UC Diagnoses   Final diagnoses:  Strain of lumbar region, initial encounter    ED Discharge Orders        Ordered    predniSONE (DELTASONE) 20 MG tablet     05/09/17 1925    cyclobenzaprine (FLEXERIL) 10 MG tablet  3 times daily     05/09/17 1925           Kandra Nicolas, MD 05/12/17 1525

## 2017-05-09 NOTE — ED Triage Notes (Signed)
Reports straining right lower back 2-3 weeks ago while shaving legs; intermittent pain since then until 3 days ago when pain increased and has not resolved despite ibuprofen 800mg  po q 8 hours.

## 2017-05-09 NOTE — Discharge Instructions (Signed)
Apply ice pack for 20 to 30 minutes, 3 to 4 times daily  Continue until pain and swelling decrease.  Begin range of motion and stretching exercises as tolerated. 

## 2017-05-11 ENCOUNTER — Other Ambulatory Visit: Payer: Self-pay | Admitting: Physician Assistant

## 2017-05-17 ENCOUNTER — Encounter: Payer: Self-pay | Admitting: Physician Assistant

## 2017-05-18 MED ORDER — OLMESARTAN MEDOXOMIL-HCTZ 40-25 MG PO TABS: 1.0000 | ORAL_TABLET | Freq: Every day | ORAL | 2 refills | Status: DC

## 2017-08-02 ENCOUNTER — Ambulatory Visit (INDEPENDENT_AMBULATORY_CARE_PROVIDER_SITE_OTHER): Payer: Managed Care, Other (non HMO) | Admitting: Family Medicine

## 2017-08-02 VITALS — BP 125/67 | HR 81 | Wt 250.0 lb

## 2017-08-02 DIAGNOSIS — I10 Essential (primary) hypertension: Secondary | ICD-10-CM | POA: Diagnosis not present

## 2017-08-02 MED ORDER — TELMISARTAN-HCTZ 80-25 MG PO TABS: 1.0000 | ORAL_TABLET | Freq: Every day | ORAL | 1 refills | Status: DC

## 2017-08-02 NOTE — Progress Notes (Signed)
Agree with documentation as above.   Stana Bayon, MD  

## 2017-08-02 NOTE — Progress Notes (Signed)
   Subjective:    Patient ID: Lisa Stanton, female    DOB: [birth year], 52 y.o.   MRN: [id number]  HPI  Lisa Stanton is here for a blood pressure check. The insurance company would not pay for [employer]. She has been switched to Olmesartan-HCTZ 40-25 mg once daily. Denies chest pain, headaches or palpitations. She has notice some shortness of breath since she started the new blood pressure medication. The shortness of breath happens at random times.    Review of Systems     Objective:   Physical Exam        Assessment & Plan:  Hypertension - Side effects of shortness of breath with Benicar-HCTZ. Per Dr Madilyn Fireman switched to telmisartan/hct 80-25 mg. Patient advised to schedule a follow up in 2 weeks with Jade. Also advised to call if any problems with medication.

## 2017-08-10 ENCOUNTER — Other Ambulatory Visit: Payer: Self-pay | Admitting: Physician Assistant

## 2017-08-22 ENCOUNTER — Encounter: Payer: Self-pay | Admitting: Physician Assistant

## 2017-08-22 ENCOUNTER — Ambulatory Visit (INDEPENDENT_AMBULATORY_CARE_PROVIDER_SITE_OTHER): Payer: Managed Care, Other (non HMO) | Admitting: Physician Assistant

## 2017-08-22 VITALS — BP 140/71 | HR 82 | Ht 65.0 in | Wt 251.0 lb

## 2017-08-22 DIAGNOSIS — I10 Essential (primary) hypertension: Secondary | ICD-10-CM

## 2017-08-22 DIAGNOSIS — R0989 Other specified symptoms and signs involving the circulatory and respiratory systems: Secondary | ICD-10-CM

## 2017-08-22 DIAGNOSIS — R093 Abnormal sputum: Secondary | ICD-10-CM

## 2017-08-22 DIAGNOSIS — R7303 Prediabetes: Secondary | ICD-10-CM

## 2017-08-22 DIAGNOSIS — Z79899 Other long term (current) drug therapy: Secondary | ICD-10-CM | POA: Diagnosis not present

## 2017-08-22 DIAGNOSIS — R5383 Other fatigue: Secondary | ICD-10-CM

## 2017-08-22 DIAGNOSIS — R6889 Other general symptoms and signs: Secondary | ICD-10-CM | POA: Diagnosis not present

## 2017-08-22 MED ORDER — TELMISARTAN-HCTZ 80-25 MG PO TABS: 1.0000 | ORAL_TABLET | Freq: Every day | ORAL | 1 refills | Status: DC

## 2017-08-22 NOTE — Patient Instructions (Signed)
Restart flonase.  Try melatonin for sleep.  mucinex as needed for cough/sputum.  Will call with labs.  Consider sleep apnea study.  Consider referral to obesity medicine.

## 2017-08-22 NOTE — Progress Notes (Signed)
Breathing   Stop singulair cough more.

## 2017-08-22 NOTE — Progress Notes (Signed)
Subjective:    Patient ID: Lisa Stanton, female    DOB: 05/25/1965, 52 y.o.   MRN: [id number]  HPI Nyeema presents today for a blood pressure follow up. She states that she is doing well on her medication at this time. She had not yet taken her medication this morning as she normally takes it when she gets to work because of the diuretic component. She denies any CP, palpitations, SOB, headache or vision changes.   She also mentions that she is having difficulty with sleeping, primarily getting to sleep and then feeling really tired the next day. She denies any significant changes in her life. She has taken zQuil on the weekends but does not during the week because she doesn't want to over sleep. She is not interested in a sleep study at this time.   She also mentions her chronic productive cough and that she is following up with ENT. They have primarily recommended weight loss. She is taking daily zyrtec but has not taken any nose spray for years as it dried her out. She felt like singular caused her to cough more and thus stopped.   .. Active Ambulatory Problems    Diagnosis Date Noted  . Hyperlipidemia 05/14/2009  . ANEMIA-IRON DEFICIENCY 05/14/2009  . Essential hypertension 05/14/2009  . PELVIC  PAIN 03/14/2010  . Allergic rhinitis 10/31/2010  . Adnexal pain 11/22/2010  . Seasonal allergies 11/19/2013  . Other and unspecified hyperlipidemia 11/19/2013  . Essential hypertension, benign 11/19/2013  . Pre-diabetes 02/01/2014  . Cough 02/01/2014  . Morbid obesity ([place]) 01/24/2015  . Hoarse voice quality 01/25/2016  . Family history of thyroid cancer 01/25/2016  . Family history of pancreatic cancer 01/25/2016  . Constipation 01/25/2016  . Bariatric surgery status 03/07/2011  . Status post hysterectomy 03/07/2011  . Incisional hernia without mention of obstruction or gangrene 03/07/2011  . Chest pain, unspecified 12/18/2008  . Multiple thyroid nodules 01/26/2016  . Throat  clearing 02/07/2017  . Hoarseness or changing voice 02/07/2017   Resolved Ambulatory Problems    Diagnosis Date Noted  . EUSTACHIAN TUBE DYSFUNCTION 05/14/2009  . DIZZINESS 09/07/2009   Past Medical History:  Diagnosis Date  . Arthritis   . Hyperlipidemia   . Hypertension   . Iron deficiency anemia   . Murmur       Review of Systems  Constitutional: Positive for fatigue. Negative for unexpected weight change.  Respiratory: Positive for cough.   Cardiovascular: Negative for chest pain and palpitations.  Psychiatric/Behavioral: Positive for sleep disturbance.       Objective:   Physical Exam  Constitutional: She is oriented to person, place, and time. She appears well-developed and well-nourished.  obese  HENT:  Head: Normocephalic and atraumatic.  Right Ear: External ear normal.  Left Ear: External ear normal.  Eyes: Conjunctivae are normal.  Cardiovascular: Normal rate, regular rhythm and normal heart sounds.  Pulmonary/Chest: Effort normal and breath sounds normal. No respiratory distress. She has no wheezes.  Neurological: She is alert and oriented to person, place, and time.  Skin: Skin is warm and dry.  Psychiatric: She has a normal mood and affect. Her behavior is normal.          Assessment & Plan:  Marland KitchenMarland KitchenGwendolyn was seen today for hypertension.  Diagnoses and all orders for this visit:  Prediabetes -     Hemoglobin A1c  No energy -     B12 and Folate Panel -     COMPLETE METABOLIC PANEL WITH GFR  Medication management -     COMPLETE METABOLIC PANEL WITH GFR  Morbid obesity ([facility])  Increased sputum production  Essential hypertension, benign -     telmisartan-hydrochlorothiazide (MICARDIS HCT) 80-25 MG tablet; Take 1 tablet by mouth daily.  Throat clearing  A!C added in labs to evaluate diabetes progression.   Blood pressure is just barely elevated today.  She has not taken her medications today.  Certainly this appears to be a good sign.   Refilled for 6 months.  Ordered CMP to evaluate kidney and liver function.  Certainly discussed obesity as it could be related to her shortness of breath and per ENT as well.  .Discussed low carb diet with 1500 calories and 80g of protein.  Exercising at least 150 minutes a week.  [employer] could be a Microbiologist.  She was not ready to start medications at this time.  She has started phentermine in the past but she was not motivated to come to her monthly rechecks.  I offered her a suggestion of obesity medicine to help work with this chronically.  She declined today but will consider in the future.  Patient continues to be very tired which I do feel like contributes to weight gain as well.  She has had fasting labs in the past.  I would like to check a B12 today.  I would also like for patient to consider a sleep apnea study.  If she is not getting great rest at night could be contributing to fatigue and weight gain during the day.  As far as medication I would like for her to try melatonin at night for sleep.  Certainly there are other medications that we can try for sleep if not effective with melatonin.  Please follow-up as needed.  For her throat clearing and increased sputum production.  I would like for her to continue on Zyrtec and Flonase 1 spray each nostril twice a day.  She could also use Mucinex intermittently to see if it helps with mucus production.  We could always consider pulmonology referral if she would like.  [name].Spent 30 minutes with patient and greater than 50 percent of visit spent counseling patient regarding treatment plan.

## 2017-08-23 LAB — COMPLETE METABOLIC PANEL WITH GFR
AG Ratio: 1.5 (calc) (ref 1.0–2.5)
ALBUMIN MSPROF: 4 g/dL (ref 3.6–5.1)
ALKALINE PHOSPHATASE (APISO): 89 U/L (ref 33–130)
ALT: 23 U/L (ref 6–29)
AST: 21 U/L (ref 10–35)
BUN: 15 mg/dL (ref 7–25)
CO2: 33 mmol/L — AB (ref 20–32)
CREATININE: 0.54 mg/dL (ref 0.50–1.05)
Calcium: 9.3 mg/dL (ref 8.6–10.4)
Chloride: 104 mmol/L (ref 98–110)
GFR, Est African American: 128 mL/min/{1.73_m2} (ref >=60)
GFR, Est Non African American: 110 mL/min/{1.73_m2} (ref >=60)
GLUCOSE: 103 mg/dL — AB (ref 65–99)
Globulin: 2.6 g/dL (calc) (ref 1.9–3.7)
Potassium: 4.3 mmol/L (ref 3.5–5.3)
Sodium: 143 mmol/L (ref 135–146)
Total Bilirubin: 0.4 mg/dL (ref 0.2–1.2)
Total Protein: 6.6 g/dL (ref 6.1–8.1)

## 2017-08-23 LAB — HEMOGLOBIN A1C
Hgb A1c MFr Bld: 6.1 % of total Hgb — ABNORMAL HIGH (ref <5.7)
Mean Plasma Glucose: 128 (calc)
eAG (mmol/L): 7.1 (calc)

## 2017-08-23 LAB — B12 AND FOLATE PANEL
Folate: >24 ng/mL
VITAMIN B 12: 382 pg/mL (ref 200–1100)

## 2017-08-23 NOTE — Progress Notes (Signed)
Call pt: still in pre diabetes range but elevated from last year. b12 low normal. I would start b12 sublingual 1080mcg daily.  Kidney, liver, glucose look good.

## 2018-01-28 ENCOUNTER — Other Ambulatory Visit: Payer: Self-pay | Admitting: Physician Assistant

## 2018-02-05 ENCOUNTER — Telehealth: Payer: Self-pay | Admitting: Physician Assistant

## 2018-02-05 ENCOUNTER — Ambulatory Visit (INDEPENDENT_AMBULATORY_CARE_PROVIDER_SITE_OTHER): Payer: BLUE CROSS/BLUE SHIELD

## 2018-02-05 ENCOUNTER — Encounter: Payer: Self-pay | Admitting: Physician Assistant

## 2018-02-05 ENCOUNTER — Ambulatory Visit (INDEPENDENT_AMBULATORY_CARE_PROVIDER_SITE_OTHER): Payer: BLUE CROSS/BLUE SHIELD | Admitting: Physician Assistant

## 2018-02-05 VITALS — BP 112/67 | HR 68 | Ht 65.0 in | Wt 218.0 lb

## 2018-02-05 DIAGNOSIS — M5442 Lumbago with sciatica, left side: Secondary | ICD-10-CM | POA: Diagnosis not present

## 2018-02-05 DIAGNOSIS — Z0000 Encounter for general adult medical examination without abnormal findings: Secondary | ICD-10-CM

## 2018-02-05 DIAGNOSIS — Z23 Encounter for immunization: Secondary | ICD-10-CM

## 2018-02-05 DIAGNOSIS — E782 Mixed hyperlipidemia: Secondary | ICD-10-CM

## 2018-02-05 DIAGNOSIS — Z6833 Body mass index (BMI) 33.0-33.9, adult: Secondary | ICD-10-CM

## 2018-02-05 DIAGNOSIS — M5136 Other intervertebral disc degeneration, lumbar region: Secondary | ICD-10-CM | POA: Diagnosis not present

## 2018-02-05 DIAGNOSIS — I10 Essential (primary) hypertension: Secondary | ICD-10-CM | POA: Diagnosis not present

## 2018-02-05 DIAGNOSIS — M5441 Lumbago with sciatica, right side: Secondary | ICD-10-CM | POA: Diagnosis not present

## 2018-02-05 DIAGNOSIS — E6609 Other obesity due to excess calories: Secondary | ICD-10-CM | POA: Diagnosis not present

## 2018-02-05 DIAGNOSIS — M47816 Spondylosis without myelopathy or radiculopathy, lumbar region: Secondary | ICD-10-CM | POA: Insufficient documentation

## 2018-02-05 DIAGNOSIS — F5101 Primary insomnia: Secondary | ICD-10-CM

## 2018-02-05 MED ORDER — KETOROLAC TROMETHAMINE 60 MG/2ML IM SOLN
60.0000 mg | Freq: Once | INTRAMUSCULAR | Status: AC
  Administered 2018-02-05: 60 mg via INTRAMUSCULAR

## 2018-02-05 MED ORDER — SUVOREXANT 10 MG PO TABS: 1.0000 | ORAL_TABLET | Freq: Every day | ORAL | 2 refills | Status: DC

## 2018-02-05 MED ORDER — CYCLOBENZAPRINE HCL 10 MG PO TABS: 10.0000 mg | ORAL_TABLET | Freq: Three times a day (TID) | ORAL | 0 refills | Status: DC | PRN

## 2018-02-05 MED ORDER — PRAVASTATIN SODIUM 80 MG PO TABS: 80.0000 mg | ORAL_TABLET | Freq: Every day | ORAL | 3 refills | Status: DC

## 2018-02-05 MED ORDER — AZILSARTAN-CHLORTHALIDONE 40-12.5 MG PO TABS: 1.0000 | ORAL_TABLET | Freq: Every day | ORAL | 3 refills | Status: DC

## 2018-02-05 NOTE — Progress Notes (Signed)
Subjective:     Lisa Stanton is a 52 y.o. female and is here for a comprehensive physical exam. The patient reports problems - see below.    Pt has episodes of low back pain. She has not had a lot of problem with it since ED visit last December. No recent injury. She has lost 50lbs. She is doing weight watchers. Denies any lower extremity weakness, saddle anesthesia, or bowel or bladder dysfunction. She had recent low back pain that started a few days ago but no known injury. At times pain can radiate into anterior thighs. She is taking ibuprofen 800mg  twice a day but concerned about taking so much. Ibuprofen works better than [employer].   She continues to have problems with sleeping. This has been an issue for years. She did not like ambien. She hast tried unisom and melatonin. She has tried essential oils and keeping room cold.    Social History   Socioeconomic History  . Marital status: Married    Spouse name: Doren Custard  . Number of children: 0  . Years of education: HS  . Highest education level: Not on file  Occupational History  . Occupation: Therapist, art.     Employer: [employer]  Social Needs  . Financial resource strain: Not on file  . Food insecurity:    Worry: Not on file    Inability: Not on file  . Transportation needs:    Medical: Not on file    Non-medical: Not on file  Tobacco Use  . Smoking status: Never Smoker  . Smokeless tobacco: Never Used  Substance and Sexual Activity  . Alcohol use: Yes    Comment: 6 times/year  . Drug use: No  . Sexual activity: Yes    Partners: Male  Lifestyle  . Physical activity:    Days per week: Not on file    Minutes per session: Not on file  . Stress: Not on file  Relationships  . Social connections:    Talks on phone: Not on file    Gets together: Not on file    Attends religious service: Not on file    Active member of club or organization: Not on file    Attends meetings of clubs or organizations: Not on file     Relationship status: Not on file  . Intimate partner violence:    Fear of current or ex partner: Not on file    Emotionally abused: Not on file    Physically abused: Not on file    Forced sexual activity: Not on file  Other Topics Concern  . Not on file  Social History Narrative   2 caffeine per day. Regular exercise, walking.     Health Maintenance  Topic Date Due  . HIV Screening  02/07/2018 (Originally 12/10/1980)  . INFLUENZA VACCINE  09/02/2018 (Originally 12/13/2017)  . MAMMOGRAM  03/28/2018  . TETANUS/TDAP  12/10/2019  . COLONOSCOPY  03/28/2027    The following portions of the patient's history were reviewed and updated as appropriate: allergies, current medications, past family history, past medical history, past social history, past surgical history and problem list.  Review of Systems Pertinent items noted in HPI and remainder of comprehensive ROS otherwise negative.   Objective:    BP 112/67   Pulse 68   Ht 5\' 5"  (1.651 m)   Wt 218 lb (98.9 kg)   LMP 03/15/2009   BMI 36.28 kg/m  General appearance: alert, cooperative, appears stated age and moderately obese Head:  Normocephalic, without obvious abnormality, atraumatic Eyes: conjunctivae/corneas clear. PERRL, EOM's intact. Fundi benign. Ears: normal TM's and external ear canals both ears Nose: Nares normal. Septum midline. Mucosa normal. No drainage or sinus tenderness. Throat: lips, mucosa, and tongue normal; teeth and gums normal Neck: no adenopathy, no carotid bruit, no JVD, supple, symmetrical, trachea midline and thyroid not enlarged, symmetric, no tenderness/mass/nodules Back: symmetric, no curvature. ROM normal. No CVA tenderness. Lungs: clear to auscultation bilaterally Heart: regular rate and rhythm, S1, S2 normal, no murmur, click, rub or gallop Abdomen: soft, non-tender; bowel sounds normal; no masses,  no organomegaly Extremities: extremities normal, atraumatic, no cyanosis or edema Pulses: 2+ and  symmetric Skin: Skin color, texture, turgor normal. No rashes or lesions Lymph nodes: Cervical, supraclavicular, and axillary nodes normal. Neurologic: Alert and oriented X 3, normal strength and tone. Normal symmetric reflexes. Normal coordination and gait    Assessment:    Healthy female exam.      Plan:     Marland KitchenMarland KitchenGwendolyn was seen today for annual exam.  Diagnoses and all orders for this visit:  Routine physical examination -     COMPLETE METABOLIC PANEL WITH GFR -     Lipid Panel w/reflex Direct LDL -     TSH  Class 1 obesity due to excess calories without serious comorbidity with body mass index (BMI) of 33.0 to 33.9 in adult  Essential hypertension, benign -     Azilsartan-Chlorthalidone (EDARBYCLOR) 40-12.5 MG TABS; Take 1 tablet by mouth daily.  Acute bilateral low back pain with bilateral sciatica -     cyclobenzaprine (FLEXERIL) 10 MG tablet; Take 1 tablet (10 mg total) by mouth 3 (three) times daily as needed for muscle spasms. -     DG Lumbar Spine Complete -     ketorolac (TORADOL) injection 60 mg  Need for immunization against influenza -     Flu Vaccine QUAD 36+ mos IM  Mixed hyperlipidemia -     pravastatin (PRAVACHOL) 80 MG tablet; Take 1 tablet (80 mg total) by mouth daily.  Primary insomnia -     Suvorexant (BELSOMRA) 10 MG TABS; Take 1 tablet by mouth daily.  DDD (degenerative disc disease), lumbar  Facet arthritis of lumbar region   .[name] Depression screen [facility] 2/9 02/05/2018 08/22/2017 02/05/2017  Decreased Interest 1 2 1   Down, Depressed, Hopeless 1 1 1   PHQ - 2 Score 2 3 2   Altered sleeping 3 3 3   Tired, decreased energy 1 2 3   Change in appetite 2 1 1   Feeling bad or failure about yourself  1 1 1   Trouble concentrating 1 1 1   Moving slowly or fidgety/restless 0 0 1  Suicidal thoughts 0 0 0  PHQ-9 Score 10 11 12   Difficult doing work/chores Not difficult at all Very difficult -   .[name] Discussed 150 minutes of exercise a week.  Encouraged  vitamin D 1000 units and Calcium 1300mg  or 4 servings of dairy a day.  Colonoscopy up to date.  Mammogram due in November 2019.  Discussed shingrix. Declined today. Flu shot given today.   Refilled edarbyclor decreased dose due to low BP. Follow up in 3 months.   Will get xray of low back. As needed NSAID. Exercises given. Continue to lose weight. Will start PT. Flexeril as needed. Use lots of heat.   For sleep start belsomnra. Discussed medications. Coupon card given.follow up in 3 months.   See After Visit Summary for Counseling Recommendations

## 2018-02-05 NOTE — Telephone Encounter (Signed)
Approvedtoday  Effective from 02/05/2018 through 02/04/2019. Pharmacy aware.

## 2018-02-05 NOTE — Patient Instructions (Addendum)

## 2018-02-05 NOTE — Progress Notes (Signed)
Call pt: you have a slight curve at the spine with diffuse degenerative changes over disc and facets. Treatment plan stays the same as we discussed.

## 2018-02-05 NOTE — Addendum Note (Signed)
Addended by: Donella Stade on: 02/05/2018 02:34 PM   Modules accepted: Orders

## 2018-02-06 LAB — LIPID PANEL W/REFLEX DIRECT LDL
CHOLESTEROL: 172 mg/dL (ref <200)
HDL: 43 mg/dL — AB (ref >50)
LDL Cholesterol (Calc): 103 mg/dL (calc) — ABNORMAL HIGH
NON-HDL CHOLESTEROL (CALC): 129 mg/dL (ref <130)
Total CHOL/HDL Ratio: 4 (calc) (ref <5.0)
Triglycerides: 156 mg/dL — ABNORMAL HIGH (ref <150)

## 2018-02-06 LAB — COMPLETE METABOLIC PANEL WITH GFR
AG RATIO: 1.5 (calc) (ref 1.0–2.5)
ALT: 17 U/L (ref 6–29)
AST: 18 U/L (ref 10–35)
Albumin: 4.1 g/dL (ref 3.6–5.1)
Alkaline phosphatase (APISO): 92 U/L (ref 33–130)
BUN: 12 mg/dL (ref 7–25)
CO2: 32 mmol/L (ref 20–32)
Calcium: 9.7 mg/dL (ref 8.6–10.4)
Chloride: 100 mmol/L (ref 98–110)
Creat: 0.65 mg/dL (ref 0.50–1.05)
GFR, Est African American: 119 mL/min/{1.73_m2} (ref >=60)
GFR, Est Non African American: 103 mL/min/{1.73_m2} (ref >=60)
GLOBULIN: 2.8 g/dL (ref 1.9–3.7)
Glucose, Bld: 100 mg/dL — ABNORMAL HIGH (ref 65–99)
POTASSIUM: 4 mmol/L (ref 3.5–5.3)
SODIUM: 141 mmol/L (ref 135–146)
Total Bilirubin: 0.3 mg/dL (ref 0.2–1.2)
Total Protein: 6.9 g/dL (ref 6.1–8.1)

## 2018-02-06 LAB — TSH: TSH: 1.78 mIU/L

## 2018-02-06 NOTE — Progress Notes (Signed)
Call pt: cholesterol is stable. Looks good.  Kidney, liver, glucose look good.  Thyroid normal.   Good labs. Keep up the weight loss.

## 2018-02-07 ENCOUNTER — Other Ambulatory Visit: Payer: Self-pay

## 2018-02-07 DIAGNOSIS — I10 Essential (primary) hypertension: Secondary | ICD-10-CM

## 2018-02-07 MED ORDER — AZILSARTAN-CHLORTHALIDONE 40-12.5 MG PO TABS: 1.0000 | ORAL_TABLET | Freq: Every day | ORAL | 3 refills | Status: DC

## 2018-02-07 NOTE — Progress Notes (Signed)
It was sent on 9/24. We decided to decrease medication. Lower dose was sent. Please check with pharmacy. Sent electronically.

## 2018-02-11 ENCOUNTER — Other Ambulatory Visit: Payer: Self-pay | Admitting: Physician Assistant

## 2018-02-20 ENCOUNTER — Encounter: Payer: Self-pay | Admitting: Rehabilitative and Restorative Service Providers"

## 2018-02-20 ENCOUNTER — Ambulatory Visit: Payer: BLUE CROSS/BLUE SHIELD | Admitting: Rehabilitative and Restorative Service Providers"

## 2018-02-20 DIAGNOSIS — M545 Low back pain, unspecified: Secondary | ICD-10-CM

## 2018-02-20 DIAGNOSIS — R29898 Other symptoms and signs involving the musculoskeletal system: Secondary | ICD-10-CM | POA: Diagnosis not present

## 2018-02-20 NOTE — Patient Instructions (Signed)
Abdominal Bracing With Pelvic Floor (Hook-Lying)    With neutral spine, tighten pelvic floor and abdominals sucking belly button to back bone; tighten muscles in the low back.  10 sec hold Repeat _10__ times. Do __several _ times a day. Progress to do this in sitting standing walking and with functional activities   Piriformis Stretch   Lying on back, pull right knee toward opposite shoulder. Hold 30 seconds. Repeat 3 times. Do 2-3 sessions per day.   Quads / HF, Prone KNEE: Quadriceps - Prone    Place strap around ankle. Bring ankle toward buttocks. Press hip into surface. Hold 30 seconds. Repeat 3 times per session. Do 2-3 sessions per day.  TENS UNIT: This is helpful for muscle pain and spasm.   Search and Purchase a TENS 7000 2nd edition at www.tenspros.com. It should be less than $30.     TENS unit instructions: Do not shower or bathe with the unit on Turn the unit off before removing electrodes or batteries If the electrodes lose stickiness add a drop of water to the electrodes after they are disconnected from the unit and place on plastic sheet. If you continued to have difficulty, call the TENS unit company to purchase more electrodes. Do not apply lotion on the skin area prior to use. Make sure the skin is clean and dry as this will help prolong the life of the electrodes. After use, always check skin for unusual red areas, rash or other skin difficulties. If there are any skin problems, does not apply electrodes to the same area. Never remove the electrodes from the unit by pulling the wires. Do not use the TENS unit or electrodes other than as directed. Do not change electrode placement without consultating your therapist or physician. Keep 2 fingers with between each electrode.   Sleeping on Back  Place pillow under knees. A pillow with cervical support and a roll around waist are also helpful. Copyright  VHI. All rights reserved.  Sleeping on Side Place  pillow between knees. Use cervical support under neck and a roll around waist as needed. Copyright  VHI. All rights reserved.   Sleeping on Stomach   If this is the only desirable sleeping position, place pillow under lower legs, and under stomach or chest as needed.  Posture - Sitting   Sit upright, head facing forward. Try using a roll to support lower back. Keep shoulders relaxed, and avoid rounded back. Keep hips level with knees. Avoid crossing legs for long periods. Stand to Sit / Sit to Stand   To sit: Bend knees to lower self onto front edge of chair, then scoot back on seat. To stand: Reverse sequence by placing one foot forward, and scoot to front of seat. Use rocking motion to stand up.   Work Height and Reach  Ideal work height is no more than 2 to 4 inches below elbow level when standing, and at elbow level when sitting. Reaching should be limited to arm's length, with elbows slightly bent.  Bending  Bend at hips and knees, not back. Keep feet shoulder-width apart.    Posture - Standing   Good posture is important. Avoid slouching and forward head thrust. Maintain curve in low back and align ears over shoul- ders, hips over ankles.  Alternating Positions   Alternate tasks and change positions frequently to reduce fatigue and muscle tension. Take rest breaks. Computer Work   Position work to Programmer, multimedia. Use proper work and seat height. Keep shoulders  back and down, wrists straight, and elbows at right angles. Use chair that provides full back support. Add footrest and lumbar roll as needed.  Getting Into / Out of Car  Lower self onto seat, scoot back, then bring in one leg at a time. Reverse sequence to get out.  Dressing  Lie on back to pull socks or slacks over feet, or sit and bend leg while keeping back straight.    Housework - Sink  Place one foot on ledge of cabinet under sink when standing at sink for prolonged periods.   Pushing /  Pulling  Pushing is preferable to pulling. Keep back in proper alignment, and use leg muscles to do the work.  Deep Squat   Squat and lift with both arms held against upper trunk. Tighten stomach muscles without holding breath. Use smooth movements to avoid jerking.  Avoid Twisting   Avoid twisting or bending back. Pivot around using foot movements, and bend at knees if needed when reaching for articles.  Carrying Luggage   Distribute weight evenly on both sides. Use a cart whenever possible. Do not twist trunk. Move body as a unit.   Lifting Principles .Maintain proper posture and head alignment. .Slide object as close as possible before lifting. .Move obstacles out of the way. .Test before lifting; ask for help if too heavy. .Tighten stomach muscles without holding breath. .Use smooth movements; do not jerk. .Use legs to do the work, and pivot with feet. .Distribute the work load symmetrically and close to the center of trunk. .Push instead of pull whenever possible.   Ask For Help   Ask for help and delegate to others when possible. Coordinate your movements when lifting together, and maintain the low back curve.  Log Roll   Lying on back, bend left knee and place left arm across chest. Roll all in one movement to the right. Reverse to roll to the left. Always move as one unit. Housework - Sweeping  Use long-handled equipment to avoid stooping.   Housework - Wiping  Position yourself as close as possible to reach work surface. Avoid straining your back.  Laundry - Unloading Wash   To unload small items at bottom of washer, lift leg opposite to arm being used to reach.  [place] close to area to be raked. Use arm movements to do the work. Keep back straight and avoid twisting.     Cart  When reaching into cart with one arm, lift opposite leg to keep back straight.   Getting Into / Out of Bed  Lower self to lie down on one side by  raising legs and lowering head at the same time. Use arms to assist moving without twisting. Bend both knees to roll onto back if desired. To sit up, start from lying on side, and use same move-ments in reverse. Housework - Vacuuming  Hold the vacuum with arm held at side. Step back and forth to move it, keeping head up. Avoid twisting.   Laundry - IT consultant so that bending and twisting can be avoided.   Laundry - Unloading Dryer  Squat down to reach into clothes dryer or use a reacher.  Gardening - Weeding / Probation officer or Kneel. Knee pads may be helpful.

## 2018-02-20 NOTE — Therapy (Signed)
[place], [place], [address] Phone: [phone]   Fax:  [phone]  Physical Therapy Evaluation  Patient Details  Name: Lisa Stanton MRN: [id number] Date of Birth: 01/16/1966 Referring Provider (PT): Iran Planas PA - C   Encounter Date: 02/20/2018  PT End of Session - 02/20/18 1211    Visit Number  1    Number of Visits  12    Date for PT Re-Evaluation  04/03/18    PT Start Time  0800    PT Stop Time  0859    PT Time Calculation (min)  59 min    Activity Tolerance  Patient tolerated treatment well       Past Medical History:  Diagnosis Date  . Arthritis   . Hyperlipidemia   . Hypertension   . Iron deficiency anemia   . Murmur     Past Surgical History:  Procedure Laterality Date  . ABDOMINAL HERNIA REPAIR  09/2010   incarcerated that required surgery.    . ABDOMINAL HYSTERECTOMY    . ABDOMINOPLASTY  2006  . APPENDECTOMY  February 2004  . CARPAL TUNNEL RELEASE  May 1998   Right hand  . CHOLECYSTECTOMY  February 2003  . Cysts removed  September 1993   Left hand  . HERNIA REPAIR    . KNEE SURGERY  July 1986   Left knee  . PARTIAL HYSTERECTOMY  November 2010  . ROUX-EN-Y GASTRIC BYPASS  06/2000  . UMBILICAL HERNIA REPAIR      There were no vitals filed for this visit.   Subjective Assessment - 02/20/18 0808    Subjective  Patient reports LBP over the past 4-5 months with gradual onset. Symptoms have incresaed and are now constant.     Pertinent History  LBP intermittently x 25 yrs chiropractic care, PT, meds; HTN; arthritis; Lt knee scope ~ ~ 30 yrs ago; gastric bypass; hernia repair sx; gall bladder sx; hysterectomy; carpal tunnel Rt; wrist cyst Lt     Diagnostic tests  DDD; arthritis     Patient Stated Goals  get rid of the back pain     Currently in Pain?  Yes    Pain Score  4     Pain Location  Back    Pain Orientation  Right;Left   mostly on Rt    Pain Descriptors  / Indicators  Dull   at times sharp    Pain Type  Acute pain;Chronic pain    Pain Radiating Towards  into the posterior Rt hip; at times anterior thighs bilat with prolonged standing     Pain Onset  More than a month ago    Pain Frequency  Constant    Aggravating Factors   prolonged standing/walking/sitting; any prolonged activities; bending; lifting; reaching; difficulty sleeping     Pain Relieving Factors  meds; rest lying on side with pillow btn legs         [facility] PT Assessment - 02/20/18 0001      Assessment   Medical Diagnosis  LBP; Rt hip pain     Referring Provider (PT)  Iran Planas PA - C    Onset Date/Surgical Date  09/12/17    Hand Dominance  Right    Next MD Visit  PRN     Prior Therapy  yes chiropractic; PT ~ 20 yrs ago       Precautions   Precautions  None      Balance Screen  Has the patient fallen in the past 6 months  No    Has the patient had a decrease in activity level because of a fear of falling?   No    Is the patient reluctant to leave their home because of a fear of falling?   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     Leisure  household chores; gardening; walking dogs ~ 15 min daily       Observation/Other Assessments   Focus on Therapeutic Outcomes (FOTO)   43% limitation       Sensation   Additional Comments  intermittent tingling anterior thighs when standing occurs ~ 1x/wk resolves with mivement in ~ 10-15 min       Posture/Postural Control   Posture Comments  rounded shoulders; slightly flexed forward at hips       AROM   Overall AROM Comments  bilat hips/knees WFL's - except tight hip flexors      Lumbar Flexion  75%    Lumbar Extension  75%    Lumbar - Right Side Bend  60% pulling    Lumbar - Left Side Bend  60% pulling and discomfort     Lumbar - Right Rotation  55%    Lumbar - Left Rotation  55%      Strength   Overall Strength Comments  5/5 bilat LE's       Flexibility   Hamstrings  90 deg bilat     Quadriceps  tight bilat     Piriformis  tight bilat       Palpation   Spinal mobility  hypomobile lumbar spine with CPA and Rt lateral mobs     SI assessment   slightly higher Rt PSIS than Lt     Palpation comment  tightness Rt QL/lats/piriformis       Special Tests    Special Tests  --   Pt has 3-4 finger rectus diastasis   Other special tests  (-) SLR/Fabers                Objective measurements completed on examination: See above findings.      [facility] Adult PT Treatment/Exercise - 02/20/18 0001      Lumbar Exercises: Stretches   Quad Stretch  Right;Left;2 reps;30 seconds   prone with strap    Piriformis Stretch  Right;Left;2 reps;30 seconds   supine with strap travell      Lumbar Exercises: Supine   Other Supine Lumbar Exercises  3 part core 10 sec x 10 requiring verbal and tactile cues       Moist Heat Therapy   Number Minutes Moist Heat  20 Minutes    Moist Heat Location  Lumbar Spine      Electrical Stimulation   Electrical Stimulation Location  Rt lumbar to Rt hip     Electrical Stimulation Action  IFC    Electrical Stimulation Parameters  to tolerance    Electrical Stimulation Goals  Pain;Tone  PT Education - 02/20/18 0843    Education Details  HEP; TENS; back care     Person(s) Educated  Patient    Methods  Explanation;Demonstration;Tactile cues;Verbal cues;Handout    Comprehension  Verbalized understanding;Returned demonstration;Verbal cues required;Tactile cues required          PT Long Term Goals - 02/20/18 1222      PT LONG TERM GOAL #1   Title  Decrease pain by 50-75% allowing patient to perform  normal functional and work activities with minimal pain or discomfort 04/03/18    Time  6    Period  Weeks    Status  New      PT LONG TERM GOAL #2   Title  Increase trunk ROM/mobility with patient to demonstrate ~ 80% of normal trunk mobility with minimal pain or discomfort 04/03/18    Time  6    Period  Weeks    Status  New      PT LONG TERM GOAL #3   Title  Improve core strength and stability allowing patient to sit, stand, walk for 20 min without increased pain 04/03/18    Time  6    Period  Weeks    Status  New      PT LONG TERM GOAL #4   Title  Improve FOTO to </= 33% limitation 04/03/18    Time  6    Period  Weeks      PT LONG TERM GOAL #5   Title  Independent in HEP 04/03/18    Time  6    Period  Weeks    Status  New             Plan - 02/20/18 1212    Clinical Impression Statement  Lisa Stanton presents with LBP for the past 4-5 months with no known injury. She has a history of intermittent back pain over the past 20+ years. She has postural changes; decreased trunk and LE mobility/ROM; muscular tightness to palpation; pain on a daily basis. Patient will benefit from PT to address problems identified.     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     Clinical Decision Making  Low    Rehab Potential  Good    PT Frequency  2x / week    PT Duration  6 weeks    PT Treatment/Interventions  Patient/family education;ADLs/Self Care Home Management;Cryotherapy;Electrical Stimulation;Iontophoresis 4mg /ml Dexamethasone;Moist Heat;Dry needling;Manual techniques;Neuromuscular re-education;Therapeutic activities;Ultrasound;Therapeutic exercise    PT Next Visit Plan  review HEP; add ball release work; trial of DN v deep tissue work Rt posterior  hip/piriformis/glut med and QL/lats/paraspinal     Consulted and Agree with Plan of Care  Patient       Patient will benefit from skilled therapeutic intervention in order to improve the following deficits and impairments:  Postural dysfunction, Improper body mechanics, Pain, Increased fascial restricitons, Increased muscle spasms, Decreased mobility, Decreased range of motion, Decreased strength, Decreased activity tolerance  Visit Diagnosis: Acute bilateral low back pain without sciatica - Plan: PT plan of care cert/re-cert  Other symptoms and signs involving the musculoskeletal system - Plan: PT plan of care cert/re-cert     Problem List Patient Active Problem List   Diagnosis Date Noted  .  Acute bilateral low back pain with bilateral sciatica 02/05/2018  . Facet arthritis of lumbar region 02/05/2018  . DDD (degenerative disc disease), lumbar 02/05/2018  . Primary insomnia 02/05/2018  . Increased sputum production 08/22/2017  . No energy 08/22/2017  . Throat clearing 02/07/2017  . Hoarseness or changing voice 02/07/2017  . Multiple thyroid nodules 01/26/2016  . Hoarse voice quality 01/25/2016  . Family history of thyroid cancer 01/25/2016  . Family history of pancreatic cancer 01/25/2016  . Constipation 01/25/2016  . Class 1 obesity due to excess calories without serious comorbidity with body mass index (BMI) of 33.0 to 33.9 in adult 01/24/2015  . Prediabetes 02/01/2014  . Cough 02/01/2014  . Seasonal allergies 11/19/2013  . Other and unspecified hyperlipidemia 11/19/2013  . Essential hypertension, benign 11/19/2013  . Bariatric surgery status 03/07/2011  . Status post hysterectomy 03/07/2011  . Incisional hernia without mention of obstruction or gangrene 03/07/2011  . Adnexal pain 11/22/2010  . Allergic rhinitis 10/31/2010  . PELVIC  PAIN 03/14/2010  . Hyperlipidemia 05/14/2009  . ANEMIA-IRON DEFICIENCY 05/14/2009  . Essential hypertension 05/14/2009  . Chest pain,  unspecified 12/18/2008    Celyn Nilda Simmer PT, MPH 02/20/2018, 12:31 PM  [facility] [place], [place], [address] Phone: [phone]   Fax:  [phone]  Name: Lisa Stanton MRN: [id number] Date of Birth: [birth year]

## 2018-02-25 ENCOUNTER — Ambulatory Visit: Payer: BLUE CROSS/BLUE SHIELD | Admitting: Physical Therapy

## 2018-02-25 ENCOUNTER — Encounter: Payer: Self-pay | Admitting: Physical Therapy

## 2018-02-25 DIAGNOSIS — M545 Low back pain, unspecified: Secondary | ICD-10-CM

## 2018-02-25 DIAGNOSIS — R29898 Other symptoms and signs involving the musculoskeletal system: Secondary | ICD-10-CM

## 2018-02-25 NOTE — Patient Instructions (Signed)

## 2018-02-25 NOTE — Therapy (Signed)
[place], [place], [address] Phone: [phone]   Fax:  [phone]  Physical Therapy Treatment  Patient Details  Name: Lisa Stanton MRN: [id number] Date of Birth: 12-27-1965 Referring Provider (PT): Iran Planas PA - C   Encounter Date: 02/25/2018  PT End of Session - 02/25/18 0846    Visit Number  2    Number of Visits  12    Date for PT Re-Evaluation  04/03/18    PT Start Time  0802    PT Stop Time  0856    PT Time Calculation (min)  54 min    Activity Tolerance  Patient tolerated treatment well       Past Medical History:  Diagnosis Date  . Arthritis   . Hyperlipidemia   . Hypertension   . Iron deficiency anemia   . Murmur     Past Surgical History:  Procedure Laterality Date  . ABDOMINAL HERNIA REPAIR  09/2010   incarcerated that required surgery.    . ABDOMINAL HYSTERECTOMY    . ABDOMINOPLASTY  2006  . APPENDECTOMY  February 2004  . CARPAL TUNNEL RELEASE  May 1998   Right hand  . CHOLECYSTECTOMY  February 2003  . Cysts removed  September 1993   Left hand  . HERNIA REPAIR    . KNEE SURGERY  July 1986   Left knee  . PARTIAL HYSTERECTOMY  November 2010  . ROUX-EN-Y GASTRIC BYPASS  06/2000  . UMBILICAL HERNIA REPAIR      There were no vitals filed for this visit.  Subjective Assessment - 02/25/18 0804    Subjective  upper back is painful now ("it's overriding the lower back")    Pertinent History  LBP intermittently x 25 yrs chiropractic care, PT, meds; HTN; arthritis; Lt knee scope ~ ~ 30 yrs ago; gastric bypass; hernia repair sx; gall bladder sx; hysterectomy; carpal tunnel Rt; wrist cyst Lt     Patient Stated Goals  get rid of the back pain     Currently in Pain?  Yes    Pain Score  4     Pain Location  Back    Pain Orientation  Right;Left;Lower    Pain Descriptors / Indicators  Dull    Pain Type  Acute pain;Chronic pain    Pain Onset  More than a month ago    Pain Frequency  Constant    Aggravating Factors   prolonged standing/walking/sitting, any proloned activities; bending; lifting, reaching, difficulty sleeping    Pain Relieving Factors  meds, rest, lying on side with pillow between legs    Multiple Pain Sites  Yes    Pain Score  6    Pain Location  Back    Pain Orientation  Upper    Pain Descriptors / Indicators  Tightness    Pain Type  Acute pain    Pain Onset  In the past 7 days    Pain Frequency  Constant    Aggravating Factors   mornings, coughing    Pain Relieving Factors  rest                       [facility] Adult PT Treatment/Exercise - 02/25/18 0808      Self-Care   Self-Care  Other Self-Care Comments    Other Self-Care Comments   use of ball for myofascial release      Exercises   Exercises  Lumbar  Lumbar Exercises: Stretches   Sports administrator  Right;Left;2 reps;30 seconds   prone with strap    Piriformis Stretch  Right;Left;2 reps;30 seconds   supine with travell      Lumbar Exercises: Aerobic   Nustep  L5 x 6 min   PT present to discuss progress     Lumbar Exercises: Supine   Ab Set  10 reps   10 sec     Moist Heat Therapy   Number Minutes Moist Heat  15 Minutes    Moist Heat Location  Lumbar Spine      Electrical Stimulation   Electrical Stimulation Location  Rt lumbar to Rt hip     Electrical Stimulation Action  IFC to tolerance    Electrical Stimulation Goals  Pain;Tone      Manual Therapy   Manual Therapy  Soft tissue mobilization    Manual therapy comments  skilled palpation and monitoring of soft tissue during DN    Soft tissue mobilization  Rt QL, lumbar paraspinals, glutes       Trigger Point Dry Needling - 02/25/18 0842    Consent Given?  Yes    Education Handout Provided  Yes    Muscles Treated Upper Body  Longissimus;Quadratus Lumborum    Muscles Treated Lower Body  Gluteus maximus    Longissimus Response  Twitch response elicited;Palpable increased muscle length     Gluteus Maximus Response  Twitch response elicited;Palpable increased muscle length   glute med          PT Education - 02/25/18 0845    Education Details  DN, myofascial ball work    [employer]) Educated  Patient    Methods  Explanation;Handout;Demonstration    Comprehension  Verbalized understanding;Need further instruction          PT Long Term Goals - 02/20/18 1222      PT LONG TERM GOAL #1   Title  Decrease pain by 50-75% allowing patient to perform normal functional and work activities with minimal pain or discomfort 04/03/18    Time  6    Period  Weeks    Status  New      PT LONG TERM GOAL #2   Title  Increase trunk ROM/mobility with patient to demonstrate ~ 80% of normal trunk mobility with minimal pain or discomfort 04/03/18    Time  6    Period  Weeks    Status  New      PT LONG TERM GOAL #3   Title  Improve core strength and stability allowing patient to sit, stand, walk for 20 min without increased pain 04/03/18    Time  6    Period  Weeks    Status  New      PT LONG TERM GOAL #4   Title  Improve FOTO to </= 33% limitation 04/03/18    Time  6    Period  Weeks      PT LONG TERM GOAL #5   Title  Independent in HEP 04/03/18    Time  6    Period  Weeks    Status  New            Plan - 02/25/18 0846    Clinical Impression Statement  Pt tolerated session well with good response to DN and manual work today.  Independent with initial HEP, but need to progress core and hip stability exercises.  No goals met as only 2nd visit.     Rehab Potential  Good    PT Frequency  2x / week    PT Duration  6 weeks    PT Treatment/Interventions  Patient/family education;ADLs/Self Care Home Management;Cryotherapy;Electrical Stimulation;Iontophoresis 68m/ml Dexamethasone;Moist Heat;Dry needling;Manual techniques;Neuromuscular re-education;Therapeutic activities;Ultrasound;Therapeutic exercise    PT Next Visit Plan  assess response to DN and manual, progress HEP     Consulted and Agree with Plan of Care  Patient       Patient will benefit from skilled therapeutic intervention in order to improve the following deficits and impairments:  Postural dysfunction, Improper body mechanics, Pain, Increased fascial restricitons, Increased muscle spasms, Decreased mobility, Decreased range of motion, Decreased strength, Decreased activity tolerance  Visit Diagnosis: Acute bilateral low back pain without sciatica  Other symptoms and signs involving the musculoskeletal system     Problem List Patient Active Problem List   Diagnosis Date Noted  . Acute bilateral low back pain with bilateral sciatica 02/05/2018  . Facet arthritis of lumbar region 02/05/2018  . DDD (degenerative disc disease), lumbar 02/05/2018  . Primary insomnia 02/05/2018  . Increased sputum production 08/22/2017  . No energy 08/22/2017  . Throat clearing 02/07/2017  . Hoarseness or changing voice 02/07/2017  . Multiple thyroid nodules 01/26/2016  . Hoarse voice quality 01/25/2016  . Family history of thyroid cancer 01/25/2016  . Family history of pancreatic cancer 01/25/2016  . Constipation 01/25/2016  . Class 1 obesity due to excess calories without serious comorbidity with body mass index (BMI) of 33.0 to 33.9 in adult 01/24/2015  . Prediabetes 02/01/2014  . Cough 02/01/2014  . Seasonal allergies 11/19/2013  . Other and unspecified hyperlipidemia 11/19/2013  . Essential hypertension, benign 11/19/2013  . Bariatric surgery status 03/07/2011  . Status post hysterectomy 03/07/2011  . Incisional hernia without mention of obstruction or gangrene 03/07/2011  . Adnexal pain 11/22/2010  . Allergic rhinitis 10/31/2010  . PELVIC  PAIN 03/14/2010  . Hyperlipidemia 05/14/2009  . ANEMIA-IRON DEFICIENCY 05/14/2009  . Essential hypertension 05/14/2009  . Chest pain, unspecified 12/18/2008      SLaureen Abrahams PT, DPT 02/25/18 8:50 AM    [facility] [address]Phone: [phone]  Fax:  [phone] Name: GYisel MegillMRN: [id number] of Birth: [birth year]

## 2018-02-27 ENCOUNTER — Encounter: Payer: Self-pay | Admitting: Physical Therapy

## 2018-02-27 ENCOUNTER — Ambulatory Visit: Payer: BLUE CROSS/BLUE SHIELD | Admitting: Physical Therapy

## 2018-02-27 DIAGNOSIS — M545 Low back pain, unspecified: Secondary | ICD-10-CM

## 2018-02-27 DIAGNOSIS — R29898 Other symptoms and signs involving the musculoskeletal system: Secondary | ICD-10-CM

## 2018-02-27 NOTE — Patient Instructions (Signed)
Arm / Leg Lift: Opposite (Prone)    Lift leg and opposite arm __2__ inches from floor, keeping knee locked.  Reach in opposite directions with arm/leg. Don't hold your breath.  Repeat __5__ times per set. Do __2-3__ sets per session. Do _3___ sessions per week.  Scapula Adduction With Pectorals, Mid-Range    Stand in doorframe with palms against frame and arms at 90. Lean forward and squeeze shoulder blades. Hold _15__ seconds. Repeat __2_ times per session. Do _1-2__ sessions per day.  Shoulder (Scapula) Retraction    Pull shoulders back, squeezing shoulder blades together. Hold 5-10 seconds. Repeat _5___ times per session. Do __several__ sessions per week. Position: any

## 2018-02-27 NOTE — Therapy (Addendum)
[facility] [place], [place], [address] Phone: [phone]   Fax:  [phone]  Physical Therapy Treatment  Patient Details  Name: Lisa Stanton MRN: [id number] Date of Birth: 1965-10-16 Referring Provider (PT): Iran Planas PA - C   Encounter Date: 02/27/2018  PT End of Session - 02/27/18 0736    Visit Number  3    Number of Visits  12    Date for PT Re-Evaluation  04/03/18    PT Start Time  0718    PT Stop Time  0806    PT Time Calculation (min)  48 min    Activity Tolerance  Patient tolerated treatment well    Behavior During Therapy  [facility] for tasks assessed/performed       Past Medical History:  Diagnosis Date  . Arthritis   . Hyperlipidemia   . Hypertension   . Iron deficiency anemia   . Murmur     Past Surgical History:  Procedure Laterality Date  . ABDOMINAL HERNIA REPAIR  09/2010   incarcerated that required surgery.    . ABDOMINAL HYSTERECTOMY    . ABDOMINOPLASTY  2006  . APPENDECTOMY  February 2004  . CARPAL TUNNEL RELEASE  May 1998   Right hand  . CHOLECYSTECTOMY  February 2003  . Cysts removed  September 1993   Left hand  . HERNIA REPAIR    . KNEE SURGERY  July 1986   Left knee  . PARTIAL HYSTERECTOMY  November 2010  . ROUX-EN-Y GASTRIC BYPASS  06/2000  . UMBILICAL HERNIA REPAIR      There were no vitals filed for this visit.  Subjective Assessment - 02/27/18 0720    Subjective  Pt reports the spot on her Lt shoulder blade area has been bothering her sunday when she was walking dog (dog jerked her on leash). Her low back still feels tight, but better.     Patient Stated Goals  get rid of the back pain     Currently in Pain?  Yes    Pain Score  1     Pain Location  Back    Pain Orientation  Left;Right;Lower    Pain Descriptors / Indicators  Dull    Pain Score  2    Pain Location  Shoulder    Pain Orientation  Left         [facility] PT Assessment - 02/27/18 0001       Assessment   Medical Diagnosis  LBP; Rt hip pain     Referring Provider (PT)  Iran Planas PA - C    Onset Date/Surgical Date  09/12/17    Hand Dominance  Right    Next MD Visit  PRN     Prior Therapy  yes chiropractic; PT ~ 20 yrs ago         [facility] Adult PT Treatment/Exercise - 02/27/18 0001      Exercises   Exercises  Shoulder;Lumbar      Lumbar Exercises: Stretches   Sports administrator  Right;Left;2 reps;30 seconds    Quad Stretch Limitations  (one rep in sitting as alternative)    Piriformis Stretch  Right;Left;2 reps;30 seconds   supine with travell    Other Lumbar Stretch Exercise  seated thoracic ext over back of chair x 5 sec x 3 reps      Lumbar Exercises: Aerobic   Nustep  L4 x 6 min   PTA present to discuss progress  Lumbar Exercises: Seated   Other Seated Lumbar Exercises  ab set with hands pressing into lap x 5 sec x 5 reps    Other Seated Lumbar Exercises  side stretch x 5 sec each direction (arm overhead)      Lumbar Exercises: Supine   Bridge  10 reps;5 seconds      Lumbar Exercises: Prone   Opposite Arm/Leg Raise  Right arm/Left leg;Left arm/Right leg;5 reps   2 sets     Shoulder Exercises: Stretch   Other Shoulder Stretches  3 position doorway stretch x 20 sec x 2 reps each position.       Moist Heat Therapy   Number Minutes Moist Heat  15 Minutes    Moist Heat Location  Lumbar Spine      Electrical Stimulation   Electrical Stimulation Location  Lt rhomboid/  Rt glute     Electrical Stimulation Action  premod to each area    Electrical Stimulation Parameters  to pt tolerance    Electrical Stimulation Goals  Pain             PT Education - 02/27/18 0805    Education Details  HEP    Person(s) Educated  Patient    Methods  Explanation;Demonstration;Verbal cues;Handout    Comprehension  Verbalized understanding;Returned demonstration          PT Long Term Goals - 02/20/18 1222      PT LONG TERM GOAL #1   Title  Decrease pain by  50-75% allowing patient to perform normal functional and work activities with minimal pain or discomfort 04/03/18    Time  6    Period  Weeks    Status  New      PT LONG TERM GOAL #2   Title  Increase trunk ROM/mobility with patient to demonstrate ~ 80% of normal trunk mobility with minimal pain or discomfort 04/03/18    Time  6    Period  Weeks    Status  New      PT LONG TERM GOAL #3   Title  Improve core strength and stability allowing patient to sit, stand, walk for 20 min without increased pain 04/03/18    Time  6    Period  Weeks    Status  New      PT LONG TERM GOAL #4   Title  Improve FOTO to </= 33% limitation 04/03/18    Time  6    Period  Weeks      PT LONG TERM GOAL #5   Title  Independent in HEP 04/03/18    Time  6    Period  Weeks    Status  New            Plan - 02/27/18 4403    Clinical Impression Statement  Pt had positive response to DN last session.  She tolerated all exercises well, reporting decrease in pain in shoulder and low back.  Further reduction in pain with estim/MHP at end of session.  Progressing well towards goals.     Rehab Potential  Good    PT Frequency  2x / week    PT Duration  6 weeks    PT Treatment/Interventions  Patient/family education;ADLs/Self Care Home Management;Cryotherapy;Electrical Stimulation;Iontophoresis 91m/ml Dexamethasone;Moist Heat;Dry needling;Manual techniques;Neuromuscular re-education;Therapeutic activities;Ultrasound;Therapeutic exercise    PT Next Visit Plan  Progress lumbar stabilization and stretches.      Consulted and Agree with Plan of Care  Patient  Patient will benefit from skilled therapeutic intervention in order to improve the following deficits and impairments:  Postural dysfunction, Improper body mechanics, Pain, Increased fascial restricitons, Increased muscle spasms, Decreased mobility, Decreased range of motion, Decreased strength, Decreased activity tolerance  Visit Diagnosis: Acute  bilateral low back pain without sciatica  Other symptoms and signs involving the musculoskeletal system     Problem List Patient Active Problem List   Diagnosis Date Noted  . Acute bilateral low back pain with bilateral sciatica 02/05/2018  . Facet arthritis of lumbar region 02/05/2018  . DDD (degenerative disc disease), lumbar 02/05/2018  . Primary insomnia 02/05/2018  . Increased sputum production 08/22/2017  . No energy 08/22/2017  . Throat clearing 02/07/2017  . Hoarseness or changing voice 02/07/2017  . Multiple thyroid nodules 01/26/2016  . Hoarse voice quality 01/25/2016  . Family history of thyroid cancer 01/25/2016  . Family history of pancreatic cancer 01/25/2016  . Constipation 01/25/2016  . Class 1 obesity due to excess calories without serious comorbidity with body mass index (BMI) of 33.0 to 33.9 in adult 01/24/2015  . Prediabetes 02/01/2014  . Cough 02/01/2014  . Seasonal allergies 11/19/2013  . Other and unspecified hyperlipidemia 11/19/2013  . Essential hypertension, benign 11/19/2013  . Bariatric surgery status 03/07/2011  . Status post hysterectomy 03/07/2011  . Incisional hernia without mention of obstruction or gangrene 03/07/2011  . Adnexal pain 11/22/2010  . Allergic rhinitis 10/31/2010  . PELVIC  PAIN 03/14/2010  . Hyperlipidemia 05/14/2009  . ANEMIA-IRON DEFICIENCY 05/14/2009  . Essential hypertension 05/14/2009  . Chest pain, unspecified 12/18/2008   Kerin Perna, PTA 02/27/18 8:06 AM  [facility] [place], [place], [address] Phone: [phone]   Fax:  [phone]  Name: Lisa Stanton MRN: [id number] Date of Birth: 01/29/1966  PHYSICAL THERAPY DISCHARGE SUMMARY  Visits from Start of Care: 3  Current functional level related to goals / functional outcomes: See progress note for discharge status    Remaining deficits: Unknown    Education /  Equipment: HEP Plan: Patient agrees to discharge.  Patient goals were partially met. Patient is being discharged due to not returning since the last visit.  ?????     Celyn P. Helene Kelp PT, MPH 04/23/18 9:50 AM

## 2018-03-04 ENCOUNTER — Encounter: Payer: BLUE CROSS/BLUE SHIELD | Admitting: Physical Therapy

## 2018-03-06 ENCOUNTER — Encounter: Payer: BLUE CROSS/BLUE SHIELD | Admitting: Physical Therapy

## 2018-03-11 ENCOUNTER — Encounter: Payer: BLUE CROSS/BLUE SHIELD | Admitting: Rehabilitative and Restorative Service Providers"

## 2018-03-13 ENCOUNTER — Encounter: Payer: BLUE CROSS/BLUE SHIELD | Admitting: Physical Therapy

## 2018-03-18 ENCOUNTER — Encounter: Payer: BLUE CROSS/BLUE SHIELD | Admitting: Physical Therapy

## 2018-03-20 ENCOUNTER — Encounter: Payer: BLUE CROSS/BLUE SHIELD | Admitting: Physical Therapy

## 2018-04-24 ENCOUNTER — Encounter: Payer: Self-pay | Admitting: Physician Assistant

## 2018-04-24 NOTE — Telephone Encounter (Signed)
Approvedtoday Carole Binning) Effective from 04/24/2018 through 04/22/2021. Patient aware. Pharmacy aware.

## 2018-05-03 ENCOUNTER — Other Ambulatory Visit: Payer: Self-pay | Admitting: Physician Assistant

## 2018-05-03 DIAGNOSIS — F5101 Primary insomnia: Secondary | ICD-10-CM

## 2018-05-03 NOTE — Telephone Encounter (Signed)
Requesting RF on Belsom[facility]  Last OV 02/05/18, no future appt scheduled  Last RF 02/05/18 for #30 with 2 RF   RX pended, please review and send if appropriate.

## 2018-06-06 ENCOUNTER — Encounter (INDEPENDENT_AMBULATORY_CARE_PROVIDER_SITE_OTHER): Payer: Self-pay

## 2018-06-10 ENCOUNTER — Ambulatory Visit (INDEPENDENT_AMBULATORY_CARE_PROVIDER_SITE_OTHER): Payer: BLUE CROSS/BLUE SHIELD | Admitting: Family Medicine

## 2018-06-10 ENCOUNTER — Encounter (INDEPENDENT_AMBULATORY_CARE_PROVIDER_SITE_OTHER): Payer: Self-pay | Admitting: Family Medicine

## 2018-06-10 VITALS — BP 115/75 | HR 70 | Temp 97.5°F | Ht 63.0 in | Wt 221.0 lb

## 2018-06-10 DIAGNOSIS — E7849 Other hyperlipidemia: Secondary | ICD-10-CM

## 2018-06-10 DIAGNOSIS — R5383 Other fatigue: Secondary | ICD-10-CM

## 2018-06-10 DIAGNOSIS — E559 Vitamin D deficiency, unspecified: Secondary | ICD-10-CM

## 2018-06-10 DIAGNOSIS — Z1331 Encounter for screening for depression: Secondary | ICD-10-CM

## 2018-06-10 DIAGNOSIS — R0602 Shortness of breath: Secondary | ICD-10-CM

## 2018-06-10 DIAGNOSIS — Z0289 Encounter for other administrative examinations: Secondary | ICD-10-CM

## 2018-06-10 DIAGNOSIS — R739 Hyperglycemia, unspecified: Secondary | ICD-10-CM

## 2018-06-10 DIAGNOSIS — E538 Deficiency of other specified B group vitamins: Secondary | ICD-10-CM

## 2018-06-10 DIAGNOSIS — Z9189 Other specified personal risk factors, not elsewhere classified: Secondary | ICD-10-CM | POA: Diagnosis not present

## 2018-06-10 DIAGNOSIS — Z6839 Body mass index (BMI) 39.0-39.9, adult: Secondary | ICD-10-CM

## 2018-06-11 LAB — COMPREHENSIVE METABOLIC PANEL
A/G RATIO: 1.7 (ref 1.2–2.2)
ALT: 23 IU/L (ref 0–32)
AST: 20 IU/L (ref 0–40)
Albumin: 4.3 g/dL (ref 3.8–4.9)
Alkaline Phosphatase: 96 IU/L (ref 39–117)
BILIRUBIN TOTAL: 0.4 mg/dL (ref 0.0–1.2)
BUN/Creatinine Ratio: 16 (ref 9–23)
BUN: 10 mg/dL (ref 6–24)
CHLORIDE: 98 mmol/L (ref 96–106)
CO2: 26 mmol/L (ref 20–29)
Calcium: 9.4 mg/dL (ref 8.7–10.2)
Creatinine, Ser: 0.61 mg/dL (ref 0.57–1.00)
GFR calc non Af Amer: 105 mL/min/{1.73_m2} (ref >59)
GFR, EST AFRICAN AMERICAN: 121 mL/min/{1.73_m2} (ref >59)
GLOBULIN, TOTAL: 2.5 g/dL (ref 1.5–4.5)
Glucose: 83 mg/dL (ref 65–99)
POTASSIUM: 3.9 mmol/L (ref 3.5–5.2)
SODIUM: 139 mmol/L (ref 134–144)
Total Protein: 6.8 g/dL (ref 6.0–8.5)

## 2018-06-11 LAB — CBC WITH DIFFERENTIAL
BASOS ABS: 0.1 10*3/uL (ref 0.0–0.2)
Basos: 1 %
EOS (ABSOLUTE): 0.1 10*3/uL (ref 0.0–0.4)
Eos: 1 %
HEMOGLOBIN: 12.5 g/dL (ref 11.1–15.9)
IMMATURE GRANS (ABS): 0 10*3/uL (ref 0.0–0.1)
Immature Granulocytes: 0 %
Lymphocytes Absolute: 3.4 10*3/uL — ABNORMAL HIGH (ref 0.7–3.1)
Lymphs: 36 %
MCH: 29.4 pg (ref 26.6–33.0)
MCHC: 33.2 g/dL (ref 31.5–35.7)
MCV: 89 fL (ref 79–97)
Monocytes Absolute: 0.6 10*3/uL (ref 0.1–0.9)
Monocytes: 6 %
NEUTROS ABS: 5.3 10*3/uL (ref 1.4–7.0)
Neutrophils: 56 %
RBC: 4.25 x10E6/uL (ref 3.77–5.28)
RDW: 14.5 % (ref 11.7–15.4)
WBC: 9.4 10*3/uL (ref 3.4–10.8)

## 2018-06-11 LAB — VITAMIN D 25 HYDROXY (VIT D DEFICIENCY, FRACTURES): VIT D 25 HYDROXY: 30.8 ng/mL (ref 30.0–100.0)

## 2018-06-11 LAB — HEMOGLOBIN A1C
ESTIMATED AVERAGE GLUCOSE: 120 mg/dL
Hgb A1c MFr Bld: 5.8 % — ABNORMAL HIGH (ref 4.8–5.6)

## 2018-06-11 LAB — T3: T3, Total: 122 ng/dL (ref 71–180)

## 2018-06-11 LAB — LIPID PANEL WITH LDL/HDL RATIO
CHOLESTEROL TOTAL: 189 mg/dL (ref 100–199)
HDL: 53 mg/dL (ref >39)
LDL Calculated: 108 mg/dL — ABNORMAL HIGH (ref 0–99)
LDl/HDL Ratio: 2 ratio (ref 0.0–3.2)
Triglycerides: 139 mg/dL (ref 0–149)
VLDL CHOLESTEROL CAL: 28 mg/dL (ref 5–40)

## 2018-06-11 LAB — FOLATE

## 2018-06-11 LAB — ANEMIA PANEL
FERRITIN: 62 ng/mL (ref 15–150)
FOLATE, HEMOLYSATE: 520 ng/mL
FOLATE, RBC: 1379 ng/mL (ref >498)
Hematocrit: 37.7 % (ref 34.0–46.6)
Iron Saturation: 27 % (ref 15–55)
Iron: 96 ug/dL (ref 27–159)
RETIC CT PCT: 2 % (ref 0.6–2.6)
Total Iron Binding Capacity: 353 ug/dL (ref 250–450)
UIBC: 257 ug/dL (ref 131–425)
Vitamin B-12: >2000 pg/mL — ABNORMAL HIGH (ref 232–1245)

## 2018-06-11 LAB — TSH: TSH: 2.11 u[IU]/mL (ref 0.450–4.500)

## 2018-06-11 LAB — T4, FREE: FREE T4: 0.84 ng/dL (ref 0.82–1.77)

## 2018-06-11 LAB — PREALBUMIN: PREALBUMIN: 28 mg/dL (ref 10–36)

## 2018-06-11 LAB — INSULIN, RANDOM: INSULIN: 24.1 u[IU]/mL (ref 2.6–24.9)

## 2018-06-11 NOTE — Progress Notes (Signed)
Office: [phone]  /  Fax: [phone]   HPI:   Chief Complaint: OBESITY    Lisa Stanton (MR# [id number]) is a 53 y.o. female who presents on 06/10/2018 for obesity evaluation and treatment. Current BMI is Body mass index is 39.15 kg/m. Lisa Stanton has been struggling with her weight for many years and has been unsuccessful in either losing weight, maintaining weight loss, or reaching her healthy weight goal. Lisa Stanton is status post gastric bypass surgery in 2002. Her weight went from 307 to 154 in 1 year. She started regaining weight 2 years later and she is now at 221 pounds. Lisa Stanton has minimal restriction and still has some dumping syndrome with too many simple carbs.     Lisa Stanton attended our information session and states she is currently in the action stage of change and ready to dedicate time achieving and maintaining a healthier weight. Lisa Stanton is interested in becoming our patient and working on intensive lifestyle modifications including (but not limited to) diet, exercise and weight loss.    Lisa Stanton states her family eats meals together she thinks her family will eat healthier with her she struggles with family and or coworkers weight loss sabotage her desired weight loss is 56 lbs she has been heavy most of  her life she started gaining weight in her early 20's her heaviest weight ever was 334 lbs. she has significant food cravings issues  she snacks frequently in the evenings she skips meals sometimes she is frequently drinking liquids with calories she frequently makes poor food choices she has binge eating behaviors she struggles with emotional eating    Fatigue Lisa Stanton feels her energy is lower than it should be. This has worsened with weight gain and has not worsened recently. Lisa Stanton admits to daytime somnolence and admits to waking up still tired. Patient is at risk for obstructive sleep apnea. Patient generally gets 6 or 7 hours of sleep per night,  and states they generally have restless sleep. Snoring is not present. Apneic episodes are not present. Epworth Sleepiness Score is 3.   Dyspnea on exertion Lisa Stanton notes increasing shortness of breath with exercising and seems to be worsening over time with weight gain. She notes getting out of breath sooner with activity than she used to. This has not gotten worse recently. Lisa Stanton denies orthopnea.  Hyperlipidemia Lisa Stanton has hyperlipidemia and has been trying to improve her cholesterol levels with intensive lifestyle modification including a low saturated fat diet, exercise and weight loss. She is on Pravastatin 80mg . She would like to improve with diet. Lisa Stanton denies any chest pain or myalgias.  Vitamin D deficiency Lisa Stanton has a diagnosis of vitamin D deficiency. She is on lifelong vit D supplementation due to status post gastric bypass surgery.   Vitamin B12 deficiency Lisa Stanton has a diagnosis of B12 insufficiency and notes fatigue. This is not a new diagnosis. Lisa Stanton is not a vegetarian and does have a previous diagnosis of pernicious anemia. She has a history of gastric bypass weight loss surgery and will be on lifelong supplementation.  Hyperglycemia Lisa Stanton has a history of some elevated blood glucose readings without a diagnosis of diabetes before weight loss surgery. Her last A1c was 6.1 on 08/22/17. She admits to polyphagia.  At risk for diabetes Lisa Stanton is at higher than average risk for developing diabetes due to her obesity. She currently denies polyuria or polydipsia.  Depression Screen Lisa Stanton's Food and Mood (modified PHQ-9) score was 13. Depression screen Lisa Stanton 2/9 06/10/2018  Decreased Interest 2  Down, Depressed, Hopeless 2  PHQ - 2 Score 4  Altered sleeping 3  Tired, decreased energy 2  Change in appetite 2  Feeling bad or failure about yourself  0  Trouble concentrating 1  Moving slowly or fidgety/restless 1  Suicidal thoughts 0  PHQ-9 Score  13  Difficult doing work/chores Somewhat difficult    ASSESSMENT AND PLAN:  Other fatigue - Plan: EKG 12-Lead, Vitamin B12, CBC With Differential, T3, T4, free, TSH, Comprehensive metabolic panel, Anemia panel, Prealbumin  Shortness of breath on exertion - Plan: Lipid Panel With LDL/HDL Ratio  Other hyperlipidemia - Plan: Comprehensive metabolic panel  Vitamin D deficiency - Plan: VITAMIN D 25 Hydroxy (Vit-D Deficiency, Fractures)  B12 nutritional deficiency - Plan: Folate  Hyperglycemia - Plan: Hemoglobin A1c, Insulin, random  Depression screening  At risk for diabetes mellitus  Class 2 severe obesity with serious comorbidity and body mass index (BMI) of 39.0 to 39.9 in adult, unspecified obesity type (HCC)  PLAN:  Fatigue Lisa Stanton was informed that her fatigue may be related to obesity, depression or many other causes. Labs will be ordered, and in the meanwhile Lisa Stanton has agreed to work on diet, exercise and weight loss to help with fatigue. Proper sleep hygiene was discussed including the need for 7-8 hours of quality sleep each night. A sleep study was ordered based on symptoms and Epworth score. An EKG and an indirect calorimetry was ordered today and Lisa Stanton agrees to follow up in 2 weeks.  Dyspnea on exertion Lisa Stanton's shortness of breath appears to be obesity related and exercise induced. She has agreed to work on weight loss and gradually increase exercise to treat her exercise induced shortness of breath. If Lisa Stanton follows our instructions and loses weight without improvement of her shortness of breath, we will plan to refer to pulmonology. We will monitor this condition regularly. We ordered labs, an indirect calorimetry, and an EKG today. Lisa Stanton agrees to this plan.  Hyperlipidemia Lisa Stanton was informed of the American Heart Association Guidelines emphasizing intensive lifestyle modifications as the first line treatment for hyperlipidemia. We discussed many  lifestyle modifications today in depth, and Lisa Stanton will continue to work on decreasing saturated fats such as fatty red meat, butter and many fried foods. She will also increase vegetables and lean protein in her diet and continue to work on exercise and weight loss efforts. We will obtain labs today. She agrees to start her diet and follow up as directed.  Vitamin D Deficiency Lisa Stanton was informed that low vitamin D levels contributes to fatigue and are associated with obesity, breast, and colon cancer. We ordered a vitamin D level today and Lisa Stanton agrees to follow up as directed.  Vitamin B12 deficiency Nur will work on increasing B12 rich foods in her diet. B12 supplementation was not prescribed today. We ordered labs today and she agrees to follow up at the agreed upon time.  Hyperglycemia Fasting labs will be obtained and results with be discussed with Ellsie in 2 weeks at her follow up visit. In the meanwhile Esli agreed to start on a lower simple carbohydrate diet and will work on weight loss efforts.  Diabetes risk counseling Ludmila was given extended (30 minutes) diabetes prevention counseling today. She is 53 y.o. female and has risk factors for diabetes including obesity. We discussed intensive lifestyle modifications today with an emphasis on weight loss as well as increasing exercise and decreasing simple carbohydrates in her diet.  Depression Screen Bulah had a moderately positive depression screening.  Depression is commonly associated with obesity and often results in emotional eating behaviors. We will monitor this closely and work on CBT to help improve the non-hunger eating patterns. Referral to Psychology may be required if no improvement is seen as she continues in our clinic.  Obesity Sotiria is currently in the action stage of change and her goal is to continue with weight loss efforts. I recommend Aileana begin the structured treatment plan as  follows:  She has agreed to follow the Category 3 plan. Ardyn has been instructed to eventually work up to a goal of 150 minutes of combined cardio and strengthening exercise per week for weight loss and overall health benefits. We discussed the following Behavioral Modification Strategies today: increasing lean protein intake, decreasing simple carbohydrates, and work on meal planning and easy cooking plans.   She was informed of the importance of frequent follow up visits to maximize her success with intensive lifestyle modifications for her multiple health conditions. She was informed we would discuss her lab results at her next visit unless there is a critical issue that needs to be addressed sooner. Lisa Stanton agreed to keep her next visit at the agreed upon time to discuss these results.  ALLERGIES: Allergies  Allergen Reactions  . Benicar Hct [Olmesartan Medoxomil-Hctz] Shortness Of Breath  . Lisinopril     cough  . Singulair [Montelukast Sodium]     cough    MEDICATIONS: Current Outpatient Medications on File Prior to Visit  Medication Sig Dispense Refill  . Azilsartan-Chlorthalidone (EDARBYCLOR) 40-12.5 MG TABS Take 1 tablet by mouth daily. 90 tablet 3  . BELSOMRA 10 MG TABS TAKE 1 TABLET BY MOUTH DAILY. 30 tablet 1  . cetirizine (ZYRTEC) 10 MG tablet Take 10 mg by mouth daily.    . cholecalciferol (VITAMIN D) 1000 UNITS tablet Take 1,000 Units by mouth daily.    . Cyanocobalamin (VITAMIN B-12) 2500 MCG SUBL Place 1 tablet under the tongue daily.    . fluticasone (FLONASE) 50 MCG/ACT nasal spray Place 2 sprays into both nostrils daily as needed for allergies or rhinitis.    [name] ibuprofen (ADVIL,MOTRIN) 800 MG tablet Take 800 mg by mouth every 8 (eight) hours as needed.    . Multiple Vitamin (MULTIVITAMIN) tablet Take 1 tablet by mouth daily.      [name] omeprazole (PRILOSEC) 20 MG capsule Take 20 mg by mouth daily.    . pravastatin (PRAVACHOL) 80 MG tablet Take 1 tablet (80 mg  total) by mouth daily. 90 tablet 3  . Sennosides 8.6 MG CAPS Take 1 capsule by mouth daily.     No current facility-administered medications on file prior to visit.     PAST MEDICAL HISTORY: Past Medical History:  Diagnosis Date  . Anemia   . Arthritis   . Back pain   . Constipation   . Dyspnea   . Fatty liver   . Gallbladder problem   . GERD (gastroesophageal reflux disease)   . Hip pain   . Hyperlipidemia   . Hypertension   . Iron deficiency anemia   . Joint pain   . Knee pain   . Lower leg edema   . Murmur   . Thyroid nodule   . Vitamin B 12 deficiency     PAST SURGICAL HISTORY: Past Surgical History:  Procedure Laterality Date  . ABDOMINAL HERNIA REPAIR  09/2010   incarcerated that required surgery.    . ABDOMINAL HYSTERECTOMY    . ABDOMINOPLASTY  2006  . APPENDECTOMY  February 2004  . CARPAL TUNNEL RELEASE  May 1998   Right hand  . CHOLECYSTECTOMY  February 2003  . Cysts removed  September 1993   Left hand  . HERNIA REPAIR    . KNEE SURGERY  July 1986   Left knee  . PARTIAL HYSTERECTOMY  November 2010  . ROUX-EN-Y GASTRIC BYPASS  06/2000  . UMBILICAL HERNIA REPAIR      SOCIAL HISTORY: Social History   Tobacco Use  . Smoking status: Never Smoker  . Smokeless tobacco: Never Used  Substance Use Topics  . Alcohol use: Yes    Comment: 6 times/year  . Drug use: No    FAMILY HISTORY: Family History  Problem Relation Age of Onset  . Osteoporosis Mother   . Rheum arthritis Mother   . Kidney disease Mother   . Thyroid disease Mother   . Obesity Mother   . Breast cancer Maternal Grandmother   . Colon cancer Maternal Grandfather   . Thyroid cancer Brother 91  . Cancer Brother        thyroid  . Diabetes Father   . Hypertension Father   . Hyperlipidemia Father   . Cancer Father        pancreatic  . Obesity Father   . Diabetes Paternal Grandfather   . Heart disease Paternal Grandfather   . Cancer Sister        thyroid   ROS: Review of Systems   Constitutional: Positive for malaise/fatigue. Negative for weight loss.  HENT:       Positive for discharge. Positive for hay fever. Positive for dry mouth.  Eyes:       Wears glasses or contacts.  Respiratory: Positive for cough. Shortness of breath:  with activity.   Cardiovascular: Negative for chest pain and orthopnea.  Gastrointestinal: Positive for constipation and heartburn.  Genitourinary:       Negative for polyuria.  Musculoskeletal: Positive for joint pain and myalgias.  Endo/Heme/Allergies: Negative for polydipsia.       Positive for polyphagia.  Psychiatric/Behavioral: The patient has insomnia.    PHYSICAL EXAM: Blood pressure 115/75, pulse 70, temperature (!) 97.5 F (36.4 C), temperature source Oral, height 5\' 3"  (1.6 m), weight 221 lb (100.2 kg), last menstrual period 03/15/2009, SpO2 98 %. Body mass index is 39.15 kg/m. Physical Exam Vitals signs reviewed.  Constitutional:      Appearance: Normal appearance. She is obese.  HENT:     Head: Normocephalic and atraumatic.     Nose: Nose normal.  Eyes:     General: No scleral icterus.    Extraocular Movements: Extraocular movements intact.  Neck:     Musculoskeletal: Normal range of motion and neck supple.     Thyroid: No thyromegaly.     Comments: Negative for thyromegaly. Cardiovascular:     Rate and Rhythm: Normal rate and regular rhythm.  Pulmonary:     Effort: Pulmonary effort is normal. No respiratory distress.  Abdominal:     Palpations: Abdomen is soft.     Tenderness: There is no abdominal tenderness.     Comments: Positive for obesity.  Musculoskeletal:     Comments: ROM normal in all extremities.  Skin:    General: Skin is warm and dry.  Neurological:     Mental Status: She is alert and oriented to person, place, and time.     Coordination: Coordination normal.  Psychiatric:        Mood and Affect: Mood normal.  Behavior: Behavior normal.    RECENT LABS AND TESTS: BMET      Component Value Date/Time   NA 141 02/05/2018 0913   NA 146 01/26/2015   K 4.0 02/05/2018 0913   CL 100 02/05/2018 0913   CL 97 06/26/2014   CO2 32 02/05/2018 0913   GLUCOSE 100 (H) 02/05/2018 0913   BUN 12 02/05/2018 0913   BUN 14 01/26/2015   CREATININE 0.65 02/05/2018 0913   CALCIUM 9.7 02/05/2018 0913   CALCIUM 10.4 01/26/2015   CALCIUM 9.1 06/26/2014   GFRNONAA 103 02/05/2018 0913   GFRAA 119 02/05/2018 0913   Lab Results  Component Value Date   HGBA1C 6.1 (H) 08/22/2017   No results found for: INSULIN CBC    Component Value Date/Time   WBC 9.3 02/12/2017 0747   RBC 4.19 02/12/2017 0747   HGB 12.5 02/12/2017 0747   HCT 37.0 02/12/2017 0747   PLT 355 02/12/2017 0747   MCV 88.3 02/12/2017 0747   MCH 29.8 02/12/2017 0747   MCHC 33.8 02/12/2017 0747   RDW 13.6 02/12/2017 0747   LYMPHSABS 3,045 01/25/2016 0919   MONOABS 522 01/25/2016 0919   EOSABS 0 (L) 01/25/2016 0919   BASOSABS 87 01/25/2016 0919   Iron/TIBC/Ferritin/ %Sat    Component Value Date/Time   IRON 55 09/07/2009 1440   Lipid Panel     Component Value Date/Time   CHOL 172 02/05/2018 0913   TRIG 156 (H) 02/05/2018 0913   HDL 43 (L) 02/05/2018 0913   CHOLHDL 4.0 02/05/2018 0913   VLDL 28 01/25/2016 0919   LDLCALC 103 (H) 02/05/2018 0913   Hepatic Function Panel     Component Value Date/Time   PROT 6.9 02/05/2018 0913   ALBUMIN 3.9 01/25/2016 0919   AST 18 02/05/2018 0913   ALT 17 02/05/2018 0913   ALKPHOS 67 01/25/2016 0919   BILITOT 0.3 02/05/2018 0913   BILIDIR 0.1 01/28/2014 0914   IBILI 0.3 01/28/2014 0914      Component Value Date/Time   TSH 1.78 02/05/2018 0913   TSH 2.29 02/12/2017 0747   TSH 1.54 01/25/2016 0919   ECG  shows NSR with a rate of 65 BPM. INDIRECT CALORIMETER done today shows a VO2 of 308 and a REE of 2141.  Her calculated basal metabolic rate is 9528 thus her basal metabolic rate is better than expected.   OBESITY BEHAVIORAL INTERVENTION VISIT  Today's  visit was # 1   Starting weight: 221 lbs Starting date: 06/10/2018 Today's weight : Weight: 221 lb (100.2 kg)  Today's date: 06/10/2018 Total lbs lost to date: 0  ASK: We discussed the diagnosis of obesity with Jake Church today and Vernelle agreed to give [name] permission to discuss obesity behavioral modification therapy today.  ASSESS: Sharell has the diagnosis of obesity and her BMI today is 39.1. Shevawn is in the action stage of change.  ADVISE: Lailee was educated on the multiple health risks of obesity as well as the benefit of weight loss to improve her health. She was advised of the need for long term treatment and the importance of lifestyle modifications to improve her current health and to decrease her risk of future health problems.  AGREE: Multiple dietary modification options and treatment options were discussed and Adeola agreed to follow the recommendations documented in the above note.  ARRANGE: Skyley was educated on the importance of frequent visits to treat obesity as outlined per CMS and USPSTF guidelines and agreed to schedule her next follow  up appointment today.  I, Marcille Blanco, am acting as transcriptionist for Starlyn Skeans, MD  I have reviewed the above documentation for accuracy and completeness, and I agree with the above. -Dennard Nip, MD

## 2018-06-24 ENCOUNTER — Ambulatory Visit (INDEPENDENT_AMBULATORY_CARE_PROVIDER_SITE_OTHER): Payer: BLUE CROSS/BLUE SHIELD | Admitting: Family Medicine

## 2018-06-24 ENCOUNTER — Encounter (INDEPENDENT_AMBULATORY_CARE_PROVIDER_SITE_OTHER): Payer: Self-pay | Admitting: Family Medicine

## 2018-06-24 VITALS — BP 104/67 | HR 66 | Ht 63.0 in | Wt 214.0 lb

## 2018-06-24 DIAGNOSIS — R748 Abnormal levels of other serum enzymes: Secondary | ICD-10-CM | POA: Diagnosis not present

## 2018-06-24 DIAGNOSIS — Z9189 Other specified personal risk factors, not elsewhere classified: Secondary | ICD-10-CM

## 2018-06-24 DIAGNOSIS — E559 Vitamin D deficiency, unspecified: Secondary | ICD-10-CM

## 2018-06-24 DIAGNOSIS — R7303 Prediabetes: Secondary | ICD-10-CM | POA: Diagnosis not present

## 2018-06-24 DIAGNOSIS — R7989 Other specified abnormal findings of blood chemistry: Secondary | ICD-10-CM

## 2018-06-24 DIAGNOSIS — Z6838 Body mass index (BMI) 38.0-38.9, adult: Secondary | ICD-10-CM

## 2018-06-24 MED ORDER — VITAMIN D (ERGOCALCIFEROL) 1.25 MG (50000 UNIT) PO CAPS: 50000.0000 [IU] | ORAL_CAPSULE | ORAL | 0 refills | Status: DC

## 2018-06-24 NOTE — Progress Notes (Signed)
Office: [phone]  /  Fax: [phone]   HPI:   Chief Complaint: OBESITY Lisa Stanton is here to discuss her progress with her obesity treatment plan. She is on the Category 3 plan and is following her eating plan approximately 100 % of the time. She states she is walking the dogs for 15 minutes 7 times per week. Lisa Stanton has done well with weight loss on the Category 3 plan. Her hunger was controlled and she did well with meal planning and prepping.  Her weight is 214 lb (97.1 kg) today and has had a weight loss of 7 pounds over a period of 2 weeks since her last visit. She has lost 7 lbs since starting treatment with [name].  Vitamin D deficiency Lisa Stanton has a diagnosis of vitamin D deficiency. She is currently taking OTC vit D and is not at goal. She admits fatigue.  Elevated B12 Lisa Stanton is on OTC B12 to help increase her energy, but this has not helped and she is over-replaced of B12.  Pre-Diabetes Lisa Stanton has a diagnosis of pre-diabetes based on her elevated Hgb A1c and was informed this puts her at greater risk of developing diabetes. She did very well on her diet and weight loss. Her polyphagia has decreased on her eating plan. She is not taking metformin currently and continues to work on diet and exercise to decrease risk of diabetes. She denies hypoglycemia.  At risk for diabetes Lisa Stanton is at higher than average risk for developing diabetes due to her pre-diabetes and obesity. She currently denies polyuria or polydipsia.  ASSESSMENT AND PLAN:  Vitamin D deficiency - Plan: Vitamin D, Ergocalciferol, (DRISDOL) 1.25 MG (50000 UT) CAPS capsule  Elevated vitamin B12 level  Prediabetes  At risk for diabetes mellitus  Class 2 severe obesity with serious comorbidity and body mass index (BMI) of 38.0 to 38.9 in adult, unspecified obesity type ([place])  PLAN:  Vitamin D Deficiency Lisa Stanton was informed that low vitamin D levels contributes to fatigue and are associated  with obesity, breast, and colon cancer. She agrees to start to take prescription Vit D @50 ,000 IU every week #4 with no refills and will follow up for routine testing of vitamin D, at least 2-3 times per year. She was informed of the risk of over-replacement of vitamin D and agrees to not increase her dose unless she discusses this with [name] first. Lisa Stanton agrees to follow up in 2 weeks.  Elevated B12 [name] agrees to discontinue OTC B12 and we will recheck labs in 3 months. She will continue her B12 rich diet and follow up in 2 weeks. Lisa Stanton agrees with this plan.  Pre-Diabetes Lisa Stanton will continue to work on weight loss, exercise, and decreasing simple carbohydrates in her diet to help decrease the risk of diabetes. We discussed metformin including benefits and risks. She was informed that eating too many simple carbohydrates or too many calories at one sitting increases the likelihood of GI side effects. Lisa Stanton deferred metformin for now and agreed to continue her diet and weight loss. Lisa Stanton agreed to follow up with [name] as directed to monitor her progress.  Diabetes risk counseling Lisa Stanton was given extended (30 minutes) diabetes prevention counseling today. She is 53 y.o. female and has risk factors for diabetes including pre-diabetes and obesity. We discussed intensive lifestyle modifications today with an emphasis on weight loss as well as increasing exercise and decreasing simple carbohydrates in her diet.  Obesity Lisa Stanton is currently in the action stage of change. As such,  her goal is to continue with weight loss efforts. She has agreed to follow the Category 3 plan. Lisa Stanton has been instructed to work up to a goal of 150 minutes of combined cardio and strengthening exercise per week for weight loss and overall health benefits. We discussed the following Behavioral Modification Strategies today: increasing lean protein intake, decreasing simple carbohydrates, and work on  meal planning and easy cooking plans.  Lisa Stanton has agreed to follow up with our clinic in 2 weeks. She was informed of the importance of frequent follow up visits to maximize her success with intensive lifestyle modifications for her multiple health conditions.  ALLERGIES: Allergies  Allergen Reactions  . Benicar Hct [Olmesartan Medoxomil-Hctz] Shortness Of Breath  . Lisinopril     cough  . Singulair [Montelukast Sodium]     cough    MEDICATIONS: Current Outpatient Medications on File Prior to Visit  Medication Sig Dispense Refill  . Azilsartan-Chlorthalidone (EDARBYCLOR) 40-12.5 MG TABS Take 1 tablet by mouth daily. 90 tablet 3  . BELSOMRA 10 MG TABS TAKE 1 TABLET BY MOUTH DAILY. 30 tablet 1  . cetirizine (ZYRTEC) 10 MG tablet Take 10 mg by mouth daily.    . cholecalciferol (VITAMIN D) 1000 UNITS tablet Take 1,000 Units by mouth daily.    . Cyanocobalamin (VITAMIN B-12) 2500 MCG SUBL Place 1 tablet under the tongue daily.    . fluticasone (FLONASE) 50 MCG/ACT nasal spray Place 2 sprays into both nostrils daily as needed for allergies or rhinitis.    [name] ibuprofen (ADVIL,MOTRIN) 800 MG tablet Take 800 mg by mouth every 8 (eight) hours as needed.    . Multiple Vitamin (MULTIVITAMIN) tablet Take 1 tablet by mouth daily.      [name] omeprazole (PRILOSEC) 20 MG capsule Take 20 mg by mouth daily.    . pravastatin (PRAVACHOL) 80 MG tablet Take 1 tablet (80 mg total) by mouth daily. 90 tablet 3  . Sennosides 8.6 MG CAPS Take 1 capsule by mouth daily.     No current facility-administered medications on file prior to visit.     PAST MEDICAL HISTORY: Past Medical History:  Diagnosis Date  . Anemia   . Arthritis   . Back pain   . Constipation   . Dyspnea   . Fatty liver   . Gallbladder problem   . GERD (gastroesophageal reflux disease)   . Hip pain   . Hyperlipidemia   . Hypertension   . Iron deficiency anemia   . Joint pain   . Knee pain   . Lower leg edema   . Murmur   . Thyroid  nodule   . Vitamin B 12 deficiency     PAST SURGICAL HISTORY: Past Surgical History:  Procedure Laterality Date  . ABDOMINAL HERNIA REPAIR  09/2010   incarcerated that required surgery.    . ABDOMINAL HYSTERECTOMY    . ABDOMINOPLASTY  2006  . APPENDECTOMY  February 2004  . CARPAL TUNNEL RELEASE  May 1998   Right hand  . CHOLECYSTECTOMY  February 2003  . Cysts removed  September 1993   Left hand  . HERNIA REPAIR    . KNEE SURGERY  July 1986   Left knee  . PARTIAL HYSTERECTOMY  November 2010  . ROUX-EN-Y GASTRIC BYPASS  06/2000  . UMBILICAL HERNIA REPAIR      SOCIAL HISTORY: Social History   Tobacco Use  . Smoking status: Never Smoker  . Smokeless tobacco: Never Used  Substance Use Topics  . Alcohol  use: Yes    Comment: 6 times/year  . Drug use: No    FAMILY HISTORY: Family History  Problem Relation Age of Onset  . Osteoporosis Mother   . Rheum arthritis Mother   . Kidney disease Mother   . Thyroid disease Mother   . Obesity Mother   . Breast cancer Maternal Grandmother   . Colon cancer Maternal Grandfather   . Thyroid cancer Brother 85  . Cancer Brother        thyroid  . Diabetes Father   . Hypertension Father   . Hyperlipidemia Father   . Cancer Father        pancreatic  . Obesity Father   . Diabetes Paternal Grandfather   . Heart disease Paternal Grandfather   . Cancer Sister        thyroid    ROS: Review of Systems  Constitutional: Positive for malaise/fatigue and weight loss.  Genitourinary:       Negative for polyuria.  Endo/Heme/Allergies: Negative for polydipsia.       Negative for hypoglycemia. Positive for polyphagia.    PHYSICAL EXAM: Blood pressure 104/67, pulse 66, height 5\' 3"  (1.6 m), weight 214 lb (97.1 kg), last menstrual period 03/15/2009, SpO2 100 %. Body mass index is 37.91 kg/m. Physical Exam Vitals signs reviewed.  Constitutional:      Appearance: Normal appearance. She is obese.  Cardiovascular:     Rate and Rhythm:  Normal rate.  Pulmonary:     Effort: Pulmonary effort is normal.  Musculoskeletal: Normal range of motion.  Skin:    General: Skin is warm and dry.  Neurological:     Mental Status: She is alert and oriented to person, place, and time.  Psychiatric:        Mood and Affect: Mood normal.        Behavior: Behavior normal.     RECENT LABS AND TESTS: BMET    Component Value Date/Time   NA 139 06/10/2018 0956   K 3.9 06/10/2018 0956   CL 98 06/10/2018 0956   CL 97 06/26/2014   CO2 26 06/10/2018 0956   GLUCOSE 83 06/10/2018 0956   GLUCOSE 100 (H) 02/05/2018 0913   BUN 10 06/10/2018 0956   CREATININE 0.61 06/10/2018 0956   CREATININE 0.65 02/05/2018 0913   CALCIUM 9.4 06/10/2018 0956   CALCIUM 10.4 01/26/2015   CALCIUM 9.1 06/26/2014   GFRNONAA 105 06/10/2018 0956   GFRNONAA 103 02/05/2018 0913   GFRAA 121 06/10/2018 0956   GFRAA 119 02/05/2018 0913   Lab Results  Component Value Date   HGBA1C 5.8 (H) 06/10/2018   HGBA1C 6.1 (H) 08/22/2017   HGBA1C 5.9 (H) 02/12/2017   HGBA1C 6.1 (H) 01/25/2016   HGBA1C 6.4 02/23/2015   Lab Results  Component Value Date   INSULIN 24.1 06/10/2018   CBC    Component Value Date/Time   WBC 9.4 06/10/2018 0956   WBC 9.3 02/12/2017 0747   RBC 4.25 06/10/2018 0956   RBC 4.19 02/12/2017 0747   HGB 12.5 06/10/2018 0956   HCT 37.7 06/10/2018 0956   PLT 355 02/12/2017 0747   MCV 89 06/10/2018 0956   MCH 29.4 06/10/2018 0956   MCH 29.8 02/12/2017 0747   MCHC 33.2 06/10/2018 0956   MCHC 33.8 02/12/2017 0747   RDW 14.5 06/10/2018 0956   LYMPHSABS 3.4 (H) 06/10/2018 0956   MONOABS 522 01/25/2016 0919   EOSABS 0.1 06/10/2018 0956   BASOSABS 0.1 06/10/2018 0956   Iron/TIBC/Ferritin/ %Sat  Component Value Date/Time   IRON 96 06/10/2018 0956   TIBC 353 06/10/2018 0956   FERRITIN 62 06/10/2018 0956   IRONPCTSAT 27 06/10/2018 0956   Lipid Panel     Component Value Date/Time   CHOL 189 06/10/2018 0956   TRIG 139 06/10/2018 0956    HDL 53 06/10/2018 0956   CHOLHDL 4.0 02/05/2018 0913   VLDL 28 01/25/2016 0919   LDLCALC 108 (H) 06/10/2018 0956   LDLCALC 103 (H) 02/05/2018 0913   Hepatic Function Panel     Component Value Date/Time   PROT 6.8 06/10/2018 0956   ALBUMIN 4.3 06/10/2018 0956   AST 20 06/10/2018 0956   ALT 23 06/10/2018 0956   ALKPHOS 96 06/10/2018 0956   BILITOT 0.4 06/10/2018 0956   BILIDIR 0.1 01/28/2014 0914   IBILI 0.3 01/28/2014 0914      Component Value Date/Time   TSH 2.110 06/10/2018 0956   TSH 1.78 02/05/2018 0913   TSH 2.29 02/12/2017 0747   Results for Rosalia Hammers, Jnae "WENDI" (MRN [id number]) as of 06/24/2018 16:23  Ref. Range 06/10/2018 09:56  Vitamin D, 25-Hydroxy Latest Ref Range: 30.0 - 100.0 ng/mL 30.8    OBESITY BEHAVIORAL INTERVENTION VISIT  Today's visit was # 2   Starting weight: 221 lbs Starting date: 06/10/2018 Today's weight : Weight: 214 lb (97.1 kg)  Today's date: 06/24/2018 Total lbs lost to date: 7  ASK: We discussed the diagnosis of obesity with Jake Church today and Gabi agreed to give [name] permission to discuss obesity behavioral modification therapy today.  ASSESS: Graziella has the diagnosis of obesity and her BMI today is 37.9. Lyrick is in the action stage of change.   ADVISE: Lisa Stanton was educated on the multiple health risks of obesity as well as the benefit of weight loss to improve her health. She was advised of the need for long term treatment and the importance of lifestyle modifications to improve her current health and to decrease her risk of future health problems.  AGREE: Multiple dietary modification options and treatment options were discussed and Lisa Stanton agreed to follow the recommendations documented in the above note.  ARRANGE: Lisa Stanton was educated on the importance of frequent visits to treat obesity as outlined per CMS and USPSTF guidelines and agreed to schedule her next follow up appointment today.  IMarcille Blanco,  CMA, am acting as transcriptionist for Starlyn Skeans, MD  I have reviewed the above documentation for accuracy and completeness, and I agree with the above. -Dennard Nip, MD

## 2018-07-08 ENCOUNTER — Ambulatory Visit (INDEPENDENT_AMBULATORY_CARE_PROVIDER_SITE_OTHER): Payer: BLUE CROSS/BLUE SHIELD | Admitting: Family Medicine

## 2018-07-08 ENCOUNTER — Encounter (INDEPENDENT_AMBULATORY_CARE_PROVIDER_SITE_OTHER): Payer: Self-pay | Admitting: Family Medicine

## 2018-07-08 VITALS — BP 103/67 | HR 69 | Temp 98.8°F | Ht 63.0 in | Wt 210.0 lb

## 2018-07-08 DIAGNOSIS — R7303 Prediabetes: Secondary | ICD-10-CM

## 2018-07-08 DIAGNOSIS — Z6837 Body mass index (BMI) 37.0-37.9, adult: Secondary | ICD-10-CM

## 2018-07-09 ENCOUNTER — Encounter (INDEPENDENT_AMBULATORY_CARE_PROVIDER_SITE_OTHER): Payer: Self-pay | Admitting: Family Medicine

## 2018-07-09 NOTE — Progress Notes (Signed)
Office: [phone]  /  Fax: [phone]   HPI:   Chief Complaint: OBESITY Lisa Stanton is here to discuss her progress with her obesity treatment plan. She is on the Category 3 plan and is following her eating plan approximately 90% of the time. She states she is exercising 0 minutes 0 times per week. Lisa Stanton did well on the plan but did indulge on Valentine's Day. She struggles with snacking after dinner when not hungry.  She will be going to [place] in 1 week. Her weight is 210 lb (95.3 kg) today and has had a weight loss of 4 pounds over a period of 2 weeks since her last visit. She has lost 11 lbs since starting treatment with [name].  Pre-Diabetes Lisa Stanton has a diagnosis of prediabetes based on her elevated Hgb A1c and was informed this puts her at greater risk of developing diabetes. Her last A1C was reported at 5.8 on 06/10/2018. She is not taking metformin currently and continues to work on diet and exercise to decrease risk of diabetes. She denies polyphagia.  ASSESSMENT AND PLAN:  Prediabetes  Class 2 severe obesity with serious comorbidity and body mass index (BMI) of 37.0 to 37.9 in adult, unspecified obesity type (Lisa Stanton)  PLAN:  Pre-Diabetes Lisa Stanton will continue to work on weight loss, exercise, and decreasing simple carbohydrates in her diet to help decrease the risk of diabetes.  Lisa Stanton is not on metformin and a prescription was not written today. Lisa Stanton agreed to follow-up with [name] as directed to monitor her progress.  I spent > than 50% of the 15 minute visit on counseling as documented in the note.  Obesity Lisa Stanton is currently in the action stage of change. As such, her goal is to continue with weight loss efforts. She has agreed to follow the Category 3 plan. We discussed exchanges for 2 oz. protein. We discussed the following Behavioral Modification Strategies today: better snacking choices, travel eating strategies, and planning for success. Lisa Stanton has  not been prescribed exercise at this time.  Lisa Stanton has agreed to follow-up with our clinic in 2 weeks. She was informed of the importance of frequent follow up visits to maximize her success with intensive lifestyle modifications for her multiple health conditions.  ALLERGIES: Allergies  Allergen Reactions  . Benicar Hct [Olmesartan Medoxomil-Hctz] Shortness Of Breath  . Lisinopril     cough  . Singulair [Montelukast Sodium]     cough    MEDICATIONS: Current Outpatient Medications on File Prior to Visit  Medication Sig Dispense Refill  . Azilsartan-Chlorthalidone (EDARBYCLOR) 40-12.5 MG TABS Take 1 tablet by mouth daily. 90 tablet 3  . BELSOMRA 10 MG TABS TAKE 1 TABLET BY MOUTH DAILY. 30 tablet 1  . cetirizine (ZYRTEC) 10 MG tablet Take 10 mg by mouth daily.    . cholecalciferol (VITAMIN D) 1000 UNITS tablet Take 1,000 Units by mouth daily.    . fluticasone (FLONASE) 50 MCG/ACT nasal spray Place 2 sprays into both nostrils daily as needed for allergies or rhinitis.    [name] ibuprofen (ADVIL,MOTRIN) 800 MG tablet Take 800 mg by mouth every 8 (eight) hours as needed.    . Multiple Vitamin (MULTIVITAMIN) tablet Take 1 tablet by mouth daily.      [name] omeprazole (PRILOSEC) 20 MG capsule Take 20 mg by mouth daily.    . pravastatin (PRAVACHOL) 80 MG tablet Take 1 tablet (80 mg total) by mouth daily. 90 tablet 3  . Sennosides 8.6 MG CAPS Take 1 capsule by mouth daily.    [name]  Vitamin D, Ergocalciferol, (DRISDOL) 1.25 MG (50000 UT) CAPS capsule Take 1 capsule (50,000 Units total) by mouth every 7 (seven) days. 4 capsule 0   No current facility-administered medications on file prior to visit.     PAST MEDICAL HISTORY: Past Medical History:  Diagnosis Date  . Anemia   . Arthritis   . Back pain   . Constipation   . Dyspnea   . Fatty liver   . Gallbladder problem   . GERD (gastroesophageal reflux disease)   . Hip pain   . Hyperlipidemia   . Hypertension   . Iron deficiency anemia   .  Joint pain   . Knee pain   . Lower leg edema   . Murmur   . Thyroid nodule   . Vitamin B 12 deficiency     PAST SURGICAL HISTORY: Past Surgical History:  Procedure Laterality Date  . ABDOMINAL HERNIA REPAIR  09/2010   incarcerated that required surgery.    . ABDOMINAL HYSTERECTOMY    . ABDOMINOPLASTY  2006  . APPENDECTOMY  February 2004  . CARPAL TUNNEL RELEASE  May 1998   Right hand  . CHOLECYSTECTOMY  February 2003  . Cysts removed  September 1993   Left hand  . HERNIA REPAIR    . KNEE SURGERY  July 1986   Left knee  . PARTIAL HYSTERECTOMY  November 2010  . ROUX-EN-Y GASTRIC BYPASS  06/2000  . UMBILICAL HERNIA REPAIR      SOCIAL HISTORY: Social History   Tobacco Use  . Smoking status: Never Smoker  . Smokeless tobacco: Never Used  Substance Use Topics  . Alcohol use: Yes    Comment: 6 times/year  . Drug use: No    FAMILY HISTORY: Family History  Problem Relation Age of Onset  . Osteoporosis Mother   . Rheum arthritis Mother   . Kidney disease Mother   . Thyroid disease Mother   . Obesity Mother   . Breast cancer Maternal Grandmother   . Colon cancer Maternal Grandfather   . Thyroid cancer Brother 63  . Cancer Brother        thyroid  . Diabetes Father   . Hypertension Father   . Hyperlipidemia Father   . Cancer Father        pancreatic  . Obesity Father   . Diabetes Paternal Grandfather   . Heart disease Paternal Grandfather   . Cancer Sister        thyroid   ROS: Review of Systems  Constitutional: Positive for weight loss.  Endo/Heme/Allergies:       Negative for polyphagia.   PHYSICAL EXAM: Blood pressure 103/67, pulse 69, temperature 98.8 F (37.1 C), temperature source Oral, height 5\' 3"  (1.6 m), weight 210 lb (95.3 kg), last menstrual period 03/15/2009, SpO2 97 %. Body mass index is 37.2 kg/m. Physical Exam Vitals signs reviewed.  Constitutional:      Appearance: Normal appearance. She is obese.  Cardiovascular:     Rate and Rhythm:  Normal rate.     Pulses: Normal pulses.  Pulmonary:     Effort: Pulmonary effort is normal.     Breath sounds: Normal breath sounds.  Musculoskeletal: Normal range of motion.  Skin:    General: Skin is warm and dry.  Neurological:     Mental Status: She is alert and oriented to person, place, and time.  Psychiatric:        Behavior: Behavior normal.   RECENT LABS AND TESTS: BMET  Component Value Date/Time   NA 139 06/10/2018 0956   K 3.9 06/10/2018 0956   CL 98 06/10/2018 0956   CL 97 06/26/2014   CO2 26 06/10/2018 0956   GLUCOSE 83 06/10/2018 0956   GLUCOSE 100 (H) 02/05/2018 0913   BUN 10 06/10/2018 0956   CREATININE 0.61 06/10/2018 0956   CREATININE 0.65 02/05/2018 0913   CALCIUM 9.4 06/10/2018 0956   CALCIUM 10.4 01/26/2015   CALCIUM 9.1 06/26/2014   GFRNONAA 105 06/10/2018 0956   GFRNONAA 103 02/05/2018 0913   GFRAA 121 06/10/2018 0956   GFRAA 119 02/05/2018 0913   Lab Results  Component Value Date   HGBA1C 5.8 (H) 06/10/2018   HGBA1C 6.1 (H) 08/22/2017   HGBA1C 5.9 (H) 02/12/2017   HGBA1C 6.1 (H) 01/25/2016   HGBA1C 6.4 02/23/2015   Lab Results  Component Value Date   INSULIN 24.1 06/10/2018   CBC    Component Value Date/Time   WBC 9.4 06/10/2018 0956   WBC 9.3 02/12/2017 0747   RBC 4.25 06/10/2018 0956   RBC 4.19 02/12/2017 0747   HGB 12.5 06/10/2018 0956   HCT 37.7 06/10/2018 0956   PLT 355 02/12/2017 0747   MCV 89 06/10/2018 0956   MCH 29.4 06/10/2018 0956   MCH 29.8 02/12/2017 0747   MCHC 33.2 06/10/2018 0956   MCHC 33.8 02/12/2017 0747   RDW 14.5 06/10/2018 0956   LYMPHSABS 3.4 (H) 06/10/2018 0956   MONOABS 522 01/25/2016 0919   EOSABS 0.1 06/10/2018 0956   BASOSABS 0.1 06/10/2018 0956   Iron/TIBC/Ferritin/ %Sat    Component Value Date/Time   IRON 96 06/10/2018 0956   TIBC 353 06/10/2018 0956   FERRITIN 62 06/10/2018 0956   IRONPCTSAT 27 06/10/2018 0956   Lipid Panel     Component Value Date/Time   CHOL 189 06/10/2018 0956    TRIG 139 06/10/2018 0956   HDL 53 06/10/2018 0956   CHOLHDL 4.0 02/05/2018 0913   VLDL 28 01/25/2016 0919   LDLCALC 108 (H) 06/10/2018 0956   LDLCALC 103 (H) 02/05/2018 0913   Hepatic Function Panel     Component Value Date/Time   PROT 6.8 06/10/2018 0956   ALBUMIN 4.3 06/10/2018 0956   AST 20 06/10/2018 0956   ALT 23 06/10/2018 0956   ALKPHOS 96 06/10/2018 0956   BILITOT 0.4 06/10/2018 0956   BILIDIR 0.1 01/28/2014 0914   IBILI 0.3 01/28/2014 0914      Component Value Date/Time   TSH 2.110 06/10/2018 0956   TSH 1.78 02/05/2018 0913   TSH 2.29 02/12/2017 0747    Ref. Range 06/10/2018 09:56  Vitamin D, 25-Hydroxy Latest Ref Range: 30.0 - 100.0 ng/mL 30.8    OBESITY BEHAVIORAL INTERVENTION VISIT  Today's visit was #3  Starting weight: 221 lbs Starting date: 06/10/2018 Today's weight: 210 lbs  Today's date: 07/08/2018 Total lbs lost to date: 11   07/08/2018  Height 5\' 3"  (1.6 m)  Weight 210 lb (95.3 kg)  BMI (Calculated) 37.21  BLOOD PRESSURE - SYSTOLIC 294  BLOOD PRESSURE - DIASTOLIC 67   Body Fat % 76.5 %  Total Body Water (lbs) 85 lbs   ASK: We discussed the diagnosis of obesity with Jake Church today and Remas agreed to give [name] permission to discuss obesity behavioral modification therapy today.  ASSESS: Tatyanna has the diagnosis of obesity and her BMI today is 37.21. Maraya is in the action stage of change.   ADVISE: Mayeli was educated on the multiple health risks of  obesity as well as the benefit of weight loss to improve her health. She was advised of the need for long term treatment and the importance of lifestyle modifications to improve her current health and to decrease her risk of future health problems.  AGREE: Multiple dietary modification options and treatment options were discussed and  Aveah agreed to follow the recommendations documented in the above note.  ARRANGE: Julyssa was educated on the importance of frequent  visits to treat obesity as outlined per CMS and USPSTF guidelines and agreed to schedule her next follow up appointment today.  IMichaelene Song, am acting as Location manager for [employer], FNP-C.  I have reviewed the above documentation for accuracy and completeness, and I agree with the above.  - Tenesia Escudero, FNP-C.

## 2018-07-24 ENCOUNTER — Ambulatory Visit (INDEPENDENT_AMBULATORY_CARE_PROVIDER_SITE_OTHER): Payer: BLUE CROSS/BLUE SHIELD | Admitting: Physician Assistant

## 2018-07-24 ENCOUNTER — Other Ambulatory Visit: Payer: Self-pay

## 2018-07-24 VITALS — BP 106/70 | HR 58 | Temp 97.7°F | Ht 63.0 in | Wt 212.0 lb

## 2018-07-24 DIAGNOSIS — K5909 Other constipation: Secondary | ICD-10-CM

## 2018-07-24 DIAGNOSIS — Z9189 Other specified personal risk factors, not elsewhere classified: Secondary | ICD-10-CM | POA: Diagnosis not present

## 2018-07-24 DIAGNOSIS — E559 Vitamin D deficiency, unspecified: Secondary | ICD-10-CM | POA: Diagnosis not present

## 2018-07-24 DIAGNOSIS — Z6837 Body mass index (BMI) 37.0-37.9, adult: Secondary | ICD-10-CM

## 2018-07-24 MED ORDER — VITAMIN D (ERGOCALCIFEROL) 1.25 MG (50000 UNIT) PO CAPS: 50000.0000 [IU] | ORAL_CAPSULE | ORAL | 0 refills | Status: DC

## 2018-07-24 MED ORDER — DOCUSATE SODIUM 100 MG PO CAPS: 100.0000 mg | ORAL_CAPSULE | Freq: Two times a day (BID) | ORAL | 0 refills | Status: AC

## 2018-07-24 NOTE — Progress Notes (Signed)
Office: [phone]  /  Fax: [phone]   HPI:   Chief Complaint: OBESITY Lisa Stanton is here to discuss her progress with her obesity treatment plan. She is on the Category 3 plan and is following her eating plan approximately 60% of the time. She states she is exercising 0 minutes 0 times per week. Lisa Stanton reports that she has been having bad sweet cravings, especially in the evening, and has been using chocolate candy as her snack calories. She also reports constipation. Her weight is 212 lb (96.2 kg) today and has had a weight gain of 2 lbs since her last visit. She has lost 9 lbs since starting treatment with [name].  Vitamin D deficiency Lisa Stanton has a diagnosis of Vitamin D deficiency. She is currently taking prescription Vit D and denies nausea, vomiting or muscle weakness.  At risk for osteopenia and osteoporosis Lisa Stanton is at higher risk of osteopenia and osteoporosis due to Vitamin D deficiency.   Constipation Lisa Stanton states she has not had a BM in 4 days and report nausea with an episode of vomiting and abdominal discomfort. She reports no blood in her stools.  ASSESSMENT AND PLAN:  Vitamin D deficiency - Plan: Vitamin D, Ergocalciferol, (DRISDOL) 1.25 MG (50000 UT) CAPS capsule  Other constipation - Plan: docusate sodium (COLACE) 100 MG capsule  At risk for osteoporosis  Class 2 severe obesity with serious comorbidity and body mass index (BMI) of 37.0 to 37.9 in adult, unspecified obesity type ([place])  PLAN:  Vitamin D Deficiency Lisa Stanton was informed that low Vitamin D levels contributes to fatigue and are associated with obesity, breast, and colon cancer. She agrees to continue to take prescription Vit D @ 50,000 IU every week #4 with 0 refills and will follow-up for routine testing of Vitamin D, at least 2-3 times per year. She was informed of the risk of over-replacement of Vitamin D and agrees to not increase her dose unless she discusses this with [name] first.  [name] agrees to follow-up with our clinic in 2 weeks.  At risk for osteopenia and osteoporosis Lisa Stanton was given extended  (15 minutes) osteoporosis prevention counseling today. Lisa Stanton is at risk for osteopenia and osteoporsis due to her Vitamin D deficiency. She was encouraged to take her Vitamin D and follow her higher calcium diet and increase strengthening exercise to help strengthen her bones and decrease her risk of osteopenia and osteoporosis.  Constipation Lisa Stanton was advised to take Colace 100 mg 1 tab BID #60 with 0 refills.  Obesity Lisa Stanton is currently in the action stage of change. As such, her goal is to continue with weight loss efforts. She has agreed to follow the Category 3 plan. Lisa Stanton has been instructed to work up to a goal of 150 minutes of combined cardio and strengthening exercise per week for weight loss and overall health benefits. We discussed the following Behavioral Modification Strategies today: work on meal planning and easy cooking plans and better snacking choices.  Lisa Stanton has agreed to follow-up with our clinic in 2 weeks. She was informed of the importance of frequent follow-up visits to maximize her success with intensive lifestyle modifications for her multiple health conditions.  ALLERGIES: Allergies  Allergen Reactions  . Benicar Hct [Olmesartan Medoxomil-Hctz] Shortness Of Breath  . Lisinopril     cough  . Singulair [Montelukast Sodium]     cough    MEDICATIONS: Current Outpatient Medications on File Prior to Visit  Medication Sig Dispense Refill  . Azilsartan-Chlorthalidone (EDARBYCLOR) 40-12.5 MG TABS  Take 1 tablet by mouth daily. 90 tablet 3  . BELSOMRA 10 MG TABS TAKE 1 TABLET BY MOUTH DAILY. 30 tablet 1  . cetirizine (ZYRTEC) 10 MG tablet Take 10 mg by mouth daily.    . cholecalciferol (VITAMIN D) 1000 UNITS tablet Take 1,000 Units by mouth daily.    . fluticasone (FLONASE) 50 MCG/ACT nasal spray Place 2 sprays into  both nostrils daily as needed for allergies or rhinitis.    [name] ibuprofen (ADVIL,MOTRIN) 800 MG tablet Take 800 mg by mouth every 8 (eight) hours as needed.    . Multiple Vitamin (MULTIVITAMIN) tablet Take 1 tablet by mouth daily.      [name] omeprazole (PRILOSEC) 20 MG capsule Take 20 mg by mouth daily.    . pravastatin (PRAVACHOL) 80 MG tablet Take 1 tablet (80 mg total) by mouth daily. 90 tablet 3  . Sennosides 8.6 MG CAPS Take 1 capsule by mouth daily.     No current facility-administered medications on file prior to visit.     PAST MEDICAL HISTORY: Past Medical History:  Diagnosis Date  . Anemia   . Arthritis   . Back pain   . Constipation   . Dyspnea   . Fatty liver   . Gallbladder problem   . GERD (gastroesophageal reflux disease)   . Hip pain   . Hyperlipidemia   . Hypertension   . Iron deficiency anemia   . Joint pain   . Knee pain   . Lower leg edema   . Murmur   . Thyroid nodule   . Vitamin B 12 deficiency     PAST SURGICAL HISTORY: Past Surgical History:  Procedure Laterality Date  . ABDOMINAL HERNIA REPAIR  09/2010   incarcerated that required surgery.    . ABDOMINAL HYSTERECTOMY    . ABDOMINOPLASTY  2006  . APPENDECTOMY  February 2004  . CARPAL TUNNEL RELEASE  May 1998   Right hand  . CHOLECYSTECTOMY  February 2003  . Cysts removed  September 1993   Left hand  . HERNIA REPAIR    . KNEE SURGERY  July 1986   Left knee  . PARTIAL HYSTERECTOMY  November 2010  . ROUX-EN-Y GASTRIC BYPASS  06/2000  . UMBILICAL HERNIA REPAIR      SOCIAL HISTORY: Social History   Tobacco Use  . Smoking status: Never Smoker  . Smokeless tobacco: Never Used  Substance Use Topics  . Alcohol use: Yes    Comment: 6 times/year  . Drug use: No    FAMILY HISTORY: Family History  Problem Relation Age of Onset  . Osteoporosis Mother   . Rheum arthritis Mother   . Kidney disease Mother   . Thyroid disease Mother   . Obesity Mother   . Breast cancer Maternal Grandmother   .  Colon cancer Maternal Grandfather   . Thyroid cancer Brother 71  . Cancer Brother        thyroid  . Diabetes Father   . Hypertension Father   . Hyperlipidemia Father   . Cancer Father        pancreatic  . Obesity Father   . Diabetes Paternal Grandfather   . Heart disease Paternal Grandfather   . Cancer Sister        thyroid   ROS: Review of Systems  Constitutional: Negative for weight loss.  Gastrointestinal: Positive for constipation. Negative for nausea and vomiting.  Musculoskeletal:       Negative for muscle weakness.  Endo/Heme/Allergies:  Negative for hypoglycemia.   PHYSICAL EXAM: Blood pressure 106/70, pulse (!) 58, temperature 97.7 F (36.5 C), temperature source Oral, height 5\' 3"  (1.6 m), weight 212 lb (96.2 kg), last menstrual period 03/15/2009, SpO2 100 %. Body mass index is 37.55 kg/m. Physical Exam Vitals signs reviewed.  Constitutional:      Appearance: Normal appearance. She is obese.  Cardiovascular:     Rate and Rhythm: Normal rate.     Pulses: Normal pulses.  Pulmonary:     Effort: Pulmonary effort is normal.     Breath sounds: Normal breath sounds.  Musculoskeletal: Normal range of motion.  Skin:    General: Skin is warm and dry.  Neurological:     Mental Status: She is alert and oriented to person, place, and time.  Psychiatric:        Behavior: Behavior normal.   RECENT LABS AND TESTS: BMET    Component Value Date/Time   NA 139 06/10/2018 0956   K 3.9 06/10/2018 0956   CL 98 06/10/2018 0956   CL 97 06/26/2014   CO2 26 06/10/2018 0956   GLUCOSE 83 06/10/2018 0956   GLUCOSE 100 (H) 02/05/2018 0913   BUN 10 06/10/2018 0956   CREATININE 0.61 06/10/2018 0956   CREATININE 0.65 02/05/2018 0913   CALCIUM 9.4 06/10/2018 0956   CALCIUM 10.4 01/26/2015   CALCIUM 9.1 06/26/2014   GFRNONAA 105 06/10/2018 0956   GFRNONAA 103 02/05/2018 0913   GFRAA 121 06/10/2018 0956   GFRAA 119 02/05/2018 0913   Lab Results  Component Value Date    HGBA1C 5.8 (H) 06/10/2018   HGBA1C 6.1 (H) 08/22/2017   HGBA1C 5.9 (H) 02/12/2017   HGBA1C 6.1 (H) 01/25/2016   HGBA1C 6.4 02/23/2015   Lab Results  Component Value Date   INSULIN 24.1 06/10/2018   CBC    Component Value Date/Time   WBC 9.4 06/10/2018 0956   WBC 9.3 02/12/2017 0747   RBC 4.25 06/10/2018 0956   RBC 4.19 02/12/2017 0747   HGB 12.5 06/10/2018 0956   HCT 37.7 06/10/2018 0956   PLT 355 02/12/2017 0747   MCV 89 06/10/2018 0956   MCH 29.4 06/10/2018 0956   MCH 29.8 02/12/2017 0747   MCHC 33.2 06/10/2018 0956   MCHC 33.8 02/12/2017 0747   RDW 14.5 06/10/2018 0956   LYMPHSABS 3.4 (H) 06/10/2018 0956   MONOABS 522 01/25/2016 0919   EOSABS 0.1 06/10/2018 0956   BASOSABS 0.1 06/10/2018 0956   Iron/TIBC/Ferritin/ %Sat    Component Value Date/Time   IRON 96 06/10/2018 0956   TIBC 353 06/10/2018 0956   FERRITIN 62 06/10/2018 0956   IRONPCTSAT 27 06/10/2018 0956   Lipid Panel     Component Value Date/Time   CHOL 189 06/10/2018 0956   TRIG 139 06/10/2018 0956   HDL 53 06/10/2018 0956   CHOLHDL 4.0 02/05/2018 0913   VLDL 28 01/25/2016 0919   LDLCALC 108 (H) 06/10/2018 0956   LDLCALC 103 (H) 02/05/2018 0913   Hepatic Function Panel     Component Value Date/Time   PROT 6.8 06/10/2018 0956   ALBUMIN 4.3 06/10/2018 0956   AST 20 06/10/2018 0956   ALT 23 06/10/2018 0956   ALKPHOS 96 06/10/2018 0956   BILITOT 0.4 06/10/2018 0956   BILIDIR 0.1 01/28/2014 0914   IBILI 0.3 01/28/2014 0914      Component Value Date/Time   TSH 2.110 06/10/2018 0956   TSH 1.78 02/05/2018 0913   TSH 2.29 02/12/2017 0747  Results for [facility], [facility]" (MRN [id number]) as of 07/24/2018 12:42  Ref. Range 06/10/2018 09:56  Vitamin D, 25-Hydroxy Latest Ref Range: 30.0 - 100.0 ng/mL 30.8   OBESITY BEHAVIORAL INTERVENTION VISIT  Today's visit was #4  Starting weight: 221 lbs Starting date: 06/10/2018 Today's weight: 212 lbs Today's date: 07/24/2018 Total lbs lost to  date: 9    07/24/2018  Height 5\' 3"  (1.6 m)  Weight 212 lb (96.2 kg)  BMI (Calculated) 37.56  BLOOD PRESSURE - SYSTOLIC 867  BLOOD PRESSURE - DIASTOLIC 70   Body Fat % 61.9 %  Total Body Water (lbs) 85.6 lbs   ASK: We discussed the diagnosis of obesity with Jake Church today and Jaeanna agreed to give [name] permission to discuss obesity behavioral modification therapy today.  ASSESS: Adelle has the diagnosis of obesity and her BMI today is 37.56. Rosealie is in the action stage of change.   ADVISE: Laquinda was educated on the multiple health risks of obesity as well as the benefit of weight loss to improve her health. She was advised of the need for long term treatment and the importance of lifestyle modifications to improve her current health and to decrease her risk of future health problems.  AGREE: Multiple dietary modification options and treatment options were discussed and  Maame agreed to follow the recommendations documented in the above note.  ARRANGE: Anacaren was educated on the importance of frequent visits to treat obesity as outlined per CMS and USPSTF guidelines and agreed to schedule her next follow up appointment today.  Migdalia Dk, am acting as transcriptionist for Abby Potash, PA-C I, Abby Potash, PA-C have reviewed above note and agree with its content

## 2018-08-01 ENCOUNTER — Telehealth: Payer: Self-pay | Admitting: *Deleted

## 2018-08-01 NOTE — Telephone Encounter (Addendum)
Called and spoke with the patient regarding her referral. Appt scheduled tentatively for 3/25 at 10:15am. Patient given appt date/time and information regarding free valet and possible pelvic exam. Patient verbalized understanding. Patient did state "I want to let you know that my husband works for the city of [place], they have had two employees test positive. So he and myself are quarantined until at least the test results come back. He was tested today and was told results would be ready in 2-4 days. He was also told that they are calling people with results on Sunday. Can I call you back once I have the results?" Explained that we will still hold her appt slot and wait to hear back from her. Also explained that if she can keep the appt we would call her the day before to pre-screen her; and she would be screened again on the day of the appt. Explained that she can have one visitor over the age of 60.  [facility] APP notified

## 2018-08-06 ENCOUNTER — Telehealth: Payer: Self-pay | Admitting: *Deleted

## 2018-08-06 NOTE — Telephone Encounter (Signed)
Patient called stated that her husband test results are not back in, and moved her apapt to next wee. She stated "there was a man in the building that he works that tested positive for COVID-19. They work in different parts of the building. With my husbands health issues they told him to get tested. The results are not back yet, seven days turn around time."

## 2018-08-07 ENCOUNTER — Encounter (INDEPENDENT_AMBULATORY_CARE_PROVIDER_SITE_OTHER): Payer: Self-pay

## 2018-08-07 ENCOUNTER — Ambulatory Visit: Payer: BLUE CROSS/BLUE SHIELD | Admitting: Gynecologic Oncology

## 2018-08-12 ENCOUNTER — Encounter (INDEPENDENT_AMBULATORY_CARE_PROVIDER_SITE_OTHER): Payer: Self-pay | Admitting: Physician Assistant

## 2018-08-12 ENCOUNTER — Other Ambulatory Visit: Payer: Self-pay

## 2018-08-12 ENCOUNTER — Telehealth: Payer: Self-pay | Admitting: *Deleted

## 2018-08-12 ENCOUNTER — Ambulatory Visit (INDEPENDENT_AMBULATORY_CARE_PROVIDER_SITE_OTHER): Payer: BLUE CROSS/BLUE SHIELD | Admitting: Physician Assistant

## 2018-08-12 DIAGNOSIS — Z6837 Body mass index (BMI) 37.0-37.9, adult: Secondary | ICD-10-CM | POA: Diagnosis not present

## 2018-08-12 DIAGNOSIS — E559 Vitamin D deficiency, unspecified: Secondary | ICD-10-CM | POA: Diagnosis not present

## 2018-08-12 MED ORDER — VITAMIN D (ERGOCALCIFEROL) 1.25 MG (50000 UNIT) PO CAPS: 50000.0000 [IU] | ORAL_CAPSULE | ORAL | 0 refills | Status: DC

## 2018-08-12 NOTE — Telephone Encounter (Signed)
Called and spoke with the patient regarding her appt. Per the patient "My husband's COVID-19 test was negative and we both have no signs or symptoms." Per Threasa Beards ok for the patient to keep appt.

## 2018-08-13 ENCOUNTER — Telehealth: Payer: Self-pay | Admitting: *Deleted

## 2018-08-13 NOTE — Progress Notes (Addendum)
Office: [phone]  /  Fax: [phone] TeleHealth Visit:  Lisa Stanton has consented to this TeleHealth visit today via telephone call. The patient is located at home, the provider is located at the [facility] office. The participants in this visit include the listed provider and patient and any and all parties involved.   HPI:   Chief Complaint: OBESITY Lisa Stanton is here to discuss her progress with her obesity treatment plan. She is on the Category 3 plan and is following her eating plan approximately 80 % of the time. She states she is exercising 0 minutes 0 times per week. Lisa Stanton reports that she has maintained her weight. She was just diagnosed with ovarian cancer and she has been stressed. Lisa Stanton has been stress eating here and there. She has an appointment with the oncologist in two days. We were unable to weight the patient today for this TeleHealth visit.She feels as if she has maintained weight since her last visit. She has lost 9 lbs since starting treatment with Lisa Stanton.  Vitamin D deficiency [name] has a diagnosis of vitamin D deficiency. She is currently taking vit D and denies nausea, vomiting or muscle weakness.  ASSESSMENT AND PLAN:  Class 2 severe obesity with serious comorbidity and body mass index (BMI) of 37.0 to 37.9 in adult, unspecified obesity type ([place])  Vitamin D deficiency - Plan: Vitamin D, Ergocalciferol, (DRISDOL) 1.25 MG (50000 UT) CAPS capsule  PLAN:  Vitamin D Deficiency Lisa Stanton was informed that low vitamin D levels contributes to fatigue and are associated with obesity, breast, and colon cancer. She agrees to continue to take prescription Vit D @50 ,000 IU every week #4 with no refills and will follow up for routine testing of vitamin D, at least 2-3 times per year. She was informed of the risk of over-replacement of vitamin D and agrees to not increase her dose unless she discusses this with Lisa Stanton first. Lisa Stanton agrees to  follow up as directed.  I spent > than 50% of the 15 minute visit on counseling as documented in the note.  Obesity Lisa Stanton is currently in the action stage of change. As such, her goal is to continue with weight loss efforts She has agreed to follow the Category 3 plan Lisa Stanton has been instructed to work up to a goal of 150 minutes of combined cardio and strengthening exercise per week for weight loss and overall health benefits. We discussed the following Behavioral Modification Strategies today: keeping healthy foods in the home and  work on meal planning and easy cooking plans  Lisa Stanton has agreed to follow up with our clinic in 2 weeks. She was informed of the importance of frequent follow up visits to maximize her success with intensive lifestyle modifications for her multiple health conditions.  ALLERGIES: Allergies  Allergen Reactions  . Benicar Hct [Olmesartan Medoxomil-Hctz] Shortness Of Breath  . Lisinopril     cough  . Singulair [Montelukast Sodium]     cough    MEDICATIONS: Current Outpatient Medications on File Prior to Visit  Medication Sig Dispense Refill  . Azilsartan-Chlorthalidone (EDARBYCLOR) 40-12.5 MG TABS Take 1 tablet by mouth daily. 90 tablet 3  . BELSOMRA 10 MG TABS TAKE 1 TABLET BY MOUTH DAILY. 30 tablet 1  . cetirizine (ZYRTEC) 10 MG tablet Take 10 mg by mouth daily.    . cholecalciferol (VITAMIN D) 1000 UNITS tablet Take 1,000 Units by mouth daily.    [name] docusate sodium (COLACE) 100 MG capsule Take 1 capsule (100  mg total) by mouth 2 (two) times daily. 60 capsule 0  . fluticasone (FLONASE) 50 MCG/ACT nasal spray Place 2 sprays into both nostrils daily as needed for allergies or rhinitis.    [name] ibuprofen (ADVIL,MOTRIN) 800 MG tablet Take 800 mg by mouth every 8 (eight) hours as needed.    . Multiple Vitamin (MULTIVITAMIN) tablet Take 1 tablet by mouth daily.      [name] omeprazole (PRILOSEC) 20 MG capsule Take 20 mg by mouth daily.    . pravastatin  (PRAVACHOL) 80 MG tablet Take 1 tablet (80 mg total) by mouth daily. 90 tablet 3  . Sennosides 8.6 MG CAPS Take 1 capsule by mouth daily.     No current facility-administered medications on file prior to visit.     PAST MEDICAL HISTORY: Past Medical History:  Diagnosis Date  . Anemia   . Arthritis   . Back pain   . Constipation   . Dyspnea   . Fatty liver   . Gallbladder problem   . GERD (gastroesophageal reflux disease)   . Hip pain   . Hyperlipidemia   . Hypertension   . Iron deficiency anemia   . Joint pain   . Knee pain   . Lower leg edema   . Murmur   . Thyroid nodule   . Vitamin B 12 deficiency     PAST SURGICAL HISTORY: Past Surgical History:  Procedure Laterality Date  . ABDOMINAL HERNIA REPAIR  09/2010   incarcerated that required surgery.    . ABDOMINAL HYSTERECTOMY    . ABDOMINOPLASTY  2006  . APPENDECTOMY  February 2004  . CARPAL TUNNEL RELEASE  May 1998   Right hand  . CHOLECYSTECTOMY  February 2003  . Cysts removed  September 1993   Left hand  . HERNIA REPAIR    . KNEE SURGERY  July 1986   Left knee  . PARTIAL HYSTERECTOMY  November 2010  . ROUX-EN-Y GASTRIC BYPASS  06/2000  . UMBILICAL HERNIA REPAIR      SOCIAL HISTORY: Social History   Tobacco Use  . Smoking status: Never Smoker  . Smokeless tobacco: Never Used  Substance Use Topics  . Alcohol use: Yes    Comment: 6 times/year  . Drug use: No    FAMILY HISTORY: Family History  Problem Relation Age of Onset  . Osteoporosis Mother   . Rheum arthritis Mother   . Kidney disease Mother   . Thyroid disease Mother   . Obesity Mother   . Breast cancer Maternal Grandmother   . Colon cancer Maternal Grandfather   . Thyroid cancer Brother 44  . Cancer Brother        thyroid  . Diabetes Father   . Hypertension Father   . Hyperlipidemia Father   . Cancer Father        pancreatic  . Obesity Father   . Diabetes Paternal Grandfather   . Heart disease Paternal Grandfather   . Cancer  Sister        thyroid    ROS: Review of Systems  Constitutional: Negative for weight loss.  Gastrointestinal: Negative for nausea and vomiting.  Musculoskeletal:       Negative for muscle weakness    PHYSICAL EXAM: Pt in no acute distress  RECENT LABS AND TESTS: BMET    Component Value Date/Time   NA 139 06/10/2018 0956   K 3.9 06/10/2018 0956   CL 98 06/10/2018 0956   CL 97 06/26/2014   CO2 26 06/10/2018  0956   GLUCOSE 83 06/10/2018 0956   GLUCOSE 100 (H) 02/05/2018 0913   BUN 10 06/10/2018 0956   CREATININE 0.61 06/10/2018 0956   CREATININE 0.65 02/05/2018 0913   CALCIUM 9.4 06/10/2018 0956   CALCIUM 10.4 01/26/2015   CALCIUM 9.1 06/26/2014   GFRNONAA 105 06/10/2018 0956   GFRNONAA 103 02/05/2018 0913   GFRAA 121 06/10/2018 0956   GFRAA 119 02/05/2018 0913   Lab Results  Component Value Date   HGBA1C 5.8 (H) 06/10/2018   HGBA1C 6.1 (H) 08/22/2017   HGBA1C 5.9 (H) 02/12/2017   HGBA1C 6.1 (H) 01/25/2016   HGBA1C 6.4 02/23/2015   Lab Results  Component Value Date   INSULIN 24.1 06/10/2018   CBC    Component Value Date/Time   WBC 9.4 06/10/2018 0956   WBC 9.3 02/12/2017 0747   RBC 4.25 06/10/2018 0956   RBC 4.19 02/12/2017 0747   HGB 12.5 06/10/2018 0956   HCT 37.7 06/10/2018 0956   PLT 355 02/12/2017 0747   MCV 89 06/10/2018 0956   MCH 29.4 06/10/2018 0956   MCH 29.8 02/12/2017 0747   MCHC 33.2 06/10/2018 0956   MCHC 33.8 02/12/2017 0747   RDW 14.5 06/10/2018 0956   LYMPHSABS 3.4 (H) 06/10/2018 0956   MONOABS 522 01/25/2016 0919   EOSABS 0.1 06/10/2018 0956   BASOSABS 0.1 06/10/2018 0956   Iron/TIBC/Ferritin/ %Sat    Component Value Date/Time   IRON 96 06/10/2018 0956   TIBC 353 06/10/2018 0956   FERRITIN 62 06/10/2018 0956   IRONPCTSAT 27 06/10/2018 0956   Lipid Panel     Component Value Date/Time   CHOL 189 06/10/2018 0956   TRIG 139 06/10/2018 0956   HDL 53 06/10/2018 0956   CHOLHDL 4.0 02/05/2018 0913   VLDL 28 01/25/2016 0919    LDLCALC 108 (H) 06/10/2018 0956   LDLCALC 103 (H) 02/05/2018 0913   Hepatic Function Panel     Component Value Date/Time   PROT 6.8 06/10/2018 0956   ALBUMIN 4.3 06/10/2018 0956   AST 20 06/10/2018 0956   ALT 23 06/10/2018 0956   ALKPHOS 96 06/10/2018 0956   BILITOT 0.4 06/10/2018 0956   BILIDIR 0.1 01/28/2014 0914   IBILI 0.3 01/28/2014 0914      Component Value Date/Time   TSH 2.110 06/10/2018 0956   TSH 1.78 02/05/2018 0913   TSH 2.29 02/12/2017 0747     Ref. Range 06/10/2018 09:56  Vitamin D, 25-Hydroxy Latest Ref Range: 30.0 - 100.0 ng/mL 30.8     I, Doreene Nest, am acting as Location manager for Abby Potash, PA-C I, Abby Potash, PA-C have reviewed above note and agree with its content

## 2018-08-13 NOTE — Telephone Encounter (Signed)
Called and spoke with the patient regarding her appt for tomorrow. Prescreened the patient for the COIVD-19, patient not having any signs/symptoms and has not traveled. Explained that she needs to arrive 30 minutes early to be screened again, along with a temperature. Also explained the no visitor policy.

## 2018-08-14 ENCOUNTER — Inpatient Hospital Stay: Payer: BLUE CROSS/BLUE SHIELD | Attending: Gynecologic Oncology | Admitting: Gynecologic Oncology

## 2018-08-14 ENCOUNTER — Inpatient Hospital Stay: Payer: BLUE CROSS/BLUE SHIELD

## 2018-08-14 ENCOUNTER — Other Ambulatory Visit: Payer: Self-pay

## 2018-08-14 ENCOUNTER — Encounter: Payer: Self-pay | Admitting: Gynecologic Oncology

## 2018-08-14 VITALS — BP 140/87 | HR 64 | Temp 98.6°F | Resp 18 | Ht 63.0 in | Wt 211.0 lb

## 2018-08-14 DIAGNOSIS — Z803 Family history of malignant neoplasm of breast: Secondary | ICD-10-CM | POA: Diagnosis not present

## 2018-08-14 DIAGNOSIS — R971 Elevated cancer antigen 125 [CA 125]: Secondary | ICD-10-CM | POA: Diagnosis not present

## 2018-08-14 DIAGNOSIS — Z79899 Other long term (current) drug therapy: Secondary | ICD-10-CM | POA: Diagnosis not present

## 2018-08-14 DIAGNOSIS — E669 Obesity, unspecified: Secondary | ICD-10-CM | POA: Insufficient documentation

## 2018-08-14 DIAGNOSIS — Z9884 Bariatric surgery status: Secondary | ICD-10-CM | POA: Insufficient documentation

## 2018-08-14 DIAGNOSIS — I10 Essential (primary) hypertension: Secondary | ICD-10-CM | POA: Diagnosis not present

## 2018-08-14 DIAGNOSIS — Z90710 Acquired absence of both cervix and uterus: Secondary | ICD-10-CM

## 2018-08-14 DIAGNOSIS — R935 Abnormal findings on diagnostic imaging of other abdominal regions, including retroperitoneum: Secondary | ICD-10-CM

## 2018-08-14 DIAGNOSIS — K219 Gastro-esophageal reflux disease without esophagitis: Secondary | ICD-10-CM | POA: Insufficient documentation

## 2018-08-14 DIAGNOSIS — N838 Other noninflammatory disorders of ovary, fallopian tube and broad ligament: Secondary | ICD-10-CM

## 2018-08-14 DIAGNOSIS — R7303 Prediabetes: Secondary | ICD-10-CM | POA: Insufficient documentation

## 2018-08-14 DIAGNOSIS — Z808 Family history of malignant neoplasm of other organs or systems: Secondary | ICD-10-CM | POA: Insufficient documentation

## 2018-08-14 DIAGNOSIS — Z800 Family history of malignant neoplasm of digestive organs: Secondary | ICD-10-CM | POA: Diagnosis not present

## 2018-08-14 DIAGNOSIS — N949 Unspecified condition associated with female genital organs and menstrual cycle: Secondary | ICD-10-CM

## 2018-08-14 DIAGNOSIS — E7800 Pure hypercholesterolemia, unspecified: Secondary | ICD-10-CM | POA: Diagnosis not present

## 2018-08-14 DIAGNOSIS — N83291 Other ovarian cyst, right side: Secondary | ICD-10-CM | POA: Insufficient documentation

## 2018-08-14 DIAGNOSIS — K439 Ventral hernia without obstruction or gangrene: Secondary | ICD-10-CM | POA: Diagnosis not present

## 2018-08-14 DIAGNOSIS — Z85850 Personal history of malignant neoplasm of thyroid: Secondary | ICD-10-CM | POA: Insufficient documentation

## 2018-08-14 DIAGNOSIS — R600 Localized edema: Secondary | ICD-10-CM | POA: Diagnosis not present

## 2018-08-14 DIAGNOSIS — E785 Hyperlipidemia, unspecified: Secondary | ICD-10-CM | POA: Diagnosis not present

## 2018-08-14 DIAGNOSIS — Z6837 Body mass index (BMI) 37.0-37.9, adult: Secondary | ICD-10-CM | POA: Diagnosis not present

## 2018-08-14 DIAGNOSIS — R102 Pelvic and perineal pain: Secondary | ICD-10-CM | POA: Diagnosis not present

## 2018-08-14 LAB — BASIC METABOLIC PANEL
Anion gap: 11 (ref 5–15)
BUN: 15 mg/dL (ref 6–20)
CO2: 29 mmol/L (ref 22–32)
Calcium: 9.5 mg/dL (ref 8.9–10.3)
Chloride: 103 mmol/L (ref 98–111)
Creatinine, Ser: 0.71 mg/dL (ref 0.44–1.00)
GFR calc Af Amer: >60 mL/min (ref >60)
GFR calc non Af Amer: >60 mL/min (ref >60)
Glucose, Bld: 93 mg/dL (ref 70–99)
Potassium: 3.8 mmol/L (ref 3.5–5.1)
Sodium: 143 mmol/L (ref 135–145)

## 2018-08-14 MED ORDER — IBUPROFEN 800 MG PO TABS: 800.0000 mg | ORAL_TABLET | Freq: Three times a day (TID) | ORAL | 0 refills | Status: DC | PRN

## 2018-08-14 NOTE — Progress Notes (Signed)
Consult Note: Gyn-Onc  Consult was requested by Dr. Willis Modena for the evaluation of Lisa Stanton 53 y.o. female  CC:  Chief Complaint  Patient presents with  . Elevated cancer antigen 125 (CA-125)  . Abnormal ultrasound of ovary    Assessment/Plan:  Lisa Stanton  is a 53 y.o.  year old with a right ovarian complex cyst in the setting of an elevated CA 125, large ventral hernia and complex prior surgical history.  I have a low suspicion for malignancy overall, given that there are ultrasound findings consistent with endometrioma, which is consistent with her past medical history and a Ca1 25 value in the range of 160.  We will first obtain a CT abdomen and pelvis to better evaluate the upper abdomen.  This will also enable [name] to visualize the extent of the ventral hernia.  She has a planned appointment with Dr. Rosendo Gros from [facility] surgery later this month.  I work with Dr. Johney Frame team to coordinate surgery ideally in late May or early June for combined procedure.  At that time we will remove the ovaries, and repair the ventral hernia if Dr. Rosendo Gros thinks this is appropriate.  We will send the ovary for frozen section evaluation and perform staging if malignancy is identified.  I explained to LisaAument that she is at higher than average risk for postoperative complications due to her obesity (BMI 37 kg/m), and history of extensive prior abdominal surgeries including a laparotomy following a complication from gastric bypass surgery, and prior ventral hernia repair with mesh.  I explained that these risks are particularly related to risk of visceral injury.  But she also has risks including  bleeding, infection, damage to internal organs (such as bladder,ureters, bowels), blood clot, reoperation and rehospitalization.   She is understanding of this and desires to proceed with a minimally invasive procedure.  She understands that conversion to laparotomy might be necessary  depending upon intraoperative findings.  She has a significant history of malignancy in her family, and therefore I am recommending consideration of BSO even if a benign lesion is found on the right ovary.  She will contemplate this further and inform me prior to her surgery whether she desires to have 1 of both ovaries removed.  She understands if malignancy is identified in the right ovary that we would recommend removing the left ovary as part of staging procedure.   HPI: Lisa Stanton is a 53 year old P0 who is seen in consultation at the request of Dr Meissinger for a right ovarian complex cystic mass and elevated CA 125.   The patient reports having at least 10 years history of intermittent menstrual cramp-like pain.  She reports that out of the blue in March 2020 this became a more constant pain.  Ibuprofen helped a little but was also associated with some hip pain.  As response to developing this pain symptoms she was evaluated by her gynecologist who performed a transvaginal ultrasound on July 30, 2018.  This revealed a normal left ovary measuring 1.9 x 1.5 x 1.4 cm.  The uterus was surgically absent.  The right ovary measured 6.98 x 1.79 x 4.98 cm.  It contained 2 ovarian cyst 1 that was slightly larger at 6 cm and the other at approximately 4 cm.  The sick centimeters cyst appeared consistent with a large endometrioma.  It was thick-walled with Doppler around the periphery.  There was a solid-appearing mass with Doppler flow closest to the vaginal cuff  measuring 3.8 cm.  Ca1 25 was drawn on July 31, 2018 and was elevated at 160 (upper limit of normal 38).  Patient has a complex past surgical history.  This includes an appendectomy with abdominoplasty in 2004.  Prior to that she had a cholecystectomy in 2003.  And prior to that in 2002 had had a gastric bypass performed laparoscopically but then complicated postoperatively by intra-abdominal bleed for which she underwent an emergent  laparotomy with repair.  The patient developed a ventral hernia which was repaired and then subsequently underwent a laparoscopic hysterectomy in 2010.  Endometriosis was identified on pathology.  The patient reports that this hysterectomy was performed due to heavy bleeding.  Subsequent to having hysterectomy she then underwent an laparoscopic ventral hernia repair with mesh at [facility].  The surgery was uncomplicated.  However following that surgery she developed recurrence of the hernia.  The patient is obese with a BMI of 37 kg/m. She has a past medical history that is significant for prediabetes, hypertension and hypercholesterolemia.  Family history significant for father with history of pancreatic cancer, mother with history of melanoma, a sister and brother both who had thyroid cancer, a maternal grandfather with history of colon cancer, maternal grandmother with history of breast cancer in her 8s.  Current Meds:  Outpatient Encounter Medications as of 08/14/2018  Medication Sig  . Azilsartan-Chlorthalidone (EDARBYCLOR) 40-12.5 MG TABS Take 1 tablet by mouth daily.  . cetirizine (ZYRTEC) 10 MG tablet Take 10 mg by mouth daily.  [name] docusate sodium (COLACE) 100 MG capsule Take 1 capsule (100 mg total) by mouth 2 (two) times daily.  . fluticasone (FLONASE) 50 MCG/ACT nasal spray Place 2 sprays into both nostrils daily as needed for allergies or rhinitis.  [name] ibuprofen (ADVIL,MOTRIN) 800 MG tablet Take 1 tablet (800 mg total) by mouth every 8 (eight) hours as needed for moderate pain.  . Multiple Vitamin (MULTIVITAMIN) tablet Take 1 tablet by mouth daily.    [name] omeprazole (PRILOSEC) 20 MG capsule Take 20 mg by mouth daily.  . pravastatin (PRAVACHOL) 80 MG tablet Take 1 tablet (80 mg total) by mouth daily.  . Vitamin D, Ergocalciferol, (DRISDOL) 1.25 MG (50000 UT) CAPS capsule Take 1 capsule (50,000 Units total) by mouth every 7 (seven) days.  . [DISCONTINUED] ibuprofen (ADVIL,MOTRIN) 800 MG  tablet Take 800 mg by mouth every 8 (eight) hours as needed.  . [DISCONTINUED] BELSOMRA 10 MG TABS TAKE 1 TABLET BY MOUTH DAILY.  . [DISCONTINUED] cholecalciferol (VITAMIN D) 1000 UNITS tablet Take 1,000 Units by mouth daily.  . [DISCONTINUED] Sennosides 8.6 MG CAPS Take 1 capsule by mouth daily.   No facility-administered encounter medications on file as of 08/14/2018.     Allergy:  Allergies  Allergen Reactions  . Benicar Hct [Olmesartan Medoxomil-Hctz] Shortness Of Breath  . Lisinopril     cough  . Singulair [Montelukast Sodium]     cough    Social Hx:   Social History   Socioeconomic History  . Marital status: Married    Spouse name: Doren Custard  . Number of children: 0  . Years of education: HS  . Highest education level: Not on file  Occupational History  . Occupation: [employer]  . Financial resource strain: Not on file  . Food insecurity:    Worry: Not on file    Inability: Not on file  . Transportation needs:    Medical: Not on file  Non-medical: Not on file  Tobacco Use  . Smoking status: Never Smoker  . Smokeless tobacco: Never Used  Substance and Sexual Activity  . Alcohol use: Yes    Comment: 6 times/year  . Drug use: No  . Sexual activity: Yes    Partners: Male  Lifestyle  . Physical activity:    Days per week: Not on file    Minutes per session: Not on file  . Stress: Not on file  Relationships  . Social connections:    Talks on phone: Not on file    Gets together: Not on file    Attends religious service: Not on file    Active member of club or organization: Not on file    Attends meetings of clubs or organizations: Not on file    Relationship status: Not on file  . Intimate partner violence:    Fear of current or ex partner: Not on file    Emotionally abused: Not on file    Physically abused: Not on file    Forced sexual activity: Not on file  Other Topics Concern  . Not on file  Social History  Narrative   2 caffeine per day. Regular exercise, walking.      Past Surgical Hx:  Past Surgical History:  Procedure Laterality Date  . ABDOMINAL HERNIA REPAIR  09/2010   incarcerated that required surgery.    . ABDOMINAL HYSTERECTOMY    . ABDOMINOPLASTY  2006  . APPENDECTOMY  February 2004  . CARPAL TUNNEL RELEASE  May 1998   Right hand  . CHOLECYSTECTOMY  February 2003  . Cysts removed  September 1993   Left hand  . HERNIA REPAIR    . KNEE SURGERY  July 1986   Left knee  . PARTIAL HYSTERECTOMY  November 2010  . ROUX-EN-Y GASTRIC BYPASS  06/2000  . UMBILICAL HERNIA REPAIR      Past Medical Hx:  Past Medical History:  Diagnosis Date  . Anemia   . Arthritis   . Back pain   . Constipation   . Dyspnea   . Fatty liver   . Gallbladder problem   . GERD (gastroesophageal reflux disease)   . Hip pain   . Hyperlipidemia   . Hypertension   . Iron deficiency anemia   . Joint pain   . Knee pain   . Lower leg edema   . Murmur   . Thyroid nodule   . Vitamin B 12 deficiency     Past Gynecological History:  See HPI Patient's last menstrual period was 03/15/2009.  Family Hx:  Family History  Problem Relation Age of Onset  . Osteoporosis Mother   . Rheum arthritis Mother   . Kidney disease Mother   . Thyroid disease Mother   . Obesity Mother   . Breast cancer Maternal Grandmother   . Colon cancer Maternal Grandfather   . Thyroid cancer Brother 15  . Cancer Brother        thyroid  . Diabetes Father   . Hypertension Father   . Hyperlipidemia Father   . Cancer Father        pancreatic  . Obesity Father   . Diabetes Paternal Grandfather   . Heart disease Paternal Grandfather   . Cancer Sister        thyroid    Review of Systems:  Constitutional  Feels well,    ENT Normal appearing ears and nares bilaterally Skin/Breast  No rash, sores, jaundice, itching, dryness Cardiovascular  No chest pain, shortness of breath, or edema  Pulmonary  No cough or wheeze.   Gastro Intestinal  No nausea, vomitting, or diarrhoea. No bright red blood per rectum, no abdominal pain, change in bowel movement, or constipation.  Genito Urinary  No frequency, urgency, dysuria, + pelvic pain Musculo Skeletal  No myalgia, arthralgia, joint swelling or pain  Neurologic  No weakness, numbness, change in gait,  Psychology  No depression, anxiety, insomnia.   Vitals:  Blood pressure 140/87, pulse 64, temperature 98.6 F (37 C), temperature source Oral, resp. rate 18, height 5\' 3"  (1.6 m), weight 211 lb (95.7 kg), last menstrual period 03/15/2009, SpO2 100 %.  Physical Exam: WD in NAD Neck  Supple NROM, without any enlargements.  Lymph Node Survey No cervical supraclavicular or inguinal adenopathy Cardiovascular  Pulse normal rate, regularity and rhythm. S1 and S2 normal.  Lungs  Clear to auscultation bilateraly, without wheezes/crackles/rhonchi. Good air movement.  Skin  No rash/lesions/breakdown  Psychiatry  Alert and oriented to person, place, and time  Abdomen  Normoactive bowel sounds, abdomen soft, non-tender and obese with evidence of a moderately large upper abdominal ventral hernia.  Back No CVA tenderness Genito Urinary  Vulva/vagina: Normal external female genitalia.   No lesions. No discharge or bleeding.  Bladder/urethra:  No lesions or masses, well supported bladder  Vagina: normal, no lesions  Cervix and uterus surgically absent  Adnexa: fullness but no discrete masses. Rectal  Good tone, no masses no cul de sac nodularity.  Extremities  No bilateral cyanosis, clubbing or edema.   Thereasa Solo, MD  08/14/2018, 7:08 PM

## 2018-08-14 NOTE — Patient Instructions (Addendum)
Plan to have a metabolic panel drawn today to evaluate your kidney function prior to receiving contrast for the CT scan.  Plan to have a CT scan of the abdomen and pelvis to better evaluate the ovarian mass and to look for any concerns of malignancy or cancer in the abdomen and pelvis.  Dr Denman George does not think that this mass is most likely a cancer. She favors it being an endometriosis cyst. It is safe to wait 6-12 weeks to remove this mass, even if it is a cancer (that wait time is not associated with increasing the stage of the cancer). This would allow for Dr Denman George to facilitate coordinating a hernia repair at the same time with Dr Rosendo Gros. It would also mean that the COVID-19 virus would have likely dissipated somewhat at the time of your surgery and recovery which makes risk of transmission during hospitalization less.  The procedure would be an outpatient laparoscopic (with robotic assistance) procedure in which both ovaries are removed (or just the right side if you prefer), and separation of scar tissue and possible staging (sampling of lymph nodes if cancer is found on "frozen section" pathology/biopsy of the mass).   Preparing for your Surgery  Plan for surgery with Dr. Everitt Amber and Dr. Lyman Bishop at [facility] will be scheduled for a robotic assisted bilateral salpingo-oophorectomy, lysis of adhesions, possible staging along with a hernia repair with Dr. Rosendo Gros.  Pre-operative Testing -You will receive a phone call from presurgical testing at [facility] if you have not received a call already to arrange for a pre-operative testing appointment before your surgery.  This appointment normally occurs one to two weeks before your scheduled surgery.   -Bring your insurance card, copy of an advanced directive if applicable, medication list  -At that visit, you will be asked to sign a consent for a possible blood transfusion in case a transfusion becomes necessary during  surgery.  The need for a blood transfusion is rare but having consent is a necessary part of your care.     -You should not be taking blood thinners or aspirin at least ten days prior to surgery unless instructed by your surgeon.  -As part of our enhanced surgical recovery pathway, you may be advised to drink a carbohydrate drink the morning of surgery (at least 3 hours before). If you are diabetic, this will be substituted with G2 gatorade in order to prevent elevated glucose levels prior to surgery.  -Do not take supplements such as fish oil (omega 3), red yeast rice, tumeric before your surgery.  Day Before Surgery at [facility] will be asked to take in a light diet the day before surgery.  Avoid carbonated beverages.  You will be advised to have nothing to eat or drink after midnight the evening before.    Eat a light diet the day before surgery.  Examples including soups, broths, toast, yogurt, mashed potatoes.  Things to avoid include carbonated beverages (fizzy beverages), raw fruits and raw vegetables, or beans.   If your bowels are filled with gas, your surgeon will have difficulty visualizing your pelvic organs which increases your surgical risks.  Your role in recovery Your role is to become active as soon as directed by your doctor, while still giving yourself time to heal.  Rest when you feel tired. You will be asked to do the following in order to speed your recovery:  - Cough and breathe deeply. This helps toclear and  expand your lungs and can prevent pneumonia. You may be given a spirometer to practice deep breathing. A staff member will show you how to use the spirometer. - Do mild physical activity. Walking or moving your legs help your circulation and body functions return to normal. A staff member will help you when you try to walk and will provide you with simple exercises. Do not try to get up or walk alone the first time. - Actively manage your pain. Managing your pain lets  you move in comfort. We will ask you to rate your pain on a scale of zero to 10. It is your responsibility to tell your doctor or nurse where and how much you hurt so your pain can be treated.  Special Considerations -If you are diabetic, you may be placed on insulin after surgery to have closer control over your blood sugars to promote healing and recovery.  This does not mean that you will be discharged on insulin.  If applicable, your oral antidiabetics will be resumed when you are tolerating a solid diet.  -Your final pathology results from surgery should be available around one week after surgery and the results will be relayed to you when available.  -Dr. Lahoma Crocker is the surgeon that assists your GYN Oncologist with surgery.  If you end up staying the night, the next day after your surgery you will either see Dr. Denman George or Dr. Lahoma Crocker.  -FMLA forms can be faxed to [phone] and please allow 5-7 business days for completion.  Pain Management After Surgery -You will be prescribed your pain medication and bowel regimen medications before surgery so that you can have these available when you are discharged from the hospital. The pain medication is for use ONLY AFTER surgery and a new prescription will not be given.   -Make sure that you have Tylenol and Ibuprofen at home to use on a regular basis after surgery for pain control. We recommend alternating the medications every hour to six hours since they work differently and are processed in the body differently for pain relief.  -Review the attached handout on narcotic use and their risks and side effects.   Bowel Regimen -You will be prescribed Sennakot-S to take nightly to prevent constipation especially if you are taking the narcotic pain medication intermittently.  It is important to prevent constipation and drink adequate amounts of liquids.  Blood Transfusion Information WHAT IS A BLOOD TRANSFUSION? A transfusion  is the replacement of blood or some of its parts. Blood is made up of multiple cells which provide different functions.  Red blood cells carry oxygen and are used for blood loss replacement.  White blood cells fight against infection.  Platelets control bleeding.  Plasma helps clot blood.  Other blood products are available for specialized needs, such as hemophilia or other clotting disorders. BEFORE THE TRANSFUSION  Who gives blood for transfusions?   You may be able to donate blood to be used at a later date on yourself (autologous donation).  Relatives can be asked to donate blood. This is generally not any safer than if you have received blood from a stranger. The same precautions are taken to ensure safety when a relative's blood is donated.  Healthy volunteers who are fully evaluated to make sure their blood is safe. This is blood bank blood. Transfusion therapy is the safest it has ever been in the practice of medicine. Before blood is taken from a donor, a complete history is taken  to make sure that person has no history of diseases nor engages in risky social behavior (examples are intravenous drug use or sexual activity with multiple partners). The donor's travel history is screened to minimize risk of transmitting infections, such as malaria. The donated blood is tested for signs of infectious diseases, such as HIV and hepatitis. The blood is then tested to be sure it is compatible with you in order to minimize the chance of a transfusion reaction. If you or a relative donates blood, this is often done in anticipation of surgery and is not appropriate for emergency situations. It takes many days to process the donated blood. RISKS AND COMPLICATIONS Although transfusion therapy is very safe and saves many lives, the main dangers of transfusion include:   Getting an infectious disease.  Developing a transfusion reaction. This is an allergic reaction to something in the blood you  were given. Every precaution is taken to prevent this. The decision to have a blood transfusion has been considered carefully by your caregiver before blood is given. Blood is not given unless the benefits outweigh the risks.

## 2018-08-16 ENCOUNTER — Encounter (HOSPITAL_COMMUNITY): Payer: Self-pay

## 2018-08-16 ENCOUNTER — Telehealth: Payer: Self-pay

## 2018-08-16 ENCOUNTER — Other Ambulatory Visit: Payer: Self-pay

## 2018-08-16 ENCOUNTER — Ambulatory Visit (HOSPITAL_COMMUNITY)
Admission: RE | Admit: 2018-08-16 | Discharge: 2018-08-16 | Disposition: A | Discharge status: Home / Self Care | Payer: BLUE CROSS/BLUE SHIELD | Source: Ambulatory Visit | Attending: Gynecologic Oncology | Admitting: Gynecologic Oncology

## 2018-08-16 DIAGNOSIS — N838 Other noninflammatory disorders of ovary, fallopian tube and broad ligament: Secondary | ICD-10-CM | POA: Diagnosis not present

## 2018-08-16 MED ORDER — SODIUM CHLORIDE (PF) 0.9 % IJ SOLN
INTRAMUSCULAR | Status: AC
  Filled 2018-08-16: qty 50

## 2018-08-16 MED ORDER — IOHEXOL 300 MG/ML  SOLN
100.0000 mL | Freq: Once | INTRAMUSCULAR | Status: AC | PRN
  Administered 2018-08-16: 100 mL via INTRAVENOUS

## 2018-08-16 NOTE — Telephone Encounter (Signed)
-----   Message from Dorothyann Gibbs, NP sent at 08/16/2018 10:54 AM EDT ----- Can you please let her know her CT scan results?  It shows the complex ovarian mass that we know about.  There are no signs of enlarged lymph nodes or metastatic disease.  It is showing a large area in the stomach which they feel is a bezoar (collection of indigestable material) in her stomach. Dr. Rosendo Gros will be able to tell her more since that is not in our expertise. It notes GERD or decreased esop motility and aortic atherosclerosis which you can tell her we will route results to her PCP. Thanks

## 2018-08-16 NOTE — Telephone Encounter (Signed)
Requested that she callback to the office to discuss the results of the CT scan as noted below by Joylene John, NP.

## 2018-08-16 NOTE — Telephone Encounter (Signed)
Discussed the results as noted below by Joylene John, NP with Ms Tilmon. Routed Scan results to [place]. Breeback PA-c

## 2018-08-25 ENCOUNTER — Encounter (INDEPENDENT_AMBULATORY_CARE_PROVIDER_SITE_OTHER): Payer: Self-pay | Admitting: Physician Assistant

## 2018-08-27 ENCOUNTER — Ambulatory Visit (INDEPENDENT_AMBULATORY_CARE_PROVIDER_SITE_OTHER): Payer: BLUE CROSS/BLUE SHIELD | Admitting: Physician Assistant

## 2018-09-04 ENCOUNTER — Ambulatory Visit: Payer: Self-pay | Admitting: General Surgery

## 2018-09-04 NOTE — H&P (Signed)
History of Present Illness Ralene Ok MD; 09/04/2018 10:13 AM) The patient is a 53 year old female who presents with an incisional hernia. Referred by: Dr. Everitt Amber Chief Complaint: Incisional hernia  Patient is a 53 year old female with a history of ovarian masses. Patient was recently seen by Dr. Denman George to schedule surgery for excision of ovarian masses. Patient underwent CT scan which revealed a large incisional hernia. Hernia measures approximately 7 cm. Review this personally.  Patient does have a history of having laparoscopic gastric bypass in year 2000. Patient states she had reoperation 6 hours after her initial surgery secondary to hemorrhage. Patient states that in 2006 she underwent laparoscopic incisional hernia repair with mesh. It appears that these were at an outside institution.  Patient does state that she has symptoms of early fullness and pain with eating. She does state that she is able to reduce her hernia. She has had a history of incarceration in the past with the hernia.  Of note the patient's recent CT scan did reveal a 9 x 6.5 cm gastric bezoar within the gastric pouch. This has not been worked up in the past.   Past Surgical History Sabino Gasser, [place]; 09/04/2018 9:47 AM) Appendectomy  Gallbladder Surgery - Laparoscopic  Gastric Bypass  Hysterectomy (due to cancer) - Partial  Knee Surgery  Left. Laparoscopic Inguinal Hernia Surgery  Right. Oral Surgery  Ventral / Umbilical Hernia Surgery  Right.  Diagnostic Studies History Sabino Gasser, CMA; 09/04/2018 9:47 AM) Colonoscopy  1-5 years ago Mammogram  1-3 years ago Pap Smear  >5 years ago  Allergies Sabino Gasser, [place]; 09/04/2018 9:48 AM) No Known Drug Allergies [09/04/2018]: Allergies Reconciled   Medication History Sabino Gasser, CMA; 09/04/2018 9:49 AM) Vitamin D (Ergocalciferol) (1.25 MG(50000 UT) Capsule, Oral) Active. Pravastatin Sodium (80MG  Tablet, Oral)  Active. Edarbyclor (40-12.5MG  Tablet, Oral) Active. Cetirizine HCl (10MG  Capsule, Oral) Active. Medications Reconciled  Social History Sabino Gasser, CMA; 09/04/2018 9:47 AM) Alcohol use  Occasional alcohol use. No drug use  Tobacco use  Never smoker.  Family History Sabino Gasser, [place]; 09/04/2018 9:47 AM) Arthritis  Mother. Breast Cancer  Family Members In General. Cancer  Brother, Family Members In General, Sister. Cervical Cancer  Family Members In General. Colon Cancer  Family Members In General. Colon Polyps  Father, Mother. Diabetes Mellitus  Family Members In General, Father. Heart Disease  Family Members In General. Hypertension  Family Members In General, Father, Sister. Kidney Disease  Family Members In General, Mother. Malignant Neoplasm Of Pancreas  Father. Melanoma  Father, Mother. Ovarian Cancer  Family Members In General. Prostate Cancer  Father. Thyroid problems  Mother.  Pregnancy / Birth History Sabino Gasser, CMA; 09/04/2018 9:47 AM) Age at menarche  68 years. Contraceptive History  Depo-provera, Oral contraceptives. Gravida  2 Maternal age  45-25 Para  0  Other Problems Sabino Gasser, [place]; 09/04/2018 9:47 AM) Arthritis  Back Pain  Cholelithiasis  Gastroesophageal Reflux Disease  High blood pressure  Hypercholesterolemia  Inguinal Hernia  Pancreatitis  Thyroid Disease  Umbilical Hernia Repair     Review of Systems Ralene Ok MD; 09/04/2018 10:10 AM) General Not Present- Appetite Loss, Chills, Fatigue, Fever, Night Sweats, Weight Gain and Weight Loss. Skin Not Present- Change in Wart/Mole, Dryness, Hives, Jaundice, New Lesions, Non-Healing Wounds, Rash and Ulcer. HEENT Present- Seasonal Allergies. Not Present- Earache, Hearing Loss, Hoarseness, Nose Bleed, Oral Ulcers, Ringing in the Ears, Sinus Pain, Sore Throat, Visual Disturbances, Wears glasses/contact lenses and Yellow Eyes. Respiratory Not Present-  Bloody  sputum, Chronic Cough, Difficulty Breathing, Snoring and Wheezing. Breast Not Present- Breast Mass, Breast Pain, Nipple Discharge and Skin Changes. Cardiovascular Not Present- Chest Pain, Difficulty Breathing Lying Down, Leg Cramps, Palpitations, Rapid Heart Rate, Shortness of Breath and Swelling of Extremities. Gastrointestinal Present- Abdominal Pain, Change in Bowel Habits, Constipation, Indigestion and Vomiting. Not Present- Bloating, Bloody Stool, Chronic diarrhea, Difficulty Swallowing, Excessive gas, Gets full quickly at meals, Hemorrhoids, Nausea and Rectal Pain. Female Genitourinary Present- Frequency, Pelvic Pain and Urgency. Not Present- Nocturia and Painful Urination. Musculoskeletal Present- Back Pain and Joint Pain. Not Present- Joint Stiffness, Muscle Pain, Muscle Weakness and Swelling of Extremities. Neurological Not Present- Decreased Memory, Fainting, Headaches, Numbness, Seizures, Tingling, Tremor, Trouble walking and Weakness. Psychiatric Not Present- Anxiety, Bipolar, Change in Sleep Pattern, Depression, Fearful and Frequent crying. Endocrine Not Present- Cold Intolerance, Excessive Hunger, Hair Changes, Heat Intolerance, Hot flashes and New Diabetes. Hematology Not Present- Blood Thinners, Easy Bruising, Excessive bleeding, Gland problems, HIV and Persistent Infections. All other systems negative  Vitals Sabino Gasser CMA; 09/04/2018 9:49 AM) 09/04/2018 9:49 AM Weight: 208.38 lb Height: 63in Body Surface Area: 1.97 m Body Mass Index: 36.91 kg/m  Temp.: 98.41F(Oral)  Pulse: 74 (Regular)  BP: 130/72 (Sitting, Left Arm, Standard)       Physical Exam Ralene Ok MD; 09/04/2018 10:14 AM) The physical exam findings are as follows: Note:Constitutional: No acute distress, conversant, appears stated age  Eyes: Anicteric sclerae, moist conjunctiva, no lid lag  Neck: No thyromegaly, trachea midline, no cervical lymphadenopathy  Lungs: Clear to  auscultation biilaterally, normal respiratory effot  Cardiovascular: regular rate & rhythm, no murmurs, no peripheal edema, pedal pulses 2+  GI: Soft, no masses or hepatosplenomegaly, non-tender to palpation, lower midline incision, hernia that is reducible. Hernia palpation is approximately 7-8 cm, nontender to palpation.  MSK: Normal gait, no clubbing cyanosis, edema  Skin: No rashes, palpation reveals normal skin turgor  Psychiatric: Appropriate judgment and insight, oriented to person, place, and time  Abdomen Inspection    Assessment & Plan Ralene Ok MD; 09/04/2018 10:16 AM) Fatima Blank HERNIA, WITHOUT OBSTRUCTION OR GANGRENE (K43.2) Impression: 53 year old female with a large incisional hernia, ovarian mass, gastric bezoar. We'll have the patient referred to [facility] for endoscopy secondary to bezoar in the gastric pouch. Would like to rule out gist versus possible mass.  1. We'll coordinate with Dr. Harrington Challenger in regards to hernia repair. Discussed that this would likely require robotic versus open hernia repair with mesh. This will likely require either retrorectus orTAR repair.  2. All risks and benefits were discussed with the patient to generally include, but not limited to: infection, bleeding, damage to surrounding structures, acute and chronic nerve pain, and recurrence. Alternatives were offered and described. All questions were answered and the patient voiced understanding of the procedure and wishes to proceed at this point with hernia repair.

## 2018-09-11 ENCOUNTER — Encounter: Payer: Self-pay | Admitting: General Surgery

## 2018-09-11 ENCOUNTER — Telehealth: Payer: Self-pay | Admitting: Gastroenterology

## 2018-09-11 NOTE — Telephone Encounter (Signed)
Pt returned call to prescreen.

## 2018-09-11 NOTE — Telephone Encounter (Signed)
Returned patients call and prescreened for 09/12/2018

## 2018-09-12 ENCOUNTER — Other Ambulatory Visit: Payer: Self-pay

## 2018-09-12 ENCOUNTER — Ambulatory Visit (INDEPENDENT_AMBULATORY_CARE_PROVIDER_SITE_OTHER): Payer: BLUE CROSS/BLUE SHIELD | Admitting: Gastroenterology

## 2018-09-12 ENCOUNTER — Encounter: Payer: Self-pay | Admitting: Gastroenterology

## 2018-09-12 VITALS — Ht 68.0 in | Wt 208.0 lb

## 2018-09-12 DIAGNOSIS — R112 Nausea with vomiting, unspecified: Secondary | ICD-10-CM

## 2018-09-12 DIAGNOSIS — R1013 Epigastric pain: Secondary | ICD-10-CM

## 2018-09-12 DIAGNOSIS — R933 Abnormal findings on diagnostic imaging of other parts of digestive tract: Secondary | ICD-10-CM | POA: Diagnosis not present

## 2018-09-12 DIAGNOSIS — Z9884 Bariatric surgery status: Secondary | ICD-10-CM

## 2018-09-12 NOTE — Progress Notes (Signed)
Virtual Visit via Video Note  I connected with Lisa Stanton on 09/12/18 at  2:30 PM EDT by a video enabled telemedicine application and verified that I am speaking with the correct person using two identifiers.   I discussed the limitations of evaluation and management by telemedicine and the availability of in person appointments. The patient expressed understanding and agreed to proceed.  THIS ENCOUNTER IS A VIRTUAL VISIT DUE TO COVID-19 - PATIENT WAS NOT SEEN IN THE OFFICE. PATIENT HAS CONSENTED TO VIRTUAL VISIT / TELEMEDICINE VISIT USING [employer] APP   Location of patient: home Location of provider: office Name of referring provider: Iran Planas PA-C Persons participating: myself, patient  HPI :  53 y/o female with a history of roux-en-y gastric bypass, GERD, ovarian mass, referred by Iran Planas for abnormal CT scan.   Patient was recently diagnosed with an ovarian mass on CT scan, associated with elevated CA 125 level concerning for malignancy or endometrioma per Dr. Denman George, who has plans for surgery to remove this at the end of the month, with Dr. Rosendo Gros doing a ventral hernia repair as well. 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   She has a history of a Roux-en-Y gastric bypass in 2000. She has some trouble eating at times, depending on what she eats and when she eats. She has vomiting once or twice per week, she does not think food is going through her stomach well, can happen 5 hours after she eats.  Occurs sporadically. She thinks ongoing for a long time. Patient denies any dysphagia. She does have some reflux / pyrosis which bothers her. Husband reports to the patient that her breath smells bad, even despite after she brushes her teeth. She does have some pain after she eats in her epigastric area. She does have some early satiety. She has been trying to lose weight intentionally through the [facility], she has been trying  to lose weight, 30 lbs over the past year. She takes omeprazole 20mg  / day, capsules.   She reports chronic constipation. No blood in the stools. She takes Miralax PRN. She is having 4 BMs per day.   She had a colonoscopy last year 03/2017 [facility] - normal, told not to come back in 10years.  No prior EGD.  Colon cancer in grandfather, no one else, no other GI tract malignancies.    CT scan 08/16/2018 - IMPRESSION: 1. Complex mixed cystic and solid 9.6 cm right adnexal mass with high risk imaging features, suspicious for primary right ovarian malignancy. 2. No lymphadenopathy or other findings of metastatic disease in the abdomen or pelvis. No ascites. 3. Status post Roux-en-Y gastric bypass surgery. Large 9.1 cm filling defect within the gastric pouch compatible with a bezoar. 4. Oral contrast in the lower thoracic esophagus suggesting gastroesophageal reflux and/or esophageal dysmotility. 5. Aortic Atherosclerosis (ICD10-I70.0).  CT scan 09/18/2016 - no acute changes    Past Medical History:  Diagnosis Date   Anemia    Arthritis    Back pain    Constipation    Dyspnea    Fatty liver    Gallbladder problem    GERD (gastroesophageal reflux disease)    Hip pain    Hyperlipidemia    Hypertension    Iron deficiency anemia    Joint pain    Knee pain    Lower leg edema    Murmur    Thyroid nodule  Vitamin B 12 deficiency      Past Surgical History:  Procedure Laterality Date   ABDOMINAL HERNIA REPAIR  09/2010   incarcerated that required surgery.     ABDOMINAL HYSTERECTOMY     ABDOMINOPLASTY  2006   APPENDECTOMY  February 2004   CARPAL TUNNEL RELEASE  May 1998   Right hand   CHOLECYSTECTOMY  February 2003   Cysts removed  September 1993   Left hand   HERNIA REPAIR     KNEE SURGERY  July 1986   Left knee   PARTIAL HYSTERECTOMY  November 2010   ROUX-EN-Y GASTRIC BYPASS  06/9474   UMBILICAL HERNIA REPAIR     Family  History  Problem Relation Age of Onset   Osteoporosis Mother    Rheum arthritis Mother    Kidney disease Mother    Thyroid disease Mother    Obesity Mother    Breast cancer Maternal Grandmother    Colon cancer Maternal Grandfather    Thyroid cancer Brother 78   Cancer Brother        thyroid   Diabetes Father    Hypertension Father    Hyperlipidemia Father    Cancer Father        pancreatic   Obesity Father    Diabetes Paternal Grandfather    Heart disease Paternal Grandfather    Cancer Sister        thyroid   Social History   Tobacco Use   Smoking status: Never Smoker   Smokeless tobacco: Never Used  Substance Use Topics   Alcohol use: Yes    Comment: 6 times/year   Drug use: No   Current Outpatient Medications  Medication Sig Dispense Refill   Azilsartan-Chlorthalidone (EDARBYCLOR) 40-12.5 MG TABS Take 1 tablet by mouth daily. 90 tablet 3   cetirizine (ZYRTEC) 10 MG tablet Take 10 mg by mouth daily.     docusate sodium (COLACE) 100 MG capsule Take 1 capsule (100 mg total) by mouth 2 (two) times daily. 60 capsule 0   fluticasone (FLONASE) 50 MCG/ACT nasal spray Place 2 sprays into both nostrils daily as needed for allergies or rhinitis.     ibuprofen (ADVIL,MOTRIN) 800 MG tablet Take 1 tablet (800 mg total) by mouth every 8 (eight) hours as needed for moderate pain. 30 tablet 0   Multiple Vitamin (MULTIVITAMIN) tablet Take 1 tablet by mouth daily.       omeprazole (PRILOSEC) 20 MG capsule Take 20 mg by mouth daily.     pravastatin (PRAVACHOL) 80 MG tablet Take 1 tablet (80 mg total) by mouth daily. 90 tablet 3   Vitamin D, Ergocalciferol, (DRISDOL) 1.25 MG (50000 UT) CAPS capsule Take 1 capsule (50,000 Units total) by mouth every 7 (seven) days. 4 capsule 0   No current facility-administered medications for this visit.    Allergies  Allergen Reactions   Benicar Hct [Olmesartan Medoxomil-Hctz] Shortness Of Breath   Lisinopril     cough    Singulair [Montelukast Sodium]     cough     Review of Systems: All systems reviewed and negative except where noted in HPI.    Ct Abdomen Pelvis W Contrast  Result Date: 08/16/2018 CLINICAL DATA:  Complex right ovarian mass. Elevated CA 125. Pelvic pain. EXAM: CT ABDOMEN AND PELVIS WITH CONTRAST TECHNIQUE: Multidetector CT imaging of the abdomen and pelvis was performed using the standard protocol following bolus administration of intravenous contrast. CONTRAST:  130mL OMNIPAQUE IOHEXOL 300 MG/ML  SOLN COMPARISON:  11/02/2010 pelvis  MRI. FINDINGS: Lower chest: No significant pulmonary nodules or acute consolidative airspace disease. Oral contrast in the lower thoracic esophagus. Hepatobiliary: Normal liver size. No liver mass. Cholecystectomy. No biliary ductal dilatation. Pancreas: Normal, with no mass or duct dilation. Spleen: Normal size. No mass. Adrenals/Urinary Tract: Normal adrenals. Subcentimeter hypodense renal cortical lesion in the posterior lower right kidney, too small to characterize, which requires no follow-up. Otherwise normal kidneys, with no hydronephrosis. Normal bladder. Stomach/Bowel: Status post gastric bypass surgery with intact appearing gastrojejunostomy and collapsed excluded distal stomach. There is a large mixed gas and soft tissue attenuation 9.1 x 6.5 cm filling defect within the gastric pouch (series 2/image 23), compatible with a bezoar. Normal caliber small bowel with no small bowel wall thickening. Appendectomy. Normal large bowel with no diverticulosis, large bowel wall thickening or pericolonic fat stranding. Vascular/Lymphatic: Atherosclerotic nonaneurysmal abdominal aorta. Patent portal, splenic, hepatic and renal veins. No pathologically enlarged lymph nodes in the abdomen or pelvis. Reproductive: Status post hysterectomy. There is a complex mixed cystic and solid 9.6 x 6.7 cm right adnexal mass (series 2/image 68), which demonstrates irregular wall thickening  and enhancement, thick internal enhanced septations and enhancing nodular solid component measuring 3.2 x 2.3 cm. No left adnexal mass. Other: No pneumoperitoneum, ascites or focal fluid collection. There is a moderate supraumbilical ventral abdominal hernia with broad hernia neck. No discrete peritoneal nodularity. Musculoskeletal: No aggressive appearing focal osseous lesions. Mild lumbar spondylosis. IMPRESSION: 1. Complex mixed cystic and solid 9.6 cm right adnexal mass with high risk imaging features, suspicious for primary right ovarian malignancy. 2. No lymphadenopathy or other findings of metastatic disease in the abdomen or pelvis. No ascites. 3. Status post Roux-en-Y gastric bypass surgery. Large 9.1 cm filling defect within the gastric pouch compatible with a bezoar. 4. Oral contrast in the lower thoracic esophagus suggesting gastroesophageal reflux and/or esophageal dysmotility. 5. Aortic Atherosclerosis (ICD10-I70.0). Electronically Signed   By: Ilona Sorrel M.D.   On: 08/16/2018 10:16    Physical Exam: Ht 5\' 8"  (1.727 m) Comment: pt provided over the phone   Wt 208 lb (94.3 kg) Comment: pt provided over the phone   LMP 03/15/2009    BMI 31.63 kg/m   NA   ASSESSMENT AND PLAN:  53 y/o female here for new patient visit regarding the following:  Abnormal CT scan abdomen / history of gastric bypass / epigastric pain / vomiting - CT scan during workup for ovarian mass shows incidentally noted suspected large gastric bezoar in the setting of Roux-en-Y anatomy. This is likely causing her upper tract symptoms. She has plans for removal of the ovarian mass as well as repair of the ventral hernia at the end of May, she is hoping to have this issue addressed before hand. EGD to evaluate this CT abnormality and remove the bezoar is indicated. We discussed what this would entail. Given the potential need to intubate the patient to protect her airway during removal of the bezoar, pending findings, this  procedure would be best done at the hospital for anesthesia support. I discussed risks / benefits of EGD and removal of the bezoar with the patient, and she wanted to proceed. I am going to discuss her case with Dr. Rush Landmark of advanced endoscopy, he may have open hospital procedure time to help accommodate this patient and help with her procedure, as not many elective cases are being done due to COVID-19 outbreak right now. I counseled her this may be technically challenging to remove and be a  prolonged procedure. We will discuss and get back to the patient in regards to timing of procedure. Of note, given gastric bypass status, she should crack open omeprazole capsule and ingest to maximize absorption of the drug. She agreed.   [place] Cellar, MD [facility]  CC: Donella Stade, PA-C

## 2018-09-12 NOTE — H&P (View-Only) (Signed)
Virtual Visit via Video Note  I connected with Lisa Stanton on 09/12/18 at  2:30 PM EDT by a video enabled telemedicine application and verified that I am speaking with the correct person using two identifiers.   I discussed the limitations of evaluation and management by telemedicine and the availability of in person appointments. The patient expressed understanding and agreed to proceed.  THIS ENCOUNTER IS A VIRTUAL VISIT DUE TO COVID-19 - PATIENT WAS NOT SEEN IN THE OFFICE. PATIENT HAS CONSENTED TO VIRTUAL VISIT / TELEMEDICINE VISIT USING DOXIMITY APP   Location of patient: home Location of provider: office Name of referring provider: Iran Planas PA-C Persons participating: myself, patient  HPI :  53 y/o female with a history of roux-en-y gastric bypass, GERD, ovarian mass, referred by Iran Planas for abnormal CT scan.   Patient was recently diagnosed with an ovarian mass on CT scan, associated with elevated CA 125 level concerning for malignancy or endometrioma per Dr. Denman George, who has plans for surgery to remove this at the end of the month, with Dr. Rosendo Gros doing a ventral hernia repair as well. 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   She has a history of a Roux-en-Y gastric bypass in 2000. She has some trouble eating at times, depending on what she eats and when she eats. She has vomiting once or twice per week, she does not think food is going through her stomach well, can happen 5 hours after she eats.  Occurs sporadically. She thinks ongoing for a long time. Patient denies any dysphagia. She does have some reflux / pyrosis which bothers her. Husband reports to the patient that her breath smells bad, even despite after she brushes her teeth. She does have some pain after she eats in her epigastric area. She does have some early satiety. She has been trying to lose weight intentionally through the [facility], she has been trying  to lose weight, 30 lbs over the past year. She takes omeprazole 20mg  / day, capsules.   She reports chronic constipation. No blood in the stools. She takes Miralax PRN. She is having 4 BMs per day.   She had a colonoscopy last year 03/2017 [facility] - normal, told not to come back in 10years.  No prior EGD.  Colon cancer in grandfather, no one else, no other GI tract malignancies.    CT scan 08/16/2018 - IMPRESSION: 1. Complex mixed cystic and solid 9.6 cm right adnexal mass with high risk imaging features, suspicious for primary right ovarian malignancy. 2. No lymphadenopathy or other findings of metastatic disease in the abdomen or pelvis. No ascites. 3. Status post Roux-en-Y gastric bypass surgery. Large 9.1 cm filling defect within the gastric pouch compatible with a bezoar. 4. Oral contrast in the lower thoracic esophagus suggesting gastroesophageal reflux and/or esophageal dysmotility. 5. Aortic Atherosclerosis (ICD10-I70.0).  CT scan 09/18/2016 - no acute changes    Past Medical History:  Diagnosis Date   Anemia    Arthritis    Back pain    Constipation    Dyspnea    Fatty liver    Gallbladder problem    GERD (gastroesophageal reflux disease)    Hip pain    Hyperlipidemia    Hypertension    Iron deficiency anemia    Joint pain    Knee pain    Lower leg edema    Murmur    Thyroid nodule  Vitamin B 12 deficiency      Past Surgical History:  Procedure Laterality Date   ABDOMINAL HERNIA REPAIR  09/2010   incarcerated that required surgery.     ABDOMINAL HYSTERECTOMY     ABDOMINOPLASTY  2006   APPENDECTOMY  February 2004   CARPAL TUNNEL RELEASE  May 1998   Right hand   CHOLECYSTECTOMY  February 2003   Cysts removed  September 1993   Left hand   HERNIA REPAIR     KNEE SURGERY  July 1986   Left knee   PARTIAL HYSTERECTOMY  November 2010   ROUX-EN-Y GASTRIC BYPASS  05/6107   UMBILICAL HERNIA REPAIR     Family  History  Problem Relation Age of Onset   Osteoporosis Mother    Rheum arthritis Mother    Kidney disease Mother    Thyroid disease Mother    Obesity Mother    Breast cancer Maternal Grandmother    Colon cancer Maternal Grandfather    Thyroid cancer Brother 54   Cancer Brother        thyroid   Diabetes Father    Hypertension Father    Hyperlipidemia Father    Cancer Father        pancreatic   Obesity Father    Diabetes Paternal Grandfather    Heart disease Paternal Grandfather    Cancer Sister        thyroid   Social History   Tobacco Use   Smoking status: Never Smoker   Smokeless tobacco: Never Used  Substance Use Topics   Alcohol use: Yes    Comment: 6 times/year   Drug use: No   Current Outpatient Medications  Medication Sig Dispense Refill   Azilsartan-Chlorthalidone (EDARBYCLOR) 40-12.5 MG TABS Take 1 tablet by mouth daily. 90 tablet 3   cetirizine (ZYRTEC) 10 MG tablet Take 10 mg by mouth daily.     docusate sodium (COLACE) 100 MG capsule Take 1 capsule (100 mg total) by mouth 2 (two) times daily. 60 capsule 0   fluticasone (FLONASE) 50 MCG/ACT nasal spray Place 2 sprays into both nostrils daily as needed for allergies or rhinitis.     ibuprofen (ADVIL,MOTRIN) 800 MG tablet Take 1 tablet (800 mg total) by mouth every 8 (eight) hours as needed for moderate pain. 30 tablet 0   Multiple Vitamin (MULTIVITAMIN) tablet Take 1 tablet by mouth daily.       omeprazole (PRILOSEC) 20 MG capsule Take 20 mg by mouth daily.     pravastatin (PRAVACHOL) 80 MG tablet Take 1 tablet (80 mg total) by mouth daily. 90 tablet 3   Vitamin D, Ergocalciferol, (DRISDOL) 1.25 MG (50000 UT) CAPS capsule Take 1 capsule (50,000 Units total) by mouth every 7 (seven) days. 4 capsule 0   No current facility-administered medications for this visit.    Allergies  Allergen Reactions   Benicar Hct [Olmesartan Medoxomil-Hctz] Shortness Of Breath   Lisinopril     cough    Singulair [Montelukast Sodium]     cough     Review of Systems: All systems reviewed and negative except where noted in HPI.    Ct Abdomen Pelvis W Contrast  Result Date: 08/16/2018 CLINICAL DATA:  Complex right ovarian mass. Elevated CA 125. Pelvic pain. EXAM: CT ABDOMEN AND PELVIS WITH CONTRAST TECHNIQUE: Multidetector CT imaging of the abdomen and pelvis was performed using the standard protocol following bolus administration of intravenous contrast. CONTRAST:  140mL OMNIPAQUE IOHEXOL 300 MG/ML  SOLN COMPARISON:  11/02/2010 pelvis  MRI. FINDINGS: Lower chest: No significant pulmonary nodules or acute consolidative airspace disease. Oral contrast in the lower thoracic esophagus. Hepatobiliary: Normal liver size. No liver mass. Cholecystectomy. No biliary ductal dilatation. Pancreas: Normal, with no mass or duct dilation. Spleen: Normal size. No mass. Adrenals/Urinary Tract: Normal adrenals. Subcentimeter hypodense renal cortical lesion in the posterior lower right kidney, too small to characterize, which requires no follow-up. Otherwise normal kidneys, with no hydronephrosis. Normal bladder. Stomach/Bowel: Status post gastric bypass surgery with intact appearing gastrojejunostomy and collapsed excluded distal stomach. There is a large mixed gas and soft tissue attenuation 9.1 x 6.5 cm filling defect within the gastric pouch (series 2/image 23), compatible with a bezoar. Normal caliber small bowel with no small bowel wall thickening. Appendectomy. Normal large bowel with no diverticulosis, large bowel wall thickening or pericolonic fat stranding. Vascular/Lymphatic: Atherosclerotic nonaneurysmal abdominal aorta. Patent portal, splenic, hepatic and renal veins. No pathologically enlarged lymph nodes in the abdomen or pelvis. Reproductive: Status post hysterectomy. There is a complex mixed cystic and solid 9.6 x 6.7 cm right adnexal mass (series 2/image 68), which demonstrates irregular wall thickening  and enhancement, thick internal enhanced septations and enhancing nodular solid component measuring 3.2 x 2.3 cm. No left adnexal mass. Other: No pneumoperitoneum, ascites or focal fluid collection. There is a moderate supraumbilical ventral abdominal hernia with broad hernia neck. No discrete peritoneal nodularity. Musculoskeletal: No aggressive appearing focal osseous lesions. Mild lumbar spondylosis. IMPRESSION: 1. Complex mixed cystic and solid 9.6 cm right adnexal mass with high risk imaging features, suspicious for primary right ovarian malignancy. 2. No lymphadenopathy or other findings of metastatic disease in the abdomen or pelvis. No ascites. 3. Status post Roux-en-Y gastric bypass surgery. Large 9.1 cm filling defect within the gastric pouch compatible with a bezoar. 4. Oral contrast in the lower thoracic esophagus suggesting gastroesophageal reflux and/or esophageal dysmotility. 5. Aortic Atherosclerosis (ICD10-I70.0). Electronically Signed   By: Ilona Sorrel M.D.   On: 08/16/2018 10:16    Physical Exam: Ht 5\' 8"  (1.727 m) Comment: pt provided over the phone   Wt 208 lb (94.3 kg) Comment: pt provided over the phone   LMP 03/15/2009    BMI 31.63 kg/m   NA   ASSESSMENT AND PLAN:  53 y/o female here for new patient visit regarding the following:  Abnormal CT scan abdomen / history of gastric bypass / epigastric pain / vomiting - CT scan during workup for ovarian mass shows incidentally noted suspected large gastric bezoar in the setting of Roux-en-Y anatomy. This is likely causing her upper tract symptoms. She has plans for removal of the ovarian mass as well as repair of the ventral hernia at the end of May, she is hoping to have this issue addressed before hand. EGD to evaluate this CT abnormality and remove the bezoar is indicated. We discussed what this would entail. Given the potential need to intubate the patient to protect her airway during removal of the bezoar, pending findings, this  procedure would be best done at the hospital for anesthesia support. I discussed risks / benefits of EGD and removal of the bezoar with the patient, and she wanted to proceed. I am going to discuss her case with Dr. Rush Landmark of advanced endoscopy, he may have open hospital procedure time to help accommodate this patient and help with her procedure, as not many elective cases are being done due to COVID-19 outbreak right now. I counseled her this may be technically challenging to remove and be a  prolonged procedure. We will discuss and get back to the patient in regards to timing of procedure. Of note, given gastric bypass status, she should crack open omeprazole capsule and ingest to maximize absorption of the drug. She agreed.   [place] Cellar, MD [facility]  CC: Donella Stade, PA-C

## 2018-09-14 ENCOUNTER — Other Ambulatory Visit (INDEPENDENT_AMBULATORY_CARE_PROVIDER_SITE_OTHER): Payer: Self-pay | Admitting: Physician Assistant

## 2018-09-14 DIAGNOSIS — E559 Vitamin D deficiency, unspecified: Secondary | ICD-10-CM

## 2018-09-16 ENCOUNTER — Encounter (INDEPENDENT_AMBULATORY_CARE_PROVIDER_SITE_OTHER): Payer: Self-pay | Admitting: Family Medicine

## 2018-09-18 ENCOUNTER — Telehealth: Payer: Self-pay

## 2018-09-18 ENCOUNTER — Other Ambulatory Visit: Payer: Self-pay

## 2018-09-18 ENCOUNTER — Telehealth: Payer: Self-pay | Admitting: Gastroenterology

## 2018-09-18 DIAGNOSIS — R1013 Epigastric pain: Secondary | ICD-10-CM

## 2018-09-18 NOTE — Telephone Encounter (Signed)
Patient scheduled at [facility] for EGD on 10/02/18 @7 :30am. Patient called with instructions and also sent by My Chart

## 2018-09-18 NOTE — Telephone Encounter (Signed)
g

## 2018-09-19 NOTE — Telephone Encounter (Signed)
Called patient with information about procedure. Details documented on another phone note

## 2018-09-23 ENCOUNTER — Telehealth: Payer: Self-pay

## 2018-09-23 IMAGING — US US THYROID BIOPSY
1 series · 13 of 14 positions shown · non-contrast
Comparison: Thyroid ultrasound performed 01/26/2016.

MEDICATIONS:
None

COMPLICATIONS:
None immediate.

INDICATION: Indeterminate left thyroid nodule

EXAM:
ULTRASOUND GUIDED FINE NEEDLE ASPIRATION OF INDETERMINATE THYROID
NODULE
TECHNIQUE: Informed written consent was obtained from the patient after a
discussion of the risks, benefits and alternatives to treatment.
Questions regarding the procedure were encouraged and answered. A
timeout was performed prior to the initiation of the procedure.

[Series 1: us thyroid biopsy · 0.08mm/px · 14 acquisitions, 13 frames shown]
[im 1/14]
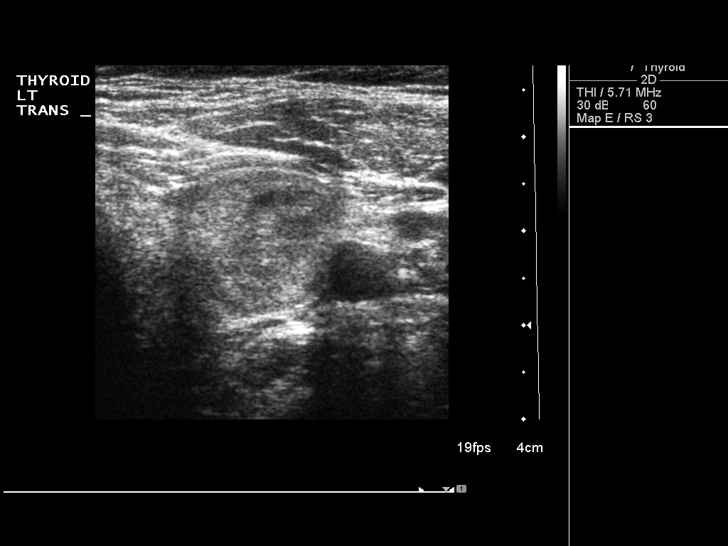
[im 2/14]
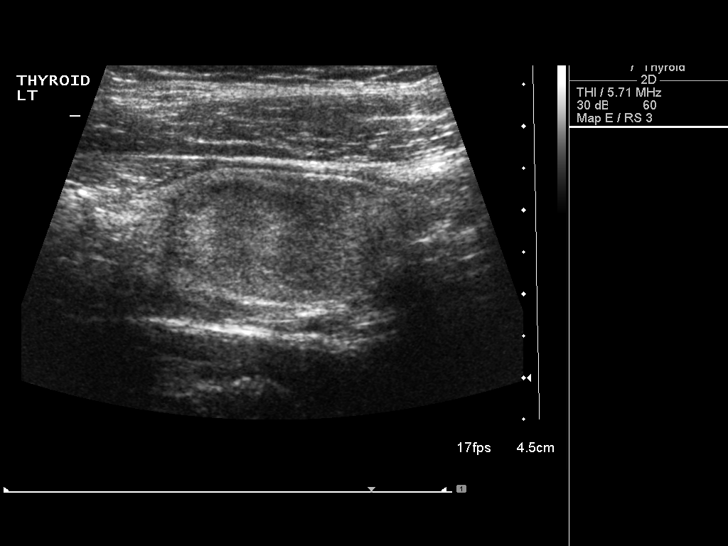
[im 3/14]
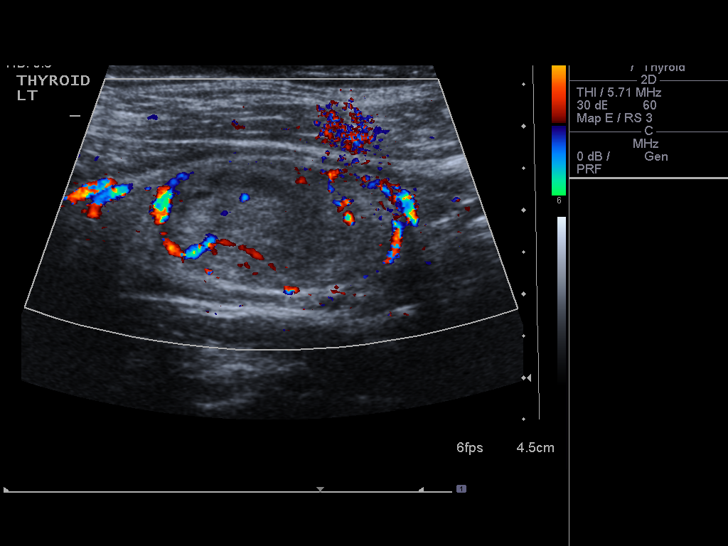
[im 4/14]
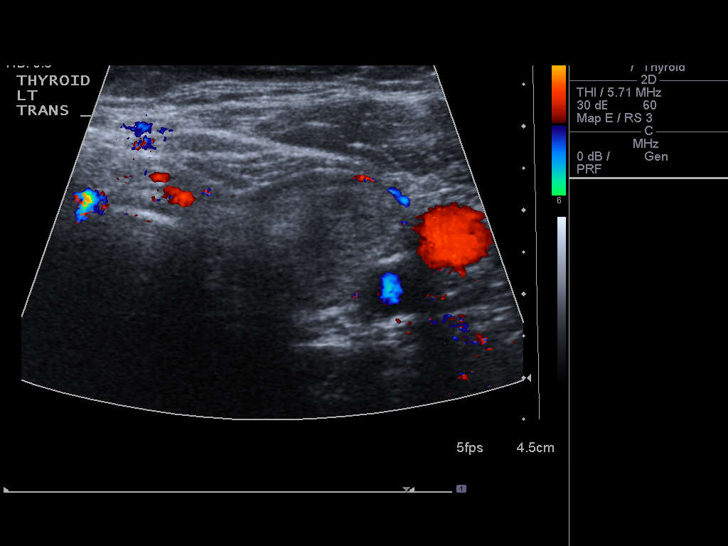
[im 5/14]
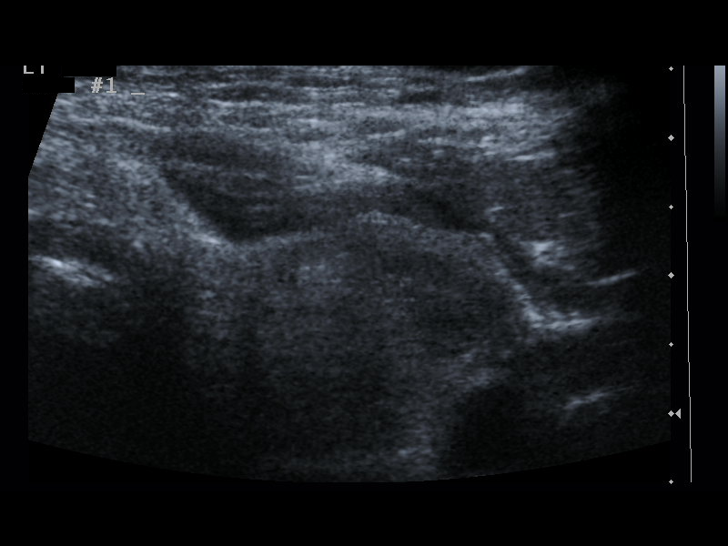
[im 6/14]
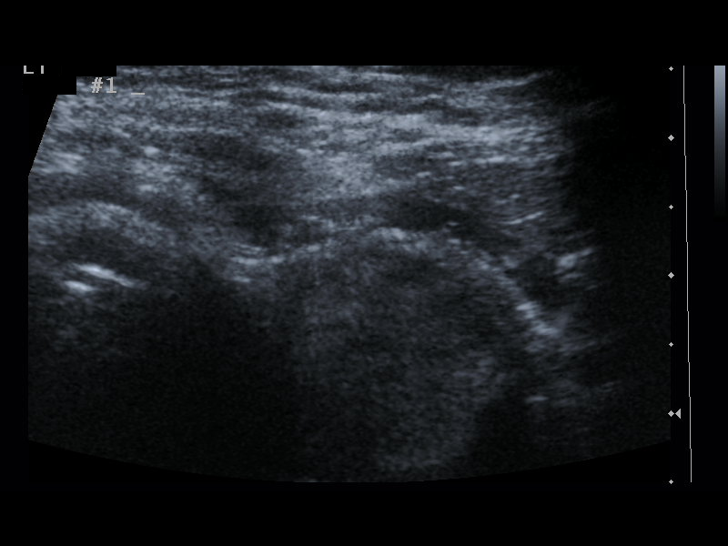
[im 8/14]
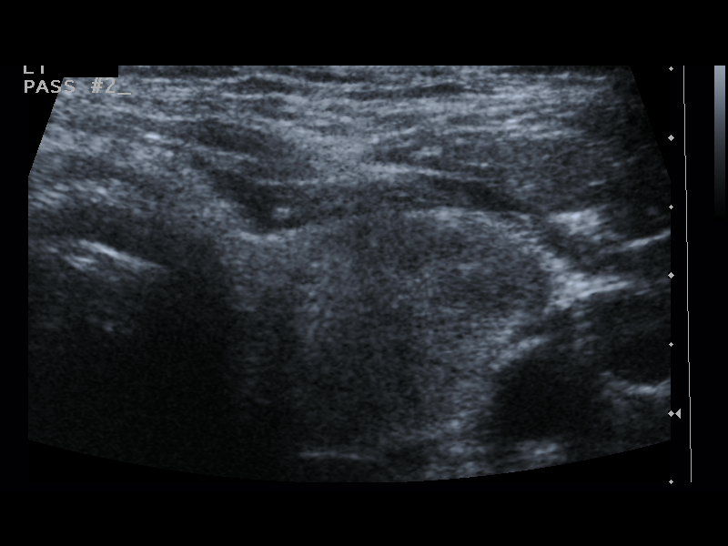
[im 9/14]
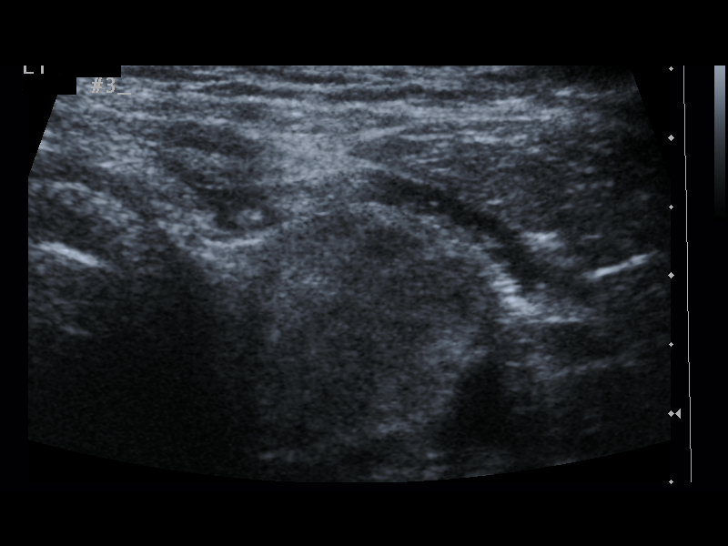
[im 10/14]
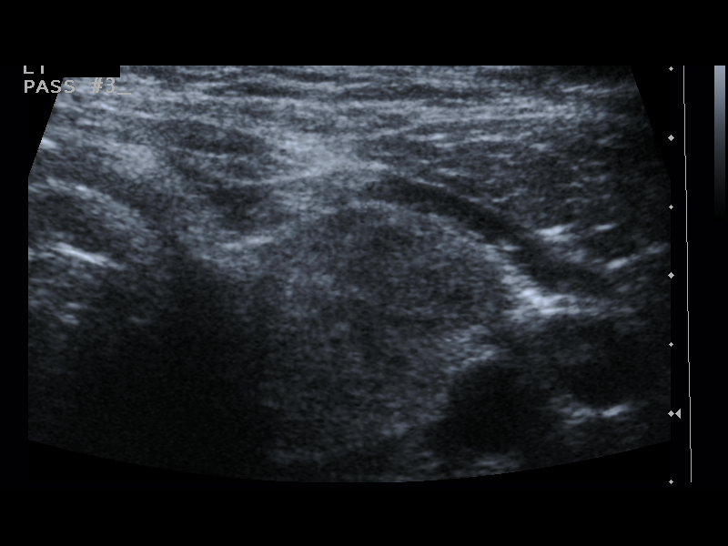
[im 11/14]
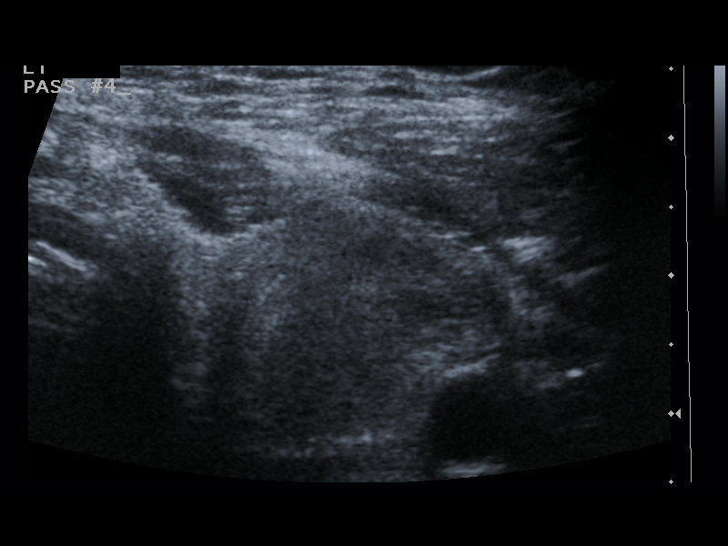
[im 12/14]
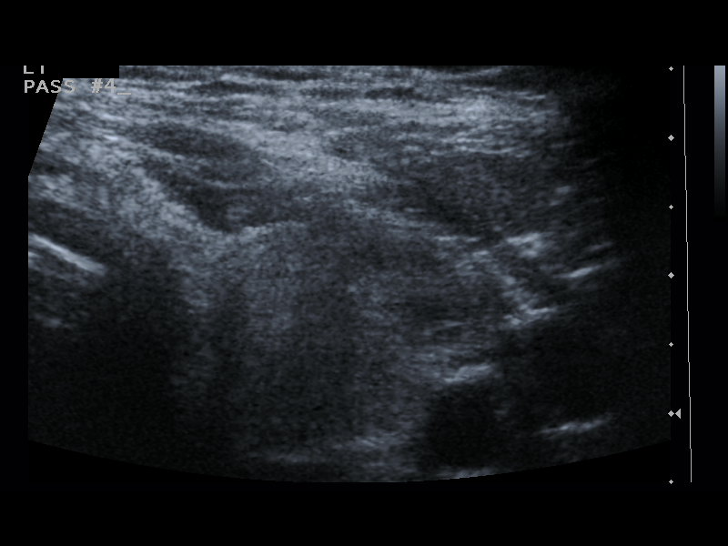
[im 13/14]
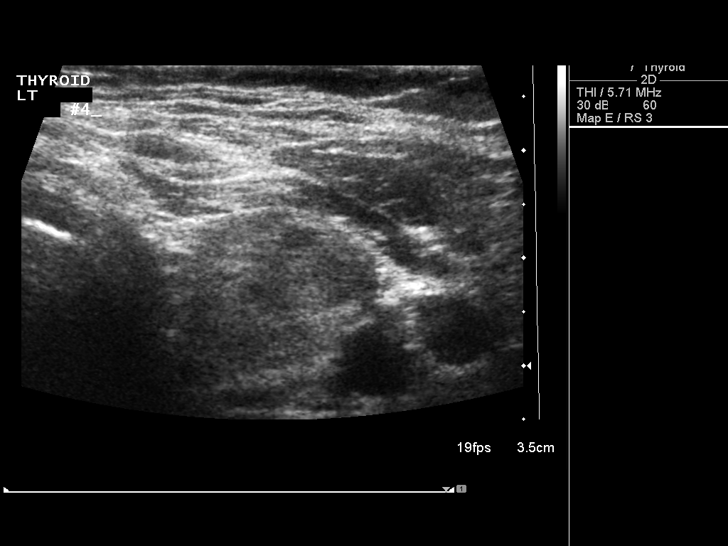
[im 14/14]
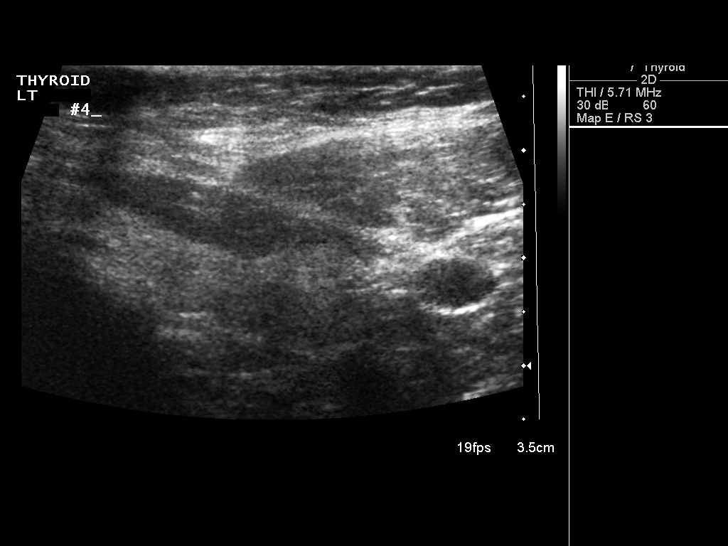

[13 of 14 positions shown; findings below may reference images not displayed]

Pre-procedural ultrasound scanning demonstrated a 2.9 x 1.6 x 1.7 cm
solid nodule within the left mid thyroid lobe.

The procedure was planned. The neck was prepped in the usual sterile
fashion, and a sterile drape was applied covering the operative
field. A timeout was performed prior to the initiation of the
procedure. Local anesthesia was provided with 1% lidocaine.

Under direct ultrasound guidance, 4 FNA biopsies were performed of
the left mid thyroid nodule with a 25 gauge needle. Multiple
ultrasound images were saved for procedural documentation purposes.
The samples were prepared and submitted to pathology.

Limited post procedural scanning was negative for hematoma or
additional complication. Dressings were placed. The patient
tolerated the above procedures procedure well without immediate
postprocedural complication.
FINDINGS: FINDINGS
Nodule reference number based on prior diagnostic ultrasound: 2

Maximum size: 2.9 cm

Location: Left  ;  Mid

ACR TI-RADS total points: 3

ACR TI-RADS risk category:  TR3

Prior biopsy:  No

Reason for biopsy: meets ACR TI-RADS criteria

Ultrasound imaging confirms appropriate placement of the needles
within the thyroid nodule.
IMPRESSION: Technically successful ultrasound guided fine needle aspiration of
left mid thyroid nodule as described above

## 2018-09-23 NOTE — Telephone Encounter (Signed)
Lm for pt to call back to discuss Covid-19 testing and her hospital procedure scheduled for 10-02-18.

## 2018-09-23 NOTE — Telephone Encounter (Signed)
Patient called back.  Informed her of the need to quarantine from Friday, 5-15 when she will be tested for COVID 19 until her procedure on Wednesday the 20th.  Pt expressed understanding.  I Gave her the Pre Admission Testing number of [phone] to call with more questions about the timing of her ability to get tested.

## 2018-09-25 ENCOUNTER — Telehealth: Payer: Self-pay

## 2018-09-25 NOTE — Telephone Encounter (Signed)
Spoke to Lisa Stanton and let her know that she needs to go for her Covid-19 test on Friday, 5-15 between 9:00am and 3:00pm at the [facility] drive through.  Lisa Stanton requested to go later than 3:00pm since she works and is already going to have to take off on Monday and Tuesday to quarantine before procedure.

## 2018-09-26 ENCOUNTER — Telehealth: Payer: Self-pay | Admitting: Gynecologic Oncology

## 2018-09-26 NOTE — Telephone Encounter (Signed)
Patient has recommendation for joint case with Dr. Denman George and Dr. Rosendo Gros for right ovarian mass, elevated CA 125, and hernia.  Patient has seen Dr. Rosendo Gros.  Called [facility] and left message for [place] with OR scheduling to inquire about getting the case posted.

## 2018-09-27 ENCOUNTER — Other Ambulatory Visit (HOSPITAL_COMMUNITY)
Admission: RE | Admit: 2018-09-27 | Discharge: 2018-09-27 | Disposition: A | Discharge status: Home / Self Care | Payer: BLUE CROSS/BLUE SHIELD | Source: Ambulatory Visit | Attending: Gastroenterology | Admitting: Gastroenterology

## 2018-09-27 ENCOUNTER — Telehealth: Payer: Self-pay | Admitting: Gynecologic Oncology

## 2018-09-27 ENCOUNTER — Other Ambulatory Visit: Payer: Self-pay

## 2018-09-27 DIAGNOSIS — Z1159 Encounter for screening for other viral diseases: Secondary | ICD-10-CM | POA: Diagnosis not present

## 2018-09-27 NOTE — Telephone Encounter (Signed)
Called patient to inform her of her surgery date of June 16 for her joint surgery with Dr. Denman George and Dr. Rosendo Gros.  Patient advised that she would be contacted by pre-surgical testing to arrange a pre-appointment and that our office would be reaching out for a pre-op appointment with myself to discuss the surgery, pre instructions, and post-op instructions.  No concerns voiced. Advised to call for any needs or concerns.

## 2018-09-28 LAB — NOVEL CORONAVIRUS, NAA (HOSP ORDER, SEND-OUT TO REF LAB; TAT 18-24 HRS): SARS-CoV-2, NAA: NOT DETECTED

## 2018-09-30 ENCOUNTER — Encounter (HOSPITAL_COMMUNITY): Payer: Self-pay | Admitting: *Deleted

## 2018-09-30 ENCOUNTER — Other Ambulatory Visit: Payer: Self-pay

## 2018-09-30 ENCOUNTER — Encounter (INDEPENDENT_AMBULATORY_CARE_PROVIDER_SITE_OTHER): Payer: Self-pay | Admitting: Family Medicine

## 2018-10-01 ENCOUNTER — Encounter (INDEPENDENT_AMBULATORY_CARE_PROVIDER_SITE_OTHER): Payer: Self-pay | Admitting: Family Medicine

## 2018-10-01 ENCOUNTER — Ambulatory Visit (INDEPENDENT_AMBULATORY_CARE_PROVIDER_SITE_OTHER): Payer: BLUE CROSS/BLUE SHIELD | Admitting: Family Medicine

## 2018-10-01 DIAGNOSIS — E559 Vitamin D deficiency, unspecified: Secondary | ICD-10-CM

## 2018-10-01 DIAGNOSIS — Z6837 Body mass index (BMI) 37.0-37.9, adult: Secondary | ICD-10-CM

## 2018-10-01 NOTE — Anesthesia Preprocedure Evaluation (Addendum)
Anesthesia Evaluation  Patient identified by MRN, date of birth, ID band Patient awake    Reviewed: Allergy & Precautions, NPO status , Patient's Chart, lab work & pertinent test results  Airway Mallampati: II  TM Distance: >3 FB Neck ROM: Full    Dental no notable dental hx. (+) Teeth Intact, Dental Advisory Given   Pulmonary    Pulmonary exam normal breath sounds clear to auscultation       Cardiovascular hypertension, Pt. on medications Normal cardiovascular exam Rhythm:Regular Rate:Normal  27 Jan 20 SR 65   Neuro/Psych negative neurological ROS  negative psych ROS   GI/Hepatic Neg liver ROS, GERD  ,  Endo/Other    Renal/GU    Hx of ovarian mass And elevated CA 125 levels  Scheduled for Surgery at end of month    Musculoskeletal  (+) Arthritis ,   Abdominal   Peds  Hematology  (+) anemia ,   Anesthesia Other Findings   Reproductive/Obstetrics                           Anesthesia Physical Anesthesia Plan  ASA: III  Anesthesia Plan: General   Post-op Pain Management:    Induction: Intravenous, Rapid sequence and Cricoid pressure planned  PONV Risk Score and Plan: Treatment may vary due to age or medical condition, Ondansetron and Dexamethasone  Airway Management Planned: Oral ETT  Additional Equipment:   Intra-op Plan:   Post-operative Plan: Extubation in OR  Informed Consent: I have reviewed the patients History and Physical, chart, labs and discussed the procedure including the risks, benefits and alternatives for the proposed anesthesia with the patient or authorized representative who has indicated his/her understanding and acceptance.     Dental advisory given  Plan Discussed with: CRNA  Anesthesia Plan Comments: (Pt w Hx of N/V for EGD under GA)      Anesthesia Quick Evaluation

## 2018-10-02 ENCOUNTER — Ambulatory Visit (HOSPITAL_COMMUNITY)
Admission: RE | Admit: 2018-10-02 | Discharge: 2018-10-02 | Disposition: A | Discharge status: Home / Self Care | Payer: BLUE CROSS/BLUE SHIELD | Attending: Gastroenterology | Admitting: Gastroenterology

## 2018-10-02 ENCOUNTER — Ambulatory Visit (HOSPITAL_COMMUNITY): Payer: BLUE CROSS/BLUE SHIELD | Admitting: Anesthesiology

## 2018-10-02 ENCOUNTER — Encounter (HOSPITAL_COMMUNITY): Payer: Self-pay

## 2018-10-02 ENCOUNTER — Other Ambulatory Visit: Payer: Self-pay

## 2018-10-02 ENCOUNTER — Encounter (HOSPITAL_COMMUNITY)
Admission: RE | Disposition: A | Discharge status: Home / Self Care | Payer: Self-pay | Source: Home / Self Care | Attending: Gastroenterology

## 2018-10-02 DIAGNOSIS — K5909 Other constipation: Secondary | ICD-10-CM | POA: Diagnosis not present

## 2018-10-02 DIAGNOSIS — E785 Hyperlipidemia, unspecified: Secondary | ICD-10-CM | POA: Diagnosis not present

## 2018-10-02 DIAGNOSIS — Z980 Intestinal bypass and anastomosis status: Secondary | ICD-10-CM | POA: Diagnosis not present

## 2018-10-02 DIAGNOSIS — R933 Abnormal findings on diagnostic imaging of other parts of digestive tract: Secondary | ICD-10-CM | POA: Insufficient documentation

## 2018-10-02 DIAGNOSIS — K2970 Gastritis, unspecified, without bleeding: Secondary | ICD-10-CM

## 2018-10-02 DIAGNOSIS — R935 Abnormal findings on diagnostic imaging of other abdominal regions, including retroperitoneum: Secondary | ICD-10-CM

## 2018-10-02 DIAGNOSIS — Z888 Allergy status to other drugs, medicaments and biological substances status: Secondary | ICD-10-CM | POA: Diagnosis not present

## 2018-10-02 DIAGNOSIS — K219 Gastro-esophageal reflux disease without esophagitis: Secondary | ICD-10-CM | POA: Diagnosis not present

## 2018-10-02 DIAGNOSIS — I10 Essential (primary) hypertension: Secondary | ICD-10-CM | POA: Insufficient documentation

## 2018-10-02 DIAGNOSIS — Z800 Family history of malignant neoplasm of digestive organs: Secondary | ICD-10-CM | POA: Diagnosis not present

## 2018-10-02 DIAGNOSIS — Z9884 Bariatric surgery status: Secondary | ICD-10-CM | POA: Insufficient documentation

## 2018-10-02 DIAGNOSIS — K439 Ventral hernia without obstruction or gangrene: Secondary | ICD-10-CM | POA: Diagnosis not present

## 2018-10-02 DIAGNOSIS — Z79899 Other long term (current) drug therapy: Secondary | ICD-10-CM | POA: Diagnosis not present

## 2018-10-02 DIAGNOSIS — R1013 Epigastric pain: Secondary | ICD-10-CM

## 2018-10-02 DIAGNOSIS — K2950 Unspecified chronic gastritis without bleeding: Secondary | ICD-10-CM | POA: Diagnosis not present

## 2018-10-02 HISTORY — PX: ESOPHAGOGASTRODUODENOSCOPY (EGD) WITH PROPOFOL: SHX5813

## 2018-10-02 HISTORY — DX: Other complications of anesthesia, initial encounter: T88.59XA

## 2018-10-02 HISTORY — PX: BIOPSY: SHX5522

## 2018-10-02 SURGERY — ESOPHAGOGASTRODUODENOSCOPY (EGD) WITH PROPOFOL
Anesthesia: General

## 2018-10-02 MED ORDER — SODIUM CHLORIDE 0.9 % IV SOLN: INTRAVENOUS | Status: DC

## 2018-10-02 MED ORDER — SUCCINYLCHOLINE CHLORIDE 200 MG/10ML IV SOSY
PREFILLED_SYRINGE | INTRAVENOUS | Status: DC | PRN
  Administered 2018-10-02: 100 mg via INTRAVENOUS

## 2018-10-02 MED ORDER — LACTATED RINGERS IV SOLN
INTRAVENOUS | Status: DC
  Administered 2018-10-02: 07:00:00 via INTRAVENOUS

## 2018-10-02 MED ORDER — ONDANSETRON HCL 4 MG/2ML IJ SOLN
INTRAMUSCULAR | Status: DC | PRN
  Administered 2018-10-02: 4 mg via INTRAVENOUS

## 2018-10-02 MED ORDER — LIDOCAINE 2% (20 MG/ML) 5 ML SYRINGE
INTRAMUSCULAR | Status: DC | PRN
  Administered 2018-10-02: 100 mg via INTRAVENOUS

## 2018-10-02 MED ORDER — PROPOFOL 10 MG/ML IV BOLUS
INTRAVENOUS | Status: AC
  Filled 2018-10-02: qty 40

## 2018-10-02 MED ORDER — PROPOFOL 10 MG/ML IV BOLUS
INTRAVENOUS | Status: DC | PRN
  Administered 2018-10-02: 200 mg via INTRAVENOUS

## 2018-10-02 MED ORDER — PROPOFOL 10 MG/ML IV BOLUS
INTRAVENOUS | Status: AC
  Filled 2018-10-02: qty 20

## 2018-10-02 SURGICAL SUPPLY — 14 items

## 2018-10-02 NOTE — Op Note (Signed)
[facility] Patient Name: Lisa Stanton Procedure Date: 10/02/2018 MRN: [id number] Attending MD: Carlota Raspberry. Havery Moros , MD Date of Birth: [birth year] CSN: [id number] Age: 53 Admit Type: Outpatient Procedure:                Upper GI endoscopy Indications:              Epigastric abdominal pain, Abnormal CT of the GI                            tract (CT shows 9.1cm gastric bezour within gastric                            pouch), history of Roux-en-Y gastric bypass Providers:                Remo Lipps P. Havery Moros, MD, Cleda Daub, RN, Tinnie Gens, Technician, Virgia Land, CRNA Referring MD:              Medicines:                Monitored Anesthesia Care Complications:            No immediate complications. Estimated blood loss:                            Minimal. Estimated Blood Loss:     Estimated blood loss was minimal. Procedure:                Pre-Anesthesia Assessment:                           - Prior to the procedure, a History and Physical                            was performed, and patient medications and                            allergies were reviewed. The patient's tolerance of                            previous anesthesia was also reviewed. The risks                            and benefits of the procedure and the sedation                            options and risks were discussed with the patient.                            All questions were answered, and informed consent                            was obtained. Prior Anticoagulants: The patient has  taken no previous anticoagulant or antiplatelet                            agents. ASA Grade Assessment: III - A patient with                            severe systemic disease. After reviewing the risks                            and benefits, the patient was deemed in                            satisfactory condition to undergo the procedure.                       After obtaining informed consent, the endoscope was                            passed under direct vision. Throughout the                            procedure, the patient's blood pressure, pulse, and                            oxygen saturations were monitored continuously. The                            [id number]-H190 ([id number] Olympus gastroscope was                            introduced through the mouth, and advanced to the                            jejunum. The upper GI endoscopy was accomplished                            without difficulty. The patient tolerated the                            procedure well. Scope In: Scope Out: Findings:      Esophagogastric landmarks were identified: the Z-line was found at 35       cm, the gastroesophageal junction was found at 35 cm and the upper       extent of the gastric folds was found at 35 cm from the incisors.      The exam of the esophagus was otherwise normal.      Evidence of a Roux-en-Y gastrojejunostomy was found. The gastrojejunal       anastomosis was characterized by healthy appearing mucosa. A portion of       a buried staple was noted along the anastomosis.      The exam of the stomach was otherwise normal. The gastric pouch appeared       normal. There was no evidence of a bezoar or retained food products in       the gastric pouch or anastmosis.  Biopsies were taken with a cold forceps in the gastric pouch for       Helicobacter pylori testing given some of the patient's upper tract       symptoms.      The examined blind limb and small bowel limb, which was deeply       intubated, was normal. There is an angulated turn to enter the small       bowel limb. Impression:               - Esophagogastric landmarks identified.                           - Normal esophagus                           - Roux-en-Y gastrojejunostomy with gastrojejunal                            anastomosis characterized by healthy  appearing                            mucosa.                           - Normal gastric pouch - no evidence of bezoar                            noted or retained food products. Biopsies taken to                            rule out H pylori in light of intermittent pain /                            vomiting                           - Normal examined small bowel limb, angulated turn                            entering the small bowel.                           Suspect prior CT scan findings represented ingested                            food that was delayed in emptying the gastric                            pouch. No obvious stenosis / stricture appreciated,                            anastomosis is healthy, no persistent bezoar or                            retained food. Moderate Sedation:      No moderate sedation, case performed with MAC Recommendation:           -  Patient has a contact number available for                            emergencies. The signs and symptoms of potential                            delayed complications were discussed with the                            patient. Return to normal activities tomorrow.                            Written discharge instructions were provided to the                            patient.                           - Resume previous diet.                           - Continue present medications.                           - Await pathology results. Procedure Code(s):        --- Professional ---                           [phone], Esophagogastroduodenoscopy, flexible,                            transoral; with biopsy, single or multiple Diagnosis Code(s):        --- Professional ---                           Z98.0, Intestinal bypass and anastomosis status                           R93.3, Abnormal findings on diagnostic imaging of                            other parts of digestive tract CPT copyright 2019 American [employer] Association.  All rights reserved. The codes documented in this report are preliminary and upon coder review may  be revised to meet current compliance requirements. Remo Lipps P. Armbruster, MD 10/02/2018 8:01:37 AM This report has been signed electronically. Number of Addenda: 0

## 2018-10-02 NOTE — Anesthesia Procedure Notes (Signed)
Procedure Name: Intubation Date/Time: 10/02/2018 7:38 AM Performed by: Maxwell Caul, CRNA Pre-anesthesia Checklist: Patient identified, Emergency Drugs available, Suction available and Patient being monitored Patient Re-evaluated:Patient Re-evaluated prior to induction Oxygen Delivery Method: Circle system utilized Preoxygenation: Pre-oxygenation with 100% oxygen Induction Type: IV induction, Rapid sequence and Cricoid Pressure applied Laryngoscope Size: Mac and 4 Grade View: Grade I Tube type: Oral Tube size: 7.5 mm Number of attempts: 1 Airway Equipment and Method: Stylet Placement Confirmation: ETT inserted through vocal cords under direct vision,  positive ETCO2 and breath sounds checked- equal and bilateral Secured at: 21 cm Tube secured with: Tape Dental Injury: Teeth and Oropharynx as per pre-operative assessment

## 2018-10-02 NOTE — Transfer of Care (Signed)
Immediate Anesthesia Transfer of Care Note  Patient: Lisa Stanton  Procedure(s) Performed: ESOPHAGOGASTRODUODENOSCOPY (EGD) WITH PROPOFOL (N/A ) BIOPSY  Patient Location: PACU  Anesthesia Type:General  Level of Consciousness: awake, alert  and oriented  Airway & Oxygen Therapy: Patient Spontanous Breathing and Patient connected to face mask oxygen  Post-op Assessment: Report given to RN and Post -op Vital signs reviewed and stable  Post vital signs: Reviewed and stable  Last Vitals:  Vitals Value Taken Time  BP    Temp    Pulse    Resp    SpO2      Last Pain:  Vitals:   10/02/18 0707  TempSrc: Oral  PainSc: 0-No pain         Complications: No apparent anesthesia complications

## 2018-10-02 NOTE — Progress Notes (Signed)
Office: [phone]  /  Fax: [phone] TeleHealth Visit:  Lisa Stanton has verbally consented to this TeleHealth visit today. The patient is located at home, the provider is located at the [employer] [facility] office. The participants in this visit include the listed provider and patient. The visit was conducted today via doxy.me.  HPI:   Chief Complaint: OBESITY Lisa Stanton is here to discuss her progress with her obesity treatment plan. She is on the  follow the Category 3 plan and is following her eating plan approximately 70 % of the time. She states she is exercising yardwork and walking the dog for 20 minutes 5 times per week. Lisa Stanton has done well maintaining her weight loss but has an EGD with biopsy and possible mass removal tomorrow and an oophorectomy for possible ovarian cancer in 3 weeks. She is concerned about gaining back the weight she lost.   We were unable to weigh the patient today for this TeleHealth visit. She feels as if she has lost weight since her last visit. She has lost 9 lbs since starting treatment with [name].  Vitamin D deficiency Lisa Stanton has a diagnosis of vitamin D deficiency. She is currently taking vit D and denies nausea, vomiting or muscle weakness. Her vitamin D level is now at goal. She would like to discontinue vitamin D and see if she can maintain without it.   ASSESSMENT AND PLAN:  Vitamin D deficiency  Class 2 severe obesity with serious comorbidity and body mass index (BMI) of 37.0 to 37.9 in adult, unspecified obesity type ([place])  PLAN: Vitamin D Deficiency Lisa Stanton was informed that low vitamin D levels contributes to fatigue and are associated with obesity, breast, and colon cancer. She agrees to hold vitamin D and will follow up for routine testing of vitamin D, at least 2-3 times per year. She was informed of the risk of over-replacement of vitamin D and agrees to not increase her dose unless she discusses this with [name] first. We  will recheck vitamin D level in 3 months. Agrees to follow up with our clinic as directed.   I spent > than 50% of the 25 minute visit on counseling as documented in the note.  Obesity Lisa Stanton is currently in the action stage of change. As such, her goal is to maintain weight for now She has agreed to portion control better and make smarter food choices, such as increase vegetables and decrease simple carbohydrates  Lisa Stanton has been instructed to work up to a goal of 150 minutes of combined cardio and strengthening exercise per week for weight loss and overall health benefits. We discussed the following Behavioral Modification Stratagies today: increasing lean protein intake, increasing vegetables, better snacking choices, and emotional eating strategies. She was advised to goal to maintain her weight for now and we discussed the importance of protein (even if just protein shakes) and antioxidant rich fruits and vegetables.   Lisa Stanton has agreed to follow up with our clinic PRN.  She was informed of the importance of frequent follow up visits to maximize her success with intensive lifestyle modifications for her multiple health conditions.  ALLERGIES: Allergies  Allergen Reactions  . Benicar Hct [Olmesartan Medoxomil-Hctz] Shortness Of Breath  . Lisinopril     cough  . Singulair [Montelukast Sodium]     cough    MEDICATIONS: Current Outpatient Medications on File Prior to Visit  Medication Sig Dispense Refill  . Azilsartan-Chlorthalidone (EDARBYCLOR) 40-12.5 MG TABS Take 1 tablet by mouth daily. [place]  tablet 3  . cetirizine (ZYRTEC) 10 MG tablet Take 10 mg by mouth at bedtime.     . cholecalciferol (VITAMIN D) 25 MCG (1000 UT) tablet Take 1,000 Units by mouth at bedtime.    . docusate sodium (COLACE) 100 MG capsule Take 1 capsule (100 mg total) by mouth 2 (two) times daily. (Patient taking differently: Take 100 mg by mouth at bedtime. ) 60 capsule 0  . fluticasone (FLONASE) 50  MCG/ACT nasal spray Place 2 sprays into both nostrils daily.     . Multiple Vitamin (MULTIVITAMIN WITH MINERALS) TABS tablet Take 1 tablet by mouth at bedtime.    [name] omeprazole (PRILOSEC) 20 MG capsule Take 20 mg by mouth daily before breakfast.     . polyethylene glycol powder (GLYCOLAX/MIRALAX) 17 GM/SCOOP powder Take 17 g by mouth daily as needed (constipation).    . pravastatin (PRAVACHOL) 80 MG tablet Take 1 tablet (80 mg total) by mouth daily. (Patient taking differently: Take 80 mg by mouth every evening. ) 90 tablet 3   No current facility-administered medications on file prior to visit.     PAST MEDICAL HISTORY: Past Medical History:  Diagnosis Date  . Anemia   . Arthritis   . Back pain   . Complication of anesthesia    woke up during 1086 left knee surgery  . Constipation   . Dyspnea   . Fatty liver   . Gallbladder problem   . GERD (gastroesophageal reflux disease)   . Hip pain   . Hyperlipidemia   . Hypertension   . Iron deficiency anemia   . Joint pain   . Knee pain   . Lower leg edema   . Murmur   . Thyroid nodule    right side  . Vitamin B 12 deficiency    resolved    PAST SURGICAL HISTORY: Past Surgical History:  Procedure Laterality Date  . ABDOMINAL HERNIA REPAIR  09/2010   incarcerated that required surgery.    . ABDOMINAL HYSTERECTOMY     partial  . ABDOMINOPLASTY  2006  . APPENDECTOMY  February 2004  . CARPAL TUNNEL RELEASE  May 1998   Right hand  . CHOLECYSTECTOMY  February 2003  . Cysts removed  September 1993   Left hand  . HERNIA REPAIR    . KNEE SURGERY  July 1986   Left knee  . PARTIAL HYSTERECTOMY  November 2010  . ROUX-EN-Y GASTRIC BYPASS  06/2000  . UMBILICAL HERNIA REPAIR      SOCIAL HISTORY: Social History   Tobacco Use  . Smoking status: Never Smoker  . Smokeless tobacco: Never Used  Substance Use Topics  . Alcohol use: Yes    Comment: 6 times/year  . Drug use: No    FAMILY HISTORY: Family History  Problem Relation  Age of Onset  . Osteoporosis Mother   . Rheum arthritis Mother   . Kidney disease Mother   . Thyroid disease Mother   . Obesity Mother   . Breast cancer Maternal Grandmother   . Colon cancer Maternal Grandfather   . Thyroid cancer Brother 17  . Cancer Brother        thyroid  . Diabetes Father   . Hypertension Father   . Hyperlipidemia Father   . Cancer Father        pancreatic  . Obesity Father   . Diabetes Paternal Grandfather   . Heart disease Paternal Grandfather   . Cancer Sister  thyroid    ROS: Review of Systems  Gastrointestinal: Negative for nausea and vomiting.  Musculoskeletal:       Negative for muscle weakness    PHYSICAL EXAM: Pt in no acute distress  RECENT LABS AND TESTS: BMET    Component Value Date/Time   NA 143 08/14/2018 1015   NA 139 06/10/2018 0956   K 3.8 08/14/2018 1015   CL 103 08/14/2018 1015   CL 97 06/26/2014   CO2 29 08/14/2018 1015   GLUCOSE 93 08/14/2018 1015   BUN 15 08/14/2018 1015   BUN 10 06/10/2018 0956   CREATININE 0.71 08/14/2018 1015   CREATININE 0.65 02/05/2018 0913   CALCIUM 9.5 08/14/2018 1015   CALCIUM 10.4 01/26/2015   CALCIUM 9.1 06/26/2014   GFRNONAA >60 08/14/2018 1015   GFRNONAA 103 02/05/2018 0913   GFRAA >60 08/14/2018 1015   GFRAA 119 02/05/2018 0913   Lab Results  Component Value Date   HGBA1C 5.8 (H) 06/10/2018   HGBA1C 6.1 (H) 08/22/2017   HGBA1C 5.9 (H) 02/12/2017   HGBA1C 6.1 (H) 01/25/2016   HGBA1C 6.4 02/23/2015   Lab Results  Component Value Date   INSULIN 24.1 06/10/2018   CBC    Component Value Date/Time   WBC 9.4 06/10/2018 0956   WBC 9.3 02/12/2017 0747   RBC 4.25 06/10/2018 0956   RBC 4.19 02/12/2017 0747   HGB 12.5 06/10/2018 0956   HCT 37.7 06/10/2018 0956   PLT 355 02/12/2017 0747   MCV 89 06/10/2018 0956   MCH 29.4 06/10/2018 0956   MCH 29.8 02/12/2017 0747   MCHC 33.2 06/10/2018 0956   MCHC 33.8 02/12/2017 0747   RDW 14.5 06/10/2018 0956   LYMPHSABS 3.4 (H)  06/10/2018 0956   MONOABS 522 01/25/2016 0919   EOSABS 0.1 06/10/2018 0956   BASOSABS 0.1 06/10/2018 0956   Iron/TIBC/Ferritin/ %Sat    Component Value Date/Time   IRON 96 06/10/2018 0956   TIBC 353 06/10/2018 0956   FERRITIN 62 06/10/2018 0956   IRONPCTSAT 27 06/10/2018 0956   Lipid Panel     Component Value Date/Time   CHOL 189 06/10/2018 0956   TRIG 139 06/10/2018 0956   HDL 53 06/10/2018 0956   CHOLHDL 4.0 02/05/2018 0913   VLDL 28 01/25/2016 0919   LDLCALC 108 (H) 06/10/2018 0956   LDLCALC 103 (H) 02/05/2018 0913   Hepatic Function Panel     Component Value Date/Time   PROT 6.8 06/10/2018 0956   ALBUMIN 4.3 06/10/2018 0956   AST 20 06/10/2018 0956   ALT 23 06/10/2018 0956   ALKPHOS 96 06/10/2018 0956   BILITOT 0.4 06/10/2018 0956   BILIDIR 0.1 01/28/2014 0914   IBILI 0.3 01/28/2014 0914      Component Value Date/Time   TSH 2.110 06/10/2018 0956   TSH 1.78 02/05/2018 0913   TSH 2.29 02/12/2017 0747     Ref. Range 06/10/2018 09:56  Vitamin D, 25-Hydroxy Latest Ref Range: 30.0 - 100.0 ng/mL 30.8     I, Lisa Stanton, am acting as Location manager for Dennard Nip, MD  I have reviewed the above documentation for accuracy and completeness, and I agree with the above. -Dennard Nip, MD

## 2018-10-02 NOTE — Interval H&P Note (Signed)
History and Physical Interval Note:  10/02/2018 7:17 AM  Lisa Stanton  has presented today for surgery, with the diagnosis of epigastric pain & vomiting.  The various methods of treatment have been discussed with the patient and family. After consideration of risks, benefits and other options for treatment, the patient has consented to  Procedure(s): ESOPHAGOGASTRODUODENOSCOPY (EGD) WITH PROPOFOL (N/A) as a surgical intervention.  The patient's history has been reviewed, patient examined, no change in status, stable for surgery.  I have reviewed the patient's chart and labs.  Questions were answered to the patient's satisfaction.     [place]

## 2018-10-02 NOTE — Anesthesia Postprocedure Evaluation (Signed)
Anesthesia Post Note  Patient: Emergency planning/management officer  Procedure(s) Performed: ESOPHAGOGASTRODUODENOSCOPY (EGD) WITH PROPOFOL (N/A ) BIOPSY     Patient location during evaluation: Endoscopy Anesthesia Type: General Level of consciousness: awake and alert Pain management: pain level controlled Vital Signs Assessment: post-procedure vital signs reviewed and stable Respiratory status: spontaneous breathing, nonlabored ventilation, respiratory function stable and patient connected to nasal cannula oxygen Cardiovascular status: blood pressure returned to baseline and stable Postop Assessment: no apparent nausea or vomiting Anesthetic complications: no    Last Vitals:  Vitals:   10/02/18 0820 10/02/18 0825  BP: (!) 145/101 (!) 149/87  Pulse: 74 76  Resp: 17 20  Temp:    SpO2: 92% 92%    Last Pain:  Vitals:   10/02/18 0803  TempSrc: Oral  PainSc: 0-No pain                 Barnet [place]

## 2018-10-04 ENCOUNTER — Encounter (HOSPITAL_COMMUNITY): Payer: Self-pay | Admitting: Gastroenterology

## 2018-10-10 ENCOUNTER — Telehealth: Payer: Self-pay | Admitting: *Deleted

## 2018-10-10 NOTE — Telephone Encounter (Signed)
Returned the patient's call and gave the fax number

## 2018-10-22 ENCOUNTER — Telehealth: Payer: Self-pay | Admitting: *Deleted

## 2018-10-22 NOTE — Telephone Encounter (Signed)
Patient called and left a message to call her back regarding per-op and hr COVID testing. Explained that they will call her this week.

## 2018-10-24 NOTE — Progress Notes (Signed)
EKG 06-10-18 EPIC

## 2018-10-24 NOTE — Patient Instructions (Addendum)
Shawndrea Gargis    DUE TO COVID-19 NO VISITORS ARE ALLOWED IN THE HOSPITAL AT THIS TIME.   YOU NEED TO HAVE A COVID 19 TEST ON TODAY @ 11:15AM, THIS TEST MUST BE DONE BEFORE SURGERY, COME TO [place] ENTRANCE.   MUST SELF QUARANTINE PER HANDOUT GIVEN.   Your procedure is scheduled on: 10-29-2018   Report to [facility] Main  Entrance    Report to admitting at 830  AM    Call this number if you have problems the morning of surgery [phone]    Remember: Do not eat food or drink liquids :After Midnight.    BRUSH YOUR TEETH MORNING OF SURGERY AND RINSE YOUR MOUTH OUT, NO CHEWING GUM CANDY OR MINTS.    Take these medicines the morning of surgery with A SIP OF WATER: OMEPRAZOLE (PRILOSEC)                             You may not have any metal on your body including piercings, jewelry, and  hair pins               Do not wear make-up, lotions, powders or perfumes, deodorant             Do not wear nail polish.    Do not shave  48 hours prior to surgery.            Do not bring valuables to the hospital. [place].   Contacts, dentures or bridgework may not be worn into surgery.     Patients discharged the day of surgery will not be allowed to drive home. IF YOU ARE HAVING SURGERY AND GOING HOME THE SAME DAY, YOU MUST HAVE AN ADULT TO DRIVE YOU HOME AND BE WITH YOU FOR 24 HOURS. YOU MAY GO HOME BY TAXI OR UBER OR ORTHERWISE, BUT AN ADULT MUST ACCOMPANY YOU HOME AND STAY WITH YOU FOR 24 HOURS.    Special Instructions: N/A              Please read over the following fact sheets you were given: _____________________________________________________________________        [facility] - Preparing for Surgery Before surgery, you can play an important role.  Because skin is not sterile, your skin needs to be as free of germs as possible.  You can reduce the number of germs on your skin  by washing with CHG (chlorahexidine gluconate) soap before surgery.  CHG is an antiseptic cleaner which kills germs and bonds with the skin to continue killing germs even after washing. Please DO NOT use if you have an allergy to CHG or antibacterial soaps.  If your skin becomes reddened/irritated stop using the CHG and inform your nurse when you arrive at Short Stay. Do not shave (including legs and underarms) for at least 48 hours prior to the first CHG shower.  You may shave your face/neck. Please follow these instructions carefully:  1.  Shower with CHG Soap the night before surgery and the  morning of Surgery.  2.  If you choose to wash your hair, wash your hair first as usual with your  normal  shampoo.  3.  After you shampoo, rinse your hair and body  thoroughly to remove the  shampoo.                             4.  Use CHG as you would any other liquid soap.  You can apply chg directly  to the skin and wash                       Gently with a scrungie or clean washcloth.  5.  Apply the CHG Soap to your body ONLY FROM THE NECK DOWN.   Do not use on face/ open                           Wound or open sores. Avoid contact with eyes, ears mouth and genitals (private parts).                       Wash face,  Genitals (private parts) with your normal soap.             6.  Wash thoroughly, paying special attention to the area where your surgery  will be performed.  7.  Thoroughly rinse your body with warm water from the neck down.  8.  DO NOT shower/wash with your normal soap after using and rinsing off  the CHG Soap.                9.  Pat yourself dry with a clean towel.            10.  Wear clean pajamas.            11.  Place clean sheets on your bed the night of your first shower and do not  sleep with pets. Day of Surgery : Do not apply any lotions/deodorants the morning of surgery.  Please wear clean clothes to the hospital/surgery center.  FAILURE TO FOLLOW THESE INSTRUCTIONS MAY RESULT  IN THE CANCELLATION OF YOUR SURGERY PATIENT SIGNATURE_________________________________  NURSE SIGNATURE__________________________________  ________________________________________________________________________

## 2018-10-25 ENCOUNTER — Other Ambulatory Visit: Payer: Self-pay

## 2018-10-25 ENCOUNTER — Other Ambulatory Visit (HOSPITAL_COMMUNITY)
Admission: RE | Admit: 2018-10-25 | Discharge: 2018-10-25 | Disposition: A | Discharge status: Home / Self Care | Payer: BC Managed Care – PPO | Source: Ambulatory Visit | Attending: Gynecologic Oncology | Admitting: Gynecologic Oncology

## 2018-10-25 ENCOUNTER — Inpatient Hospital Stay: Payer: BC Managed Care – PPO | Attending: Gynecologic Oncology | Admitting: Gynecologic Oncology

## 2018-10-25 ENCOUNTER — Encounter (HOSPITAL_COMMUNITY): Payer: Self-pay

## 2018-10-25 ENCOUNTER — Encounter (HOSPITAL_COMMUNITY)
Admission: RE | Admit: 2018-10-25 | Discharge: 2018-10-25 | Disposition: A | Discharge status: Home / Self Care | Payer: BC Managed Care – PPO | Source: Ambulatory Visit | Attending: Gynecologic Oncology | Admitting: Gynecologic Oncology

## 2018-10-25 VITALS — BP 115/68 | HR 68 | Temp 98.7°F | Resp 18 | Ht 68.0 in | Wt 203.2 lb

## 2018-10-25 DIAGNOSIS — R971 Elevated cancer antigen 125 [CA 125]: Secondary | ICD-10-CM | POA: Diagnosis not present

## 2018-10-25 DIAGNOSIS — Z01812 Encounter for preprocedural laboratory examination: Secondary | ICD-10-CM | POA: Diagnosis not present

## 2018-10-25 DIAGNOSIS — N83291 Other ovarian cyst, right side: Secondary | ICD-10-CM | POA: Insufficient documentation

## 2018-10-25 DIAGNOSIS — N838 Other noninflammatory disorders of ovary, fallopian tube and broad ligament: Secondary | ICD-10-CM

## 2018-10-25 HISTORY — DX: Nausea with vomiting, unspecified: R11.2

## 2018-10-25 HISTORY — DX: Personal history of other diseases of the nervous system and sense organs: Z86.69

## 2018-10-25 HISTORY — DX: Other specified postprocedural states: Z98.890

## 2018-10-25 HISTORY — DX: Personal history of other diseases of the digestive system: Z87.19

## 2018-10-25 HISTORY — DX: Pneumonia, unspecified organism: J18.9

## 2018-10-25 LAB — CBC
HCT: 40.4 % (ref 36.0–46.0)
Hemoglobin: 12.6 g/dL (ref 12.0–15.0)
MCH: 29.4 pg (ref 26.0–34.0)
MCHC: 31.2 g/dL (ref 30.0–36.0)
MCV: 94.4 fL (ref 80.0–100.0)
Platelets: 296 10*3/uL (ref 150–400)
RBC: 4.28 MIL/uL (ref 3.87–5.11)
RDW: 14.9 % (ref 11.5–15.5)
WBC: 8.3 10*3/uL (ref 4.0–10.5)
nRBC: 0 % (ref 0.0–0.2)

## 2018-10-25 LAB — COMPREHENSIVE METABOLIC PANEL
ALT: 30 U/L (ref 0–44)
AST: 23 U/L (ref 15–41)
Albumin: 4.2 g/dL (ref 3.5–5.0)
Alkaline Phosphatase: 98 U/L (ref 38–126)
Anion gap: 7 (ref 5–15)
BUN: 20 mg/dL (ref 6–20)
CO2: 27 mmol/L (ref 22–32)
Calcium: 9.4 mg/dL (ref 8.9–10.3)
Chloride: 105 mmol/L (ref 98–111)
Creatinine, Ser: 0.48 mg/dL (ref 0.44–1.00)
GFR calc Af Amer: >60 mL/min (ref >60)
GFR calc non Af Amer: >60 mL/min (ref >60)
Glucose, Bld: 78 mg/dL (ref 70–99)
Potassium: 3.6 mmol/L (ref 3.5–5.1)
Sodium: 139 mmol/L (ref 135–145)
Total Bilirubin: 0.3 mg/dL (ref 0.3–1.2)
Total Protein: 8.1 g/dL (ref 6.5–8.1)

## 2018-10-25 LAB — URINALYSIS, ROUTINE W REFLEX MICROSCOPIC
Bilirubin Urine: NEGATIVE
Glucose, UA: NEGATIVE mg/dL
Hgb urine dipstick: NEGATIVE
Ketones, ur: NEGATIVE mg/dL
Leukocytes,Ua: NEGATIVE
Nitrite: NEGATIVE
Protein, ur: NEGATIVE mg/dL
Specific Gravity, Urine: 1.006 (ref 1.005–1.030)
pH: 5 (ref 5.0–8.0)

## 2018-10-25 LAB — ABO/RH: ABO/RH(D): O POS

## 2018-10-25 MED ORDER — IBUPROFEN 600 MG PO TABS: 600.0000 mg | ORAL_TABLET | Freq: Four times a day (QID) | ORAL | 0 refills | Status: AC | PRN

## 2018-10-25 MED ORDER — OXYCODONE HCL 5 MG PO TABS: 5.0000 mg | ORAL_TABLET | ORAL | 0 refills | Status: DC | PRN

## 2018-10-25 MED ORDER — SENNOSIDES-DOCUSATE SODIUM 8.6-50 MG PO TABS: 2.0000 | ORAL_TABLET | Freq: Every day | ORAL | 0 refills | Status: DC

## 2018-10-25 NOTE — Patient Instructions (Signed)
Preparing for your Surgery  Plan for surgery on October 29, 2018 with Dr. Everitt Amber and Dr. Lyman Bishop at [facility] will be scheduled for a robotic assisted bilateral salpingo-oophorectomy, lysis of adhesions, possible staging, hernia repair with Dr. Rosendo Gros.   Pre-operative Testing -You will receive a phone call from presurgical testing at [facility] if you have not received a call already to arrange for a pre-operative testing appointment before your surgery.  This appointment normally occurs one to two weeks before your scheduled surgery (DONE).  -Bring your insurance card, copy of an advanced directive if applicable, medication list  -At that visit, you will be asked to sign a consent for a possible blood transfusion in case a transfusion becomes necessary during surgery.  The need for a blood transfusion is rare but having consent is a necessary part of your care.     -You should not be taking blood thinners or aspirin at least ten days prior to surgery unless instructed by your surgeon.  -Do not take supplements such as fish oil (omega 3), red yeast rice, tumeric before your surgery.  Day Before Surgery at [facility] DR. Rosendo Gros. You will be asked to take in a light diet the day before surgery.  Avoid carbonated beverages.  You will be advised to have nothing to eat or drink after midnight the evening before.    Eat a light diet the day before surgery.  Examples including soups, broths, toast, yogurt, mashed potatoes.  Things to avoid include carbonated beverages (fizzy beverages), raw fruits and raw vegetables, or beans.   If your bowels are filled with gas, your surgeon will have difficulty visualizing your pelvic organs which increases your surgical risks.  Your role in recovery Your role is to become active as soon as directed by your doctor, while still giving yourself time to heal.  Rest when you feel tired. You will be asked  to do the following in order to speed your recovery:  - Cough and breathe deeply. This helps toclear and expand your lungs and can prevent pneumonia. You may be given a spirometer to practice deep breathing. A staff member will show you how to use the spirometer. - Do mild physical activity. Walking or moving your legs help your circulation and body functions return to normal. A staff member will help you when you try to walk and will provide you with simple exercises. Do not try to get up or walk alone the first time. - Actively manage your pain. Managing your pain lets you move in comfort. We will ask you to rate your pain on a scale of zero to 10. It is your responsibility to tell your doctor or nurse where and how much you hurt so your pain can be treated.  Special Considerations -If you are diabetic, you may be placed on insulin after surgery to have closer control over your blood sugars to promote healing and recovery.  This does not mean that you will be discharged on insulin.  If applicable, your oral antidiabetics will be resumed when you are tolerating a solid diet.  -Your final pathology results from surgery should be available around one week after surgery and the results will be relayed to you when available.  -Dr. Lahoma Crocker is the surgeon that assists your GYN Oncologist with surgery.  If you end up staying the night, the next day after your surgery you will either see Dr. Denman George  or Dr. Lahoma Crocker.  -FMLA forms can be faxed to [phone] and please allow 5-7 business days for completion.  Pain Management After Surgery -You have been prescribed your pain medication and bowel regimen medications before surgery so that you can have these available when you are discharged from the hospital. The pain medication is for use ONLY AFTER surgery and a new prescription will not be given.   -Make sure that you have Tylenol and Ibuprofen at home to use on a regular basis after  surgery for pain control. We recommend alternating the medications every hour to six hours since they work differently and are processed in the body differently for pain relief.  -Review the attached handout on narcotic use and their risks and side effects.   Bowel Regimen -You have been prescribed Sennakot-S to take nightly to prevent constipation especially if you are taking the narcotic pain medication intermittently.  It is important to prevent constipation and drink adequate amounts of liquids.  Blood Transfusion Information WHAT IS A BLOOD TRANSFUSION? A transfusion is the replacement of blood or some of its parts. Blood is made up of multiple cells which provide different functions.  Red blood cells carry oxygen and are used for blood loss replacement.  White blood cells fight against infection.  Platelets control bleeding.  Plasma helps clot blood.  Other blood products are available for specialized needs, such as hemophilia or other clotting disorders. BEFORE THE TRANSFUSION  Who gives blood for transfusions?   You may be able to donate blood to be used at a later date on yourself (autologous donation).  Relatives can be asked to donate blood. This is generally not any safer than if you have received blood from a stranger. The same precautions are taken to ensure safety when a relative's blood is donated.  Healthy volunteers who are fully evaluated to make sure their blood is safe. This is blood bank blood. Transfusion therapy is the safest it has ever been in the practice of medicine. Before blood is taken from a donor, a complete history is taken to make sure that person has no history of diseases nor engages in risky social behavior (examples are intravenous drug use or sexual activity with multiple partners). The donor's travel history is screened to minimize risk of transmitting infections, such as malaria. The donated blood is tested for signs of infectious diseases, such  as HIV and hepatitis. The blood is then tested to be sure it is compatible with you in order to minimize the chance of a transfusion reaction. If you or a relative donates blood, this is often done in anticipation of surgery and is not appropriate for emergency situations. It takes many days to process the donated blood. RISKS AND COMPLICATIONS Although transfusion therapy is very safe and saves many lives, the main dangers of transfusion include:   Getting an infectious disease.  Developing a transfusion reaction. This is an allergic reaction to something in the blood you were given. Every precaution is taken to prevent this. The decision to have a blood transfusion has been considered carefully by your caregiver before blood is given. Blood is not given unless the benefits outweigh the risks.  AFTER SURGERY CONSIDERATIONS  10/25/2018   Return to work: 4-6 weeks if applicable  Activity: 1. Be up and out of the bed during the day.  Take a nap if needed.  You may walk up steps but be careful and use the hand rail.  Stair climbing will tire  you more than you think, you may need to stop part way and rest.   2. No lifting or straining for 6 weeks.  3. No driving for 1 week(s).  Do not drive if you are taking narcotic pain medicine.  4. Shower daily.  Use soap and water on your incision and pat dry; don't rub.  No tub baths until cleared by your surgeon.   5. No sexual activity and nothing in the vagina for 2 weeks.  6. You may experience a small amount of clear drainage from your incisions, which is normal.  If the drainage persists or increases, please call the office.  7. Take Tylenol or ibuprofen first for pain and only use narcotic pain medication for severe pain not relieved by the Tylenol or Ibuprofen.  Monitor your Tylenol intake to a max of 4,000 mg a day.  Diet: 1. Low sodium Heart Healthy Diet is recommended.  2. It is safe to use a laxative, such as Miralax or Colace, if you  have difficulty moving your bowels. You can take Sennakot at bedtime every evening to keep bowel movements regular and to prevent constipation.    Wound Care: 1. Keep clean and dry.  Shower daily.  Reasons to call the Doctor:  Fever - Oral temperature greater than 100.4 degrees Fahrenheit  Foul-smelling vaginal discharge  Difficulty urinating  Nausea and vomiting  Increased pain at the site of the incision that is unrelieved with pain medicine.  Difficulty breathing with or without chest pain  New calf pain especially if only on one side  Sudden, continuing increased vaginal bleeding with or without clots.

## 2018-10-25 NOTE — Progress Notes (Signed)
SPOKE W/  Wendi     SCREENING SYMPTOMS OF COVID 19:   COUGH--NO  RUNNY NOSE--- NO  SORE THROAT---NO  NASAL CONGESTION----NO  SNEEZING----NO  SHORTNESS OF BREATH---NO  DIFFICULTY BREATHING---NO  TEMP >100.0 -----NO  UNEXPLAINED BODY ACHES------NO  CHILLS -------- NO  HEADACHES ---------NO  LOSS OF SMELL/ TASTE --------NO    HAVE YOU OR ANY FAMILY MEMBER TRAVELLED PAST 14 DAYS OUT OF THE   COUNTY---[place] in [place]----NO COUNTRY----NO  HAVE YOU OR ANY FAMILY MEMBER BEEN EXPOSED TO ANYONE WITH COVID 19? NO

## 2018-10-26 ENCOUNTER — Other Ambulatory Visit (HOSPITAL_COMMUNITY)
Admission: RE | Admit: 2018-10-26 | Discharge: 2018-10-26 | Disposition: A | Discharge status: Home / Self Care | Payer: BC Managed Care – PPO | Source: Ambulatory Visit | Attending: Gynecologic Oncology | Admitting: Gynecologic Oncology

## 2018-10-26 DIAGNOSIS — Z1159 Encounter for screening for other viral diseases: Secondary | ICD-10-CM | POA: Diagnosis present

## 2018-10-28 ENCOUNTER — Encounter: Payer: Self-pay | Admitting: Gynecologic Oncology

## 2018-10-28 LAB — NOVEL CORONAVIRUS, NAA (HOSP ORDER, SEND-OUT TO REF LAB; TAT 18-24 HRS): SARS-CoV-2, NAA: NOT DETECTED

## 2018-10-28 NOTE — Progress Notes (Signed)
Patient here alone for a pre-operative appointment prior to her scheduled surgery on October 29, 2018. She is scheduled for a robotic assisted bilateral salpingo-oophorectomy, lysis of adhesions, possible staging, hernia repair with Dr. Rosendo Gros .  She has her pre-admission testing appointment this am at [facility] after our visit.  The surgery was discussed in detail.  See after visit summary for additional details. Visual aids used to discuss items related to surgery including the incentive spirometer, sequential compression stockings, foley catheter, IV pump, multi-modal pain regimen including tylenol, photo of the surgical robot, female reproductive system to discuss surgery in detail.      Discussed post-op pain management in detail including the aspects of the enhanced recovery pathway.  Advised her that a new prescription would be sent in for oxycodone and it is only to be used for after her upcoming surgery.  We discussed the use of tylenol post-op and to monitor for a maximum of 4,000 mg in a 24 hour period.  Also prescribed sennakot to be used after surgery and to hold if having loose stools.  Discussed bowel regimen in detail.     Discussed the use of lovenox pre-op, SCDs, and measures to take at home to prevent DVT including frequent mobility.  Reportable signs and symptoms of DVT discussed. Post-operative instructions discussed and expectations for after surgery. Incisional care discussed as well including reportable signs and symptoms including erythema, drainage, wound separation.     15 minutes spent with the patient.  Verbalizing understanding of material discussed. No needs or concerns voiced at the end of the visit.   Advised patient and family to call for any needs.  Advised that her post-operative medications had been prescribed and could be picked up at any time.

## 2018-10-28 NOTE — Progress Notes (Addendum)
SPOKE W/  [employer] via phone and reviewed the following questions:   [place] 19:  COUGH--no  RUNNY NOSE--- no SORE THROAT---no  NASAL CONGESTION----no  SNEEZING----seasonal allergies  SHORTNESS OF BREATH---no  DIFFICULTY BREATHING---no  TEMP >100.0 -----no  UNEXPLAINED BODY ACHES------no  CHILLS -------- no  HEADACHES ---------no  LOSS OF SMELL/ TASTE --------no    HAVE YOU OR ANY FAMILY MEMBER TRAVELLED PAST 14 DAYS OUT OF THE   COUNTY---no STATE----no COUNTRY----no  HAVE YOU OR ANY FAMILY MEMBER BEEN EXPOSED TO ANYONE WITH COVID 19?   no

## 2018-10-29 ENCOUNTER — Ambulatory Visit (HOSPITAL_COMMUNITY): Payer: BC Managed Care – PPO | Admitting: Physician Assistant

## 2018-10-29 ENCOUNTER — Other Ambulatory Visit: Payer: Self-pay

## 2018-10-29 ENCOUNTER — Encounter (HOSPITAL_COMMUNITY): Payer: Self-pay

## 2018-10-29 ENCOUNTER — Observation Stay (HOSPITAL_COMMUNITY)
Admission: RE | Admit: 2018-10-29 | Discharge: 2018-10-30 | Disposition: A | Discharge status: Home / Self Care | Payer: BC Managed Care – PPO | Attending: General Surgery | Admitting: General Surgery

## 2018-10-29 ENCOUNTER — Ambulatory Visit (HOSPITAL_COMMUNITY): Payer: BC Managed Care – PPO | Admitting: Certified Registered"

## 2018-10-29 ENCOUNTER — Encounter (HOSPITAL_COMMUNITY)
Admission: RE | Disposition: A | Discharge status: Home / Self Care | Payer: Self-pay | Source: Home / Self Care | Attending: General Surgery

## 2018-10-29 DIAGNOSIS — N839 Noninflammatory disorder of ovary, fallopian tube and broad ligament, unspecified: Secondary | ICD-10-CM | POA: Insufficient documentation

## 2018-10-29 DIAGNOSIS — X58XXXA Exposure to other specified factors, initial encounter: Secondary | ICD-10-CM | POA: Diagnosis not present

## 2018-10-29 DIAGNOSIS — N83201 Unspecified ovarian cyst, right side: Secondary | ICD-10-CM | POA: Diagnosis not present

## 2018-10-29 DIAGNOSIS — T182XXA Foreign body in stomach, initial encounter: Secondary | ICD-10-CM | POA: Diagnosis not present

## 2018-10-29 DIAGNOSIS — Z8719 Personal history of other diseases of the digestive system: Secondary | ICD-10-CM

## 2018-10-29 DIAGNOSIS — Z9889 Other specified postprocedural states: Secondary | ICD-10-CM | POA: Insufficient documentation

## 2018-10-29 DIAGNOSIS — E7800 Pure hypercholesterolemia, unspecified: Secondary | ICD-10-CM | POA: Diagnosis not present

## 2018-10-29 DIAGNOSIS — I10 Essential (primary) hypertension: Secondary | ICD-10-CM | POA: Diagnosis not present

## 2018-10-29 DIAGNOSIS — N801 Endometriosis of ovary: Secondary | ICD-10-CM | POA: Diagnosis not present

## 2018-10-29 DIAGNOSIS — K432 Incisional hernia without obstruction or gangrene: Secondary | ICD-10-CM | POA: Diagnosis not present

## 2018-10-29 DIAGNOSIS — Z5331 Laparoscopic surgical procedure converted to open procedure: Secondary | ICD-10-CM | POA: Diagnosis not present

## 2018-10-29 DIAGNOSIS — N736 Female pelvic peritoneal adhesions (postinfective): Secondary | ICD-10-CM | POA: Diagnosis not present

## 2018-10-29 DIAGNOSIS — E6609 Other obesity due to excess calories: Secondary | ICD-10-CM

## 2018-10-29 DIAGNOSIS — R971 Elevated cancer antigen 125 [CA 125]: Secondary | ICD-10-CM

## 2018-10-29 DIAGNOSIS — Z9884 Bariatric surgery status: Secondary | ICD-10-CM | POA: Diagnosis not present

## 2018-10-29 DIAGNOSIS — K219 Gastro-esophageal reflux disease without esophagitis: Secondary | ICD-10-CM | POA: Insufficient documentation

## 2018-10-29 DIAGNOSIS — Z79899 Other long term (current) drug therapy: Secondary | ICD-10-CM | POA: Diagnosis not present

## 2018-10-29 DIAGNOSIS — E669 Obesity, unspecified: Secondary | ICD-10-CM | POA: Diagnosis not present

## 2018-10-29 DIAGNOSIS — Z6835 Body mass index (BMI) 35.0-35.9, adult: Secondary | ICD-10-CM | POA: Diagnosis not present

## 2018-10-29 DIAGNOSIS — Z6837 Body mass index (BMI) 37.0-37.9, adult: Secondary | ICD-10-CM

## 2018-10-29 DIAGNOSIS — N838 Other noninflammatory disorders of ovary, fallopian tube and broad ligament: Secondary | ICD-10-CM

## 2018-10-29 HISTORY — PX: LAPAROSCOPIC LYSIS OF ADHESIONS: SHX5905

## 2018-10-29 HISTORY — PX: XI ROBOTIC ASSISTED INGUINAL HERNIA REPAIR WITH MESH: SHX6706

## 2018-10-29 HISTORY — PX: ROBOTIC ASSISTED SALPINGO OOPHERECTOMY: SHX6082

## 2018-10-29 LAB — TYPE AND SCREEN
ABO/RH(D): O POS
Antibody Screen: NEGATIVE

## 2018-10-29 SURGERY — SALPINGO-OOPHORECTOMY, ROBOT-ASSISTED
Anesthesia: General

## 2018-10-29 MED ORDER — EVICEL 5 ML EX KIT
PACK | Freq: Once | CUTANEOUS | Status: AC
  Administered 2018-10-29: 5 mL
  Filled 2018-10-29: qty 1

## 2018-10-29 MED ORDER — ONDANSETRON HCL 4 MG/2ML IJ SOLN: 4.0000 mg | Freq: Four times a day (QID) | INTRAMUSCULAR | Status: DC | PRN

## 2018-10-29 MED ORDER — CHLORHEXIDINE GLUCONATE CLOTH 2 % EX PADS: 6.0000 | MEDICATED_PAD | Freq: Once | CUTANEOUS | Status: DC

## 2018-10-29 MED ORDER — FLUTICASONE PROPIONATE 50 MCG/ACT NA SUSP
2.0000 | Freq: Every day | NASAL | Status: DC | PRN
  Filled 2018-10-29: qty 16

## 2018-10-29 MED ORDER — ROCURONIUM BROMIDE 10 MG/ML (PF) SYRINGE
PREFILLED_SYRINGE | INTRAVENOUS | Status: AC
  Filled 2018-10-29: qty 10

## 2018-10-29 MED ORDER — SUCCINYLCHOLINE CHLORIDE 200 MG/10ML IV SOSY
PREFILLED_SYRINGE | INTRAVENOUS | Status: AC
  Filled 2018-10-29: qty 10

## 2018-10-29 MED ORDER — ACETAMINOPHEN 500 MG PO TABS
1000.0000 mg | ORAL_TABLET | ORAL | Status: DC
  Filled 2018-10-29: qty 2

## 2018-10-29 MED ORDER — LACTATED RINGERS IV SOLN: INTRAVENOUS | Status: DC

## 2018-10-29 MED ORDER — SODIUM CHLORIDE 0.9 % IV SOLN
INTRAVENOUS | Status: DC | PRN
  Administered 2018-10-29: 25 ug/min via INTRAVENOUS
  Administered 2018-10-29: 15 ug/min via INTRAVENOUS

## 2018-10-29 MED ORDER — PHENYLEPHRINE HCL (PRESSORS) 10 MG/ML IV SOLN
INTRAVENOUS | Status: AC
  Filled 2018-10-29: qty 1

## 2018-10-29 MED ORDER — KETAMINE HCL 10 MG/ML IJ SOLN
INTRAMUSCULAR | Status: DC | PRN
  Administered 2018-10-29: 10 mg via INTRAVENOUS
  Administered 2018-10-29: 15 mg via INTRAVENOUS

## 2018-10-29 MED ORDER — SODIUM CHLORIDE 0.9 % IV SOLN
2.0000 g | INTRAVENOUS | Status: AC
  Filled 2018-10-29: qty 2

## 2018-10-29 MED ORDER — BUPIVACAINE LIPOSOME 1.3 % IJ SUSP
20.0000 mL | Freq: Once | INTRAMUSCULAR | Status: AC
  Administered 2018-10-29: 20 mL
  Filled 2018-10-29: qty 20

## 2018-10-29 MED ORDER — STERILE WATER FOR IRRIGATION IR SOLN
Status: DC | PRN
  Administered 2018-10-29: 1000 mL

## 2018-10-29 MED ORDER — SCOPOLAMINE 1 MG/3DAYS TD PT72
MEDICATED_PATCH | TRANSDERMAL | Status: AC
  Filled 2018-10-29: qty 1

## 2018-10-29 MED ORDER — CEFAZOLIN SODIUM-DEXTROSE 2-3 GM-%(50ML) IV SOLR
INTRAVENOUS | Status: DC | PRN
  Administered 2018-10-29: 2 g via INTRAVENOUS

## 2018-10-29 MED ORDER — ONDANSETRON 4 MG PO TBDP: 4.0000 mg | ORAL_TABLET | Freq: Four times a day (QID) | ORAL | Status: DC | PRN

## 2018-10-29 MED ORDER — FENTANYL CITRATE (PF) 100 MCG/2ML IJ SOLN
INTRAMUSCULAR | Status: AC
  Filled 2018-10-29: qty 2

## 2018-10-29 MED ORDER — SUGAMMADEX SODIUM 200 MG/2ML IV SOLN
INTRAVENOUS | Status: DC | PRN
  Administered 2018-10-29: 180 mg via INTRAVENOUS

## 2018-10-29 MED ORDER — LIDOCAINE 2% (20 MG/ML) 5 ML SYRINGE
INTRAMUSCULAR | Status: AC
  Filled 2018-10-29: qty 5

## 2018-10-29 MED ORDER — BUPIVACAINE HCL (PF) 0.25 % IJ SOLN
INTRAMUSCULAR | Status: AC
  Filled 2018-10-29: qty 30

## 2018-10-29 MED ORDER — HYDROMORPHONE HCL 1 MG/ML IJ SOLN
1.0000 mg | INTRAMUSCULAR | Status: DC | PRN
  Administered 2018-10-29 – 2018-10-30 (×2): 1 mg via INTRAVENOUS
  Filled 2018-10-29 (×2): qty 1

## 2018-10-29 MED ORDER — ENOXAPARIN SODIUM 40 MG/0.4ML ~~LOC~~ SOLN
40.0000 mg | SUBCUTANEOUS | Status: DC
  Administered 2018-10-30: 40 mg via SUBCUTANEOUS
  Filled 2018-10-29: qty 0.4

## 2018-10-29 MED ORDER — LIDOCAINE 2% (20 MG/ML) 5 ML SYRINGE
INTRAMUSCULAR | Status: DC | PRN
  Administered 2018-10-29: 50 mg via INTRAVENOUS

## 2018-10-29 MED ORDER — EPHEDRINE SULFATE-NACL 50-0.9 MG/10ML-% IV SOSY
PREFILLED_SYRINGE | INTRAVENOUS | Status: DC | PRN
  Administered 2018-10-29: 10 mg via INTRAVENOUS
  Administered 2018-10-29: 5 mg via INTRAVENOUS
  Administered 2018-10-29: 10 mg via INTRAVENOUS

## 2018-10-29 MED ORDER — GABAPENTIN 300 MG PO CAPS
300.0000 mg | ORAL_CAPSULE | Freq: Two times a day (BID) | ORAL | Status: DC
  Administered 2018-10-29 – 2018-10-30 (×2): 300 mg via ORAL
  Filled 2018-10-29 (×2): qty 1

## 2018-10-29 MED ORDER — CEFAZOLIN SODIUM-DEXTROSE 2-4 GM/100ML-% IV SOLN
INTRAVENOUS | Status: AC
  Filled 2018-10-29: qty 100

## 2018-10-29 MED ORDER — MIDAZOLAM HCL 2 MG/2ML IJ SOLN
INTRAMUSCULAR | Status: AC
  Filled 2018-10-29: qty 2

## 2018-10-29 MED ORDER — PROPOFOL 10 MG/ML IV BOLUS
INTRAVENOUS | Status: DC | PRN
  Administered 2018-10-29: 200 mg via INTRAVENOUS

## 2018-10-29 MED ORDER — ONDANSETRON HCL 4 MG/2ML IJ SOLN
INTRAMUSCULAR | Status: AC
  Filled 2018-10-29: qty 2

## 2018-10-29 MED ORDER — PANTOPRAZOLE SODIUM 40 MG IV SOLR
40.0000 mg | Freq: Every day | INTRAVENOUS | Status: DC
  Administered 2018-10-29: 40 mg via INTRAVENOUS
  Filled 2018-10-29: qty 40

## 2018-10-29 MED ORDER — SUGAMMADEX SODIUM 200 MG/2ML IV SOLN
INTRAVENOUS | Status: AC
  Filled 2018-10-29: qty 2

## 2018-10-29 MED ORDER — CELECOXIB 200 MG PO CAPS
400.0000 mg | ORAL_CAPSULE | ORAL | Status: AC
  Administered 2018-10-29: 400 mg via ORAL
  Filled 2018-10-29: qty 2

## 2018-10-29 MED ORDER — SODIUM CHLORIDE (PF) 0.9 % IJ SOLN
INTRAMUSCULAR | Status: DC | PRN
  Administered 2018-10-29: 10 mL

## 2018-10-29 MED ORDER — EPHEDRINE 5 MG/ML INJ
INTRAVENOUS | Status: AC
  Filled 2018-10-29: qty 10

## 2018-10-29 MED ORDER — PROPOFOL 10 MG/ML IV BOLUS
INTRAVENOUS | Status: AC
  Filled 2018-10-29: qty 20

## 2018-10-29 MED ORDER — 0.9 % SODIUM CHLORIDE (POUR BTL) OPTIME
TOPICAL | Status: DC | PRN
  Administered 2018-10-29: 1000 mL

## 2018-10-29 MED ORDER — METOCLOPRAMIDE HCL 5 MG/ML IJ SOLN: 10.0000 mg | Freq: Once | INTRAMUSCULAR | Status: DC | PRN

## 2018-10-29 MED ORDER — ROCURONIUM BROMIDE 10 MG/ML (PF) SYRINGE
PREFILLED_SYRINGE | INTRAVENOUS | Status: DC | PRN
  Administered 2018-10-29: 50 mg via INTRAVENOUS
  Administered 2018-10-29: 20 mg via INTRAVENOUS
  Administered 2018-10-29 (×2): 10 mg via INTRAVENOUS
  Administered 2018-10-29: 20 mg via INTRAVENOUS
  Administered 2018-10-29 (×2): 10 mg via INTRAVENOUS
  Administered 2018-10-29: 5 mg via INTRAVENOUS

## 2018-10-29 MED ORDER — FENTANYL CITRATE (PF) 250 MCG/5ML IJ SOLN
INTRAMUSCULAR | Status: DC | PRN
  Administered 2018-10-29 (×2): 50 ug via INTRAVENOUS

## 2018-10-29 MED ORDER — LACTATED RINGERS IV SOLN
INTRAVENOUS | Status: DC
  Administered 2018-10-29 (×2): via INTRAVENOUS

## 2018-10-29 MED ORDER — CELECOXIB 200 MG PO CAPS: 200.0000 mg | ORAL_CAPSULE | ORAL | Status: DC

## 2018-10-29 MED ORDER — ACETAMINOPHEN 500 MG PO TABS
1000.0000 mg | ORAL_TABLET | ORAL | Status: AC
  Administered 2018-10-29: 1000 mg via ORAL

## 2018-10-29 MED ORDER — DEXAMETHASONE SODIUM PHOSPHATE 10 MG/ML IJ SOLN
INTRAMUSCULAR | Status: DC | PRN
  Administered 2018-10-29: 4 mg via INTRAVENOUS

## 2018-10-29 MED ORDER — MIDAZOLAM HCL 2 MG/2ML IJ SOLN
INTRAMUSCULAR | Status: DC | PRN
  Administered 2018-10-29: 2 mg via INTRAVENOUS

## 2018-10-29 MED ORDER — SODIUM CHLORIDE 0.9 % IV SOLN
INTRAVENOUS | Status: DC
  Administered 2018-10-29: 18:00:00 via INTRAVENOUS

## 2018-10-29 MED ORDER — GABAPENTIN 300 MG PO CAPS
300.0000 mg | ORAL_CAPSULE | ORAL | Status: AC
  Administered 2018-10-29: 300 mg via ORAL
  Filled 2018-10-29: qty 1

## 2018-10-29 MED ORDER — OXYCODONE HCL 5 MG PO TABS
5.0000 mg | ORAL_TABLET | ORAL | Status: DC | PRN
  Administered 2018-10-30 (×2): 5 mg via ORAL
  Filled 2018-10-29 (×2): qty 1

## 2018-10-29 MED ORDER — MEPERIDINE HCL 50 MG/ML IJ SOLN: 6.2500 mg | INTRAMUSCULAR | Status: DC | PRN

## 2018-10-29 MED ORDER — SUCCINYLCHOLINE CHLORIDE 200 MG/10ML IV SOSY
PREFILLED_SYRINGE | INTRAVENOUS | Status: DC | PRN
  Administered 2018-10-29: 100 mg via INTRAVENOUS

## 2018-10-29 MED ORDER — FENTANYL CITRATE (PF) 100 MCG/2ML IJ SOLN
25.0000 ug | INTRAMUSCULAR | Status: DC | PRN
  Administered 2018-10-29: 50 ug via INTRAVENOUS

## 2018-10-29 MED ORDER — ADULT MULTIVITAMIN W/MINERALS CH
1.0000 | ORAL_TABLET | Freq: Every day | ORAL | Status: DC
  Administered 2018-10-29: 1 via ORAL
  Filled 2018-10-29: qty 1

## 2018-10-29 MED ORDER — GABAPENTIN 300 MG PO CAPS: 300.0000 mg | ORAL_CAPSULE | ORAL | Status: DC

## 2018-10-29 MED ORDER — LACTATED RINGERS IR SOLN
Status: DC | PRN
  Administered 2018-10-29: 1000 mL

## 2018-10-29 MED ORDER — SODIUM CHLORIDE (PF) 0.9 % IJ SOLN
INTRAMUSCULAR | Status: AC
  Filled 2018-10-29: qty 10

## 2018-10-29 MED ORDER — ENOXAPARIN SODIUM 40 MG/0.4ML ~~LOC~~ SOLN
40.0000 mg | SUBCUTANEOUS | Status: AC
  Administered 2018-10-29: 40 mg via SUBCUTANEOUS
  Filled 2018-10-29: qty 0.4

## 2018-10-29 MED ORDER — DEXAMETHASONE SODIUM PHOSPHATE 10 MG/ML IJ SOLN
INTRAMUSCULAR | Status: AC
  Filled 2018-10-29: qty 1

## 2018-10-29 MED ORDER — AZILSARTAN-CHLORTHALIDONE 40-12.5 MG PO TABS: 1.0000 | ORAL_TABLET | Freq: Every day | ORAL | Status: DC

## 2018-10-29 MED ORDER — CELECOXIB 200 MG PO CAPS
200.0000 mg | ORAL_CAPSULE | Freq: Two times a day (BID) | ORAL | Status: DC
  Administered 2018-10-29 – 2018-10-30 (×2): 200 mg via ORAL
  Filled 2018-10-29 (×2): qty 1

## 2018-10-29 MED ORDER — IBUPROFEN 200 MG PO TABS
600.0000 mg | ORAL_TABLET | Freq: Four times a day (QID) | ORAL | Status: DC | PRN
  Administered 2018-10-29: 600 mg via ORAL
  Filled 2018-10-29: qty 3

## 2018-10-29 MED ORDER — BUPIVACAINE HCL (PF) 0.25 % IJ SOLN
INTRAMUSCULAR | Status: DC | PRN
  Administered 2018-10-29: 30 mL

## 2018-10-29 MED ORDER — LIDOCAINE 2% (20 MG/ML) 5 ML SYRINGE
INTRAMUSCULAR | Status: DC | PRN
  Administered 2018-10-29: 1 mg/kg/h via INTRAVENOUS

## 2018-10-29 MED ORDER — SODIUM CHLORIDE 0.9 % IV SOLN
2.0000 g | INTRAVENOUS | Status: AC
  Administered 2018-10-29 (×2): 2 g via INTRAVENOUS
  Filled 2018-10-29: qty 2

## 2018-10-29 MED ORDER — PHENYLEPHRINE 40 MCG/ML (10ML) SYRINGE FOR IV PUSH (FOR BLOOD PRESSURE SUPPORT)
PREFILLED_SYRINGE | INTRAVENOUS | Status: DC | PRN
  Administered 2018-10-29 (×3): 120 ug via INTRAVENOUS

## 2018-10-29 MED ORDER — ONDANSETRON HCL 4 MG/2ML IJ SOLN
INTRAMUSCULAR | Status: DC | PRN
  Administered 2018-10-29 (×2): 4 mg via INTRAVENOUS

## 2018-10-29 MED ORDER — LIDOCAINE HCL 2 % IJ SOLN
INTRAMUSCULAR | Status: AC
  Filled 2018-10-29: qty 20

## 2018-10-29 MED ORDER — SCOPOLAMINE 1 MG/3DAYS TD PT72
MEDICATED_PATCH | TRANSDERMAL | Status: DC | PRN
  Administered 2018-10-29: 1 via TRANSDERMAL

## 2018-10-29 MED ORDER — PHENYLEPHRINE 40 MCG/ML (10ML) SYRINGE FOR IV PUSH (FOR BLOOD PRESSURE SUPPORT)
PREFILLED_SYRINGE | INTRAVENOUS | Status: AC
  Filled 2018-10-29: qty 10

## 2018-10-29 SURGICAL SUPPLY — 108 items
ADH SKN CLS APL DERMABOND .7 (GAUZE/BANDAGES/DRESSINGS) ×2
AGENT HMST KT MTR STRL THRMB (HEMOSTASIS)
APL ESCP 34 STRL LF DISP (HEMOSTASIS) ×2
APL PRP STRL LF DISP 70% ISPRP (MISCELLANEOUS) ×2
APPLICATOR SURGIFLO ENDO (HEMOSTASIS) ×1 IMPLANT
BAG LAPAROSCOPIC 12 15 PORT 16 (BASKET) IMPLANT
BAG RETRIEVAL 12/15 (BASKET)
BAG SPEC RTRVL LRG 6X4 10 (ENDOMECHANICALS)
BAG URINE DRAINAGE (UROLOGICAL SUPPLIES) IMPLANT
BINDER ABDOMINAL 12 ML 46-62 (SOFTGOODS) ×1 IMPLANT
BLADE 10 SAFETY STRL DISP (BLADE) ×1 IMPLANT
BLADE 11 SAFETY STRL DISP (BLADE) ×1 IMPLANT
BLADE SURG SZ11 CARB STEEL (BLADE) ×2 IMPLANT
CATH FOLEY 3WAY 30CC 16FR (CATHETERS) IMPLANT
CHLORAPREP W/TINT 26 (MISCELLANEOUS) ×3 IMPLANT
COVER BACK TABLE 60X90IN (DRAPES) ×3 IMPLANT
COVER SURGICAL LIGHT HANDLE (MISCELLANEOUS) ×2 IMPLANT
COVER TIP SHEARS 8 DVNC (MISCELLANEOUS) ×4 IMPLANT
COVER TIP SHEARS 8MM DA VINCI (MISCELLANEOUS) ×1
COVER WAND RF STERILE (DRAPES) IMPLANT
DECANTER SPIKE VIAL GLASS SM (MISCELLANEOUS) ×3 IMPLANT
DERMABOND ADVANCED (GAUZE/BANDAGES/DRESSINGS) ×1
DERMABOND ADVANCED .7 DNX12 (GAUZE/BANDAGES/DRESSINGS) ×4 IMPLANT
DEVICE TROCAR PUNCTURE CLOSURE (ENDOMECHANICALS) ×1 IMPLANT
DRAIN CHANNEL 19F RND (DRAIN) ×1 IMPLANT
DRAIN CHANNEL RND F F (WOUND CARE) IMPLANT
DRAPE ARM DVNC X/XI (DISPOSABLE) ×16 IMPLANT
DRAPE COLUMN DVNC XI (DISPOSABLE) ×4 IMPLANT
DRAPE DA VINCI XI ARM (DISPOSABLE) ×4
DRAPE DA VINCI XI COLUMN (DISPOSABLE) ×1
DRAPE SHEET LG 3/4 BI-LAMINATE (DRAPES) ×3 IMPLANT
DRAPE SURG IRRIG POUCH 19X23 (DRAPES) ×3 IMPLANT
DRSG OPSITE POSTOP 4X8 (GAUZE/BANDAGES/DRESSINGS) ×1 IMPLANT
DRSG TEGADERM 2-3/8X2-3/4 SM (GAUZE/BANDAGES/DRESSINGS) ×4 IMPLANT
DRSG TEGADERM 4X4.75 (GAUZE/BANDAGES/DRESSINGS) ×1 IMPLANT
ELECT BLADE TIP CTD 4 INCH (ELECTRODE) ×1 IMPLANT
ELECT REM PT RETURN 15FT ADLT (MISCELLANEOUS) ×5 IMPLANT
GAUZE 4X4 16PLY RFD (DISPOSABLE) IMPLANT
GAUZE SPONGE 2X2 8PLY STRL LF (GAUZE/BANDAGES/DRESSINGS) IMPLANT
GAUZE SPONGE 4X4 16PLY XRAY LF (GAUZE/BANDAGES/DRESSINGS) ×1 IMPLANT
GLOVE BIO SURGEON STRL SZ 6 (GLOVE) ×12 IMPLANT
GLOVE BIO SURGEON STRL SZ 6.5 (GLOVE) ×2 IMPLANT
GLOVE BIO SURGEON STRL SZ7.5 (GLOVE) ×5 IMPLANT
GOWN STRL REUS W/ TWL LRG LVL3 (GOWN DISPOSABLE) ×4 IMPLANT
GOWN STRL REUS W/TWL 2XL LVL3 (GOWN DISPOSABLE) ×1 IMPLANT
GOWN STRL REUS W/TWL LRG LVL3 (GOWN DISPOSABLE) ×6
GOWN STRL REUS W/TWL XL LVL3 (GOWN DISPOSABLE) ×7 IMPLANT
HANDLE SUCTION POOLE (INSTRUMENTS) IMPLANT
HOLDER FOLEY CATH W/STRAP (MISCELLANEOUS) ×2 IMPLANT
IRRIG SUCT STRYKERFLOW 2 WTIP (MISCELLANEOUS) ×3
IRRIGATION SUCT STRKRFLW 2 WTP (MISCELLANEOUS) ×4 IMPLANT
KIT BASIN OR (CUSTOM PROCEDURE TRAY) ×2 IMPLANT
KIT PROCEDURE DA VINCI SI (MISCELLANEOUS)
KIT PROCEDURE DVNC SI (MISCELLANEOUS) IMPLANT
KIT TURNOVER KIT A (KITS) IMPLANT
MANIPULATOR UTERINE 4.5 ZUMI (MISCELLANEOUS) ×2 IMPLANT
MARKER SKIN DUAL TIP RULER LAB (MISCELLANEOUS) ×1 IMPLANT
MESH PROLENE PML 12X12 (Mesh General) ×1 IMPLANT
NDL INSUFFLATION 14GA 120MM (NEEDLE) ×2 IMPLANT
NDL SPNL 18GX3.5 QUINCKE PK (NEEDLE) IMPLANT
NEEDLE HYPO 22GX1.5 SAFETY (NEEDLE) ×3 IMPLANT
NEEDLE INSUFFLATION 14GA 120MM (NEEDLE) ×3 IMPLANT
NEEDLE SPNL 18GX3.5 QUINCKE PK (NEEDLE) IMPLANT
OBTURATOR OPTICAL STANDARD 8MM (TROCAR) ×1
OBTURATOR OPTICAL STND 8 DVNC (TROCAR) ×2
OBTURATOR OPTICALSTD 8 DVNC (TROCAR) ×2 IMPLANT
PACK CARDIOVASCULAR III (CUSTOM PROCEDURE TRAY) ×2 IMPLANT
PACK ROBOT GYN CUSTOM WL (TRAY / TRAY PROCEDURE) ×3 IMPLANT
PAD POSITIONING PINK XL (MISCELLANEOUS) ×3 IMPLANT
PENCIL ELECTRO RS 15 CORD (MISCELLANEOUS) ×1 IMPLANT
PORT ACCESS TROCAR AIRSEAL 12 (TROCAR) ×2 IMPLANT
PORT ACCESS TROCAR AIRSEAL 5M (TROCAR)
POUCH SPECIMEN RETRIEVAL 10MM (ENDOMECHANICALS) IMPLANT
Prolene Mesh PML ×1 IMPLANT
SCISSORS LAP 5X35 DISP (ENDOMECHANICALS) ×4 IMPLANT
SEAL CANN UNIV 5-8 DVNC XI (MISCELLANEOUS) ×14 IMPLANT
SEAL XI 5MM-8MM UNIVERSAL (MISCELLANEOUS) ×4
SET BI-LUMEN FLTR TB AIRSEAL (TUBING) ×2 IMPLANT
SET IRRIG Y TYPE TUR BLADDER L (SET/KITS/TRAYS/PACK) IMPLANT
SET TRI-LUMEN FLTR TB AIRSEAL (TUBING) ×2 IMPLANT
SLEEVE XCEL OPT CAN 5 100 (ENDOMECHANICALS) ×1 IMPLANT
SOLUTION ANTI FOG 6CC (MISCELLANEOUS) ×2 IMPLANT
SOLUTION ELECTROLUBE (MISCELLANEOUS) ×3 IMPLANT
SPONGE GAUZE 2X2 STER 10/PKG (GAUZE/BANDAGES/DRESSINGS) ×1
SPONGE LAP 18X18 RF (DISPOSABLE) ×3 IMPLANT
STAPLER VISISTAT 35W (STAPLE) ×1 IMPLANT
SUCTION POOLE HANDLE (INSTRUMENTS) ×3
SURGIFLO W/THROMBIN 8M KIT (HEMOSTASIS) IMPLANT
SUT ETHILON 2 0 PS N (SUTURE) ×1 IMPLANT
SUT MNCRL AB 4-0 PS2 18 (SUTURE) ×4 IMPLANT
SUT NOVA NAB GS-21 0 18 T12 DT (SUTURE) ×3 IMPLANT
SUT PDS AB 1 CTX 36 (SUTURE) ×4 IMPLANT
SUT VIC AB 0 CT1 27 (SUTURE)
SUT VIC AB 0 CT1 27XBRD ANTBC (SUTURE) IMPLANT
SUT VIC AB 3-0 SH 27 (SUTURE)
SUT VIC AB 3-0 SH 27XBRD (SUTURE) IMPLANT
SUT VIC AB 4-0 PS2 18 (SUTURE) ×6 IMPLANT
SUT VLOC 180 2-0 9IN GS21 (SUTURE) ×2 IMPLANT
SYR 10ML LL (SYRINGE) ×2 IMPLANT
SYR 20CC LL (SYRINGE) ×3 IMPLANT
TOWEL OR 17X26 10 PK STRL BLUE (TOWEL DISPOSABLE) ×3 IMPLANT
TOWEL OR NON WOVEN STRL DISP B (DISPOSABLE) ×5 IMPLANT
TOWEL SURG RFD BLUE STRL DISP (DISPOSABLE) ×1 IMPLANT
TRAP SPECIMEN MUCOUS 40CC (MISCELLANEOUS) ×1 IMPLANT
TRAY FOLEY MTR SLVR 16FR STAT (SET/KITS/TRAYS/PACK) ×3 IMPLANT
UNDERPAD 30X30 (UNDERPADS AND DIAPERS) ×3 IMPLANT
WATER STERILE IRR 1000ML POUR (IV SOLUTION) ×3 IMPLANT
YANKAUER SUCT BULB TIP 10FT TU (MISCELLANEOUS) ×1 IMPLANT

## 2018-10-29 NOTE — Anesthesia Postprocedure Evaluation (Signed)
Anesthesia Post Note  Patient: Emergency planning/management officer  Procedure(s) Performed: XI ROBOTIC ASSISTED BILATERAL SALPINGO OOPHORECTOMY (Bilateral ) LAPAROSCOPIC LYSIS OF ADHESIONS (N/A ) ATTEMPTED LAPAROSCOPIC CONVERTED TO OPEN INCISIONAL HERNIA REPAIR WITH MESH (N/A )     Patient location during evaluation: PACU Anesthesia Type: General Level of consciousness: awake and alert Pain management: pain level controlled Vital Signs Assessment: post-procedure vital signs reviewed and stable Respiratory status: spontaneous breathing, nonlabored ventilation, respiratory function stable and patient connected to nasal cannula oxygen Cardiovascular status: blood pressure returned to baseline and stable Postop Assessment: no apparent nausea or vomiting Anesthetic complications: no    Last Vitals:  Vitals:   10/29/18 1510 10/29/18 1515  BP: (!) 136/107 (!) 141/76  Pulse: 77 64  Resp: 16 14  Temp: 36.6 C   SpO2: 100% 100%    Last Pain:  Vitals:   10/29/18 1515  TempSrc:   PainSc: 0-No pain                 Montez Hageman

## 2018-10-29 NOTE — Transfer of Care (Signed)
Immediate Anesthesia Transfer of Care Note  Patient: Emergency planning/management officer  Procedure(s) Performed: XI ROBOTIC ASSISTED BILATERAL SALPINGO OOPHORECTOMY (Bilateral ) LAPAROSCOPIC LYSIS OF ADHESIONS (N/A ) ATTEMPTED LAPAROSCOPIC CONVERTED TO OPEN INCISIONAL HERNIA REPAIR WITH MESH (N/A )  Patient Location: PACU  Anesthesia Type:General  Level of Consciousness: awake, alert  and oriented  Airway & Oxygen Therapy: Patient Spontanous Breathing and Patient connected to face mask oxygen  Post-op Assessment: Report given to RN and Post -op Vital signs reviewed and stable  Post vital signs: Reviewed and stable  Last Vitals:  Vitals Value Taken Time  BP 136/107 10/29/18 1507  Temp    Pulse 73 10/29/18 1508  Resp 18 10/29/18 1508  SpO2 100 % 10/29/18 1508  Vitals shown include unvalidated device data.  Last Pain:  Vitals:   10/29/18 0829  TempSrc: Oral      Patients Stated Pain Goal: 3 (49/32/41 9914)  Complications: No apparent anesthesia complications

## 2018-10-29 NOTE — Op Note (Signed)
OPERATIVE NOTE  Date: 10/29/18  Preoperative Diagnosis: right ovarian mass, elevated CA 125, obesity, abdominal adhesive disease, pelvic adhesive disease, ventral abdominal hernia.   Postoperative Diagnosis:  same  Procedure(s) Performed: Robotic-assisted laparoscopic bilateral salpingo-oophorectomy  Surgeon: Everitt Amber, M.D.  Assistant Surgeon: Lahoma Crocker M.D. (an MD assistant was necessary for tissue manipulation, management of robotic instrumentation, retraction and positioning due to the complexity of the case and hospital policies).   Anesthesia: Gen. endotracheal.  Specimens: Bilateral ovaries, fallopian tubes, pelvic washings  Estimated Blood Loss: 50 mL. Blood Replacement: None  Complications: none  Indication for Procedure:    Operative Findings: 15cm hernia in epigastric region complicated by omental and transverse colon adhesions. Pelvic adhesions between sigmoid colon and right ovarian mass. 8cm right ovarian complex mass filled with chocolate colored thick fluid, endometrioma on frozen section, densely adherent to the sigmoid colon, vaginal cuff and right pelvic sidewall. The left ovary and tube were grossly normal.  Frozen pathology was consistent with endometrioma (benign).  Procedure: The patient's taken to the operating room and placed under general endotracheal anesthesia testing difficulty. She is placed in a dorsolithotomy position and cervical acromial pad was placed. The arms were tucked with care taken to pad the olecranon process. And prepped and draped in usual sterile fashion. A foley was inserted in a sterile technique. A 67mm incision was made in the left upper quadrant palmer's point and a veress needle was inserted but unsuccessfully entered the peritoneum. Therefore a 5 mm Optiview trocar used to enter the abdomen under direct visualization. However, intraperitoneal entry was not successful. Therefore, we entered with the optiview in the right upper  quadrant with a controlled visualized entry with no trauma to underlying structures. With entry into the abdomen and then maintenance of 15 mm of mercury the patient was placed in Trendelenburg position. A large hernia sac was appreciated in the upper mid abdomen, however the anterior abdominal wall of the pelvis was free of adhesions. An incision was made in the umbilicus and a 64mm trochar was placed through this site. Two incisions were made lateral to the umbilical incision in the left and right abdomen measuring 43mm. These incisions were made approximately 10 cm lateral to the umbilical incision. 8 mm robotic trochars were inserted. The robot was docked.  The abdomen was inspected as was the pelvis.  Pelvic washings were obtained. Comprehensive sharp lysis of pelvic adhesions was performed for 30 minutes to sharply dissect the sigmoid colon from its attachments to the right ovary. These attachments were dense and there was unavoidable entry into the right ovarian cyst with rupture and spill of chocolate colored fluid. An incision was made on the right pelvic side wall peritoneum parallel to the IP ligament and the retroperitoneal space entered. The right ureter was identified and the para-rectal space was developed. The right ureter was carefully dissected from its attachments to the right ovarian mass. A window was created in the right broad ligament above the ureter. The right infundibulopelvic vessels were skeletonized cauterized and transected. The vaginal attachments and round ligaments similarly were cauterized and transected. Specimen was placed in an Endo Catch bag.  In a similar manner the left peritoneum and the side wall was incised, and the retroperitoneal space entered. The left ureter was identified and the left pararectal space was developed. The utero-ovarian ligament was skeletonized cauterized and transected. The left utero-ovarian ligaments were cauterized and transected in the left adnexa  was placed in an Endo Catch bag.  The  abdomen was copiously irrigated and drained and all operative sites inspected and hemostasis was assured. The bowel was closely inspected where it had been dissected free from the ovary and it was noted to be free of gross injury.  The robot was undocked. The camera was placed through the mid abdominal incision. The contents of the left Endo Catch bag were first aspirated and then morcellated to facilitate removal from the abdominal cavity through the right upper quadrant incision in a contained manner. In a similar fashion the contents of the right Endo Catch bag or morcellated to facilitate removal from the abdominal cavity in a contained manner.  Dr Rosendo Gros continued with his portion of the procedure (hernia repair - see separate dictation).    The patient had sequential compression devices for VTE prophylaxis.         Disposition: case continuation.  Condition: stable  Donaciano Eva, MD

## 2018-10-29 NOTE — Anesthesia Procedure Notes (Signed)
Procedure Name: Intubation Date/Time: 10/29/2018 10:05 AM Performed by: Niel Hummer, CRNA Pre-anesthesia Checklist: Patient being monitored, Suction available, Emergency Drugs available and Patient identified Patient Re-evaluated:Patient Re-evaluated prior to induction Oxygen Delivery Method: Circle system utilized Preoxygenation: Pre-oxygenation with 100% oxygen Induction Type: IV induction and Rapid sequence Laryngoscope Size: Mac and 4 Grade View: Grade I Tube type: Oral Tube size: 7.0 mm Number of attempts: 1 Airway Equipment and Method: Stylet Placement Confirmation: ETT inserted through vocal cords under direct vision,  positive ETCO2 and breath sounds checked- equal and bilateral Secured at: 19 cm Dental Injury: Teeth and Oropharynx as per pre-operative assessment

## 2018-10-29 NOTE — H&P (Signed)
History of Present Illness  The patient is a 53 year old female who presents with an incisional hernia. Referred by: Dr. Everitt Amber Chief Complaint: Incisional hernia  Patient is a 53 year old female with a history of ovarian masses. Patient was recently seen by Dr. Denman George to schedule surgery for excision of ovarian masses. Patient underwent CT scan which revealed a large incisional hernia. Hernia measures approximately 7 cm. Review this personally.  Patient does have a history of having laparoscopic gastric bypass in year 2000. Patient states she had reoperation 6 hours after her initial surgery secondary to hemorrhage. Patient states that in 2006 she underwent laparoscopic incisional hernia repair with mesh. It appears that these were at an outside institution.  Patient does state that she has symptoms of early fullness and pain with eating. She does state that she is able to reduce her hernia. She has had a history of incarceration in the past with the hernia.  Of note the patient's recent CT scan did reveal a 9 x 6.5 cm gastric bezoar within the gastric pouch. This has not been worked up in the past.   Past Surgical History Appendectomy  Gallbladder Surgery - Laparoscopic  Gastric Bypass  Hysterectomy (due to cancer) - Partial  Knee Surgery  Left. Laparoscopic Inguinal Hernia Surgery  Right. Oral Surgery  Ventral / Umbilical Hernia Surgery  Right.  Diagnostic Studies History  Colonoscopy  1-5 years ago Mammogram  1-3 years ago Pap Smear  >5 years ago  Allergies  No Known Drug Allergies [09/04/2018]: Allergies Reconciled   Medication History  Vitamin D (Ergocalciferol) (1.25 MG(50000 UT) Capsule, Oral) Active. Pravastatin Sodium (80MG  Tablet, Oral) Active. Edarbyclor (40-12.5MG  Tablet, Oral) Active. Cetirizine HCl (10MG  Capsule, Oral) Active. Medications Reconciled  Social History  Alcohol use  Occasional alcohol use. No drug use   Tobacco use  Never smoker.  Family History  Arthritis  Mother. Breast Cancer  Family Members In General. Cancer  Brother, Family Members In General, Sister. Cervical Cancer  Family Members In General. Colon Cancer  Family Members In General. Colon Polyps  Father, Mother. Diabetes Mellitus  Family Members In General, Father. Heart Disease  Family Members In General. Hypertension  Family Members In General, Father, Sister. Kidney Disease  Family Members In General, Mother. Malignant Neoplasm Of Pancreas  Father. Melanoma  Father, Mother. Ovarian Cancer  Family Members In General. Prostate Cancer  Father. Thyroid problems  Mother.  Pregnancy / Birth History  Age at menarche  59 years. Contraceptive History  Depo-provera, Oral contraceptives. Gravida  2 Maternal age  69-25 Para  0  Other Problems Arthritis  Back Pain  Cholelithiasis  Gastroesophageal Reflux Disease  High blood pressure  Hypercholesterolemia  Inguinal Hernia  Pancreatitis  Thyroid Disease  Umbilical Hernia Repair     Review of Systems General Not Present- Appetite Loss, Chills, Fatigue, Fever, Night Sweats, Weight Gain and Weight Loss. Skin Not Present- Change in Wart/Mole, Dryness, Hives, Jaundice, New Lesions, Non-Healing Wounds, Rash and Ulcer. HEENT Present- Seasonal Allergies. Not Present- Earache, Hearing Loss, Hoarseness, Nose Bleed, Oral Ulcers, Ringing in the Ears, Sinus Pain, Sore Throat, Visual Disturbances, Wears glasses/contact lenses and Yellow Eyes. Respiratory Not Present- Bloody sputum, Chronic Cough, Difficulty Breathing, Snoring and Wheezing. Breast Not Present- Breast Mass, Breast Pain, Nipple Discharge and Skin Changes. Cardiovascular Not Present- Chest Pain, Difficulty Breathing Lying Down, Leg Cramps, Palpitations, Rapid Heart Rate, Shortness of Breath and Swelling of Extremities. Gastrointestinal Present- Abdominal Pain, Change in Bowel Habits,  Constipation, Indigestion and  Vomiting. Not Present- Bloating, Bloody Stool, Chronic diarrhea, Difficulty Swallowing, Excessive gas, Gets full quickly at meals, Hemorrhoids, Nausea and Rectal Pain. Female Genitourinary Present- Frequency, Pelvic Pain and Urgency. Not Present- Nocturia and Painful Urination. Musculoskeletal Present- Back Pain and Joint Pain. Not Present- Joint Stiffness, Muscle Pain, Muscle Weakness and Swelling of Extremities. Neurological Not Present- Decreased Memory, Fainting, Headaches, Numbness, Seizures, Tingling, Tremor, Trouble walking and Weakness. Psychiatric Not Present- Anxiety, Bipolar, Change in Sleep Pattern, Depression, Fearful and Frequent crying. Endocrine Not Present- Cold Intolerance, Excessive Hunger, Hair Changes, Heat Intolerance, Hot flashes and New Diabetes. Hematology Not Present- Blood Thinners, Easy Bruising, Excessive bleeding, Gland problems, HIV and Persistent Infections. All other systems negative  BP 134/62   Pulse 77   Temp 97.9 F (36.6 C) (Oral)   Resp 16   Ht 5' 3.5" (1.613 m)   Wt 91.7 kg   LMP 03/15/2009   SpO2 97%   BMI 35.26 kg/m   Physical Exam  The physical exam findings are as follows: Note:Constitutional: No acute distress, conversant, appears stated age  Eyes: Anicteric sclerae, moist conjunctiva, no lid lag  Neck: No thyromegaly, trachea midline, no cervical lymphadenopathy  Lungs: Clear to auscultation biilaterally, normal respiratory effot  Cardiovascular: regular rate & rhythm, no murmurs, no peripheal edema, pedal pulses 2+  GI: Soft, no masses or hepatosplenomegaly, non-tender to palpation, lower midline incision, hernia that is reducible. Hernia palpation is approximately 7-8 cm, nontender to palpation.  MSK: Normal gait, no clubbing cyanosis, edema  Skin: No rashes, palpation reveals normal skin turgor  Psychiatric: Appropriate judgment and insight, oriented to person, place, and  time  Abdomen Inspection    Assessment & Plan  INCISIONAL HERNIA, WITHOUT OBSTRUCTION OR GANGRENE (K43.2) Impression: 53 year old female with a large incisional hernia, ovarian mass, gastric bezoar.   1.Discussed that this would likely require robotic versus open hernia repair with mesh. This will likely require either retrorectus or TAR repair.  2. All risks and benefits were discussed with the patient to generally include, but not limited to: infection, bleeding, damage to surrounding structures, acute and chronic nerve pain, and recurrence. Alternatives were offered and described. All questions were answered and the patient voiced understanding of the procedure and wishes to proceed at this point with hernia repair.

## 2018-10-29 NOTE — H&P (Signed)
Consult Note: Gyn-Onc  Consult was requested by Dr. Willis Modena for the evaluation of Lisa Stanton 53 y.o. female  CC:  No chief complaint on file.   Assessment/Plan:  [name]  is a 53 y.o.  year old with a right ovarian complex cyst in the setting of an elevated CA 125, large ventral hernia and complex prior surgical history.  I have a low suspicion for malignancy overall, given that there are ultrasound findings consistent with endometrioma, which is consistent with her past medical history and a Ca1 25 value in the range of 160.  We will first obtain a CT abdomen and pelvis to better evaluate the upper abdomen.  This will also enable [name] to visualize the extent of the ventral hernia.  She has a planned appointment with Dr. Rosendo Gros from [facility] surgery later this month.  I work with Dr. Johney Frame team to coordinate surgery.  At that time we will remove the ovaries, and repair the ventral hernia if Dr. Rosendo Gros thinks this is appropriate.  We will send the ovary for frozen section evaluation and perform staging if malignancy is identified.  I explained to Ms.Maberry that she is at higher than average risk for postoperative complications due to her obesity (BMI 37 kg/m), and history of extensive prior abdominal surgeries including a laparotomy following a complication from gastric bypass surgery, and prior ventral hernia repair with mesh.  I explained that these risks are particularly related to risk of visceral injury.  But she also has risks including  bleeding, infection, damage to internal organs (such as bladder,ureters, bowels), blood clot, reoperation and rehospitalization.   She is understanding of this and desires to proceed with a minimally invasive procedure.  She understands that conversion to laparotomy might be necessary depending upon intraoperative findings.  She has a significant history of malignancy in her family, and therefore I am recommending  consideration of BSO even if a benign lesion is found on the right ovary.  She will contemplate this further and inform me prior to her surgery whether she desires to have 1 of both ovaries removed.  She understands if malignancy is identified in the right ovary that we would recommend removing the left ovary as part of staging procedure.   HPI: [name] is a 53 year old P0 who is seen in consultation at the request of Dr Meissinger for a right ovarian complex cystic mass and elevated CA 125.   The patient reports having at least 10 years history of intermittent menstrual cramp-like pain.  She reports that out of the blue in March 2020 this became a more constant pain.  Ibuprofen helped a little but was also associated with some hip pain.  As response to developing this pain symptoms she was evaluated by her gynecologist who performed a transvaginal ultrasound on July 30, 2018.  This revealed a normal left ovary measuring 1.9 x 1.5 x 1.4 cm.  The uterus was surgically absent.  The right ovary measured 6.98 x 1.79 x 4.98 cm.  It contained 2 ovarian cyst 1 that was slightly larger at 6 cm and the other at approximately 4 cm.  The sick centimeters cyst appeared consistent with a large endometrioma.  It was thick-walled with Doppler around the periphery.  There was a solid-appearing mass with Doppler flow closest to the vaginal cuff measuring 3.8 cm.  Ca1 25 was drawn on July 31, 2018 and was elevated at 160 (upper limit of normal 38).  Patient has  a complex past surgical history.  This includes an appendectomy with abdominoplasty in 2004.  Prior to that she had a cholecystectomy in 2003.  And prior to that in 2002 had had a gastric bypass performed laparoscopically but then complicated postoperatively by intra-abdominal bleed for which she underwent an emergent laparotomy with repair.  The patient developed a ventral hernia which was repaired and then subsequently underwent a laparoscopic  hysterectomy in 2010.  Endometriosis was identified on pathology.  The patient reports that this hysterectomy was performed due to heavy bleeding.  Subsequent to having hysterectomy she then underwent an laparoscopic ventral hernia repair with mesh at [facility].  The surgery was uncomplicated.  However following that surgery she developed recurrence of the hernia.  The patient is obese with a BMI of 37 kg/m. She has a past medical history that is significant for prediabetes, hypertension and hypercholesterolemia.  Family history significant for father with history of pancreatic cancer, mother with history of melanoma, a sister and brother both who had thyroid cancer, a maternal grandfather with history of colon cancer, maternal grandmother with history of breast cancer in her 23s.  Current Meds:  Outpatient Encounter Medications as of 08/14/2018  Medication Sig  . Azilsartan-Chlorthalidone (EDARBYCLOR) 40-12.5 MG TABS Take 1 tablet by mouth daily.  . cetirizine (ZYRTEC) 10 MG tablet Take 10 mg by mouth daily.  [name] docusate sodium (COLACE) 100 MG capsule Take 1 capsule (100 mg total) by mouth 2 (two) times daily.  . fluticasone (FLONASE) 50 MCG/ACT nasal spray Place 2 sprays into both nostrils daily as needed for allergies or rhinitis.  [name] ibuprofen (ADVIL,MOTRIN) 800 MG tablet Take 1 tablet (800 mg total) by mouth every 8 (eight) hours as needed for moderate pain.  . Multiple Vitamin (MULTIVITAMIN) tablet Take 1 tablet by mouth daily.    [name] omeprazole (PRILOSEC) 20 MG capsule Take 20 mg by mouth daily.  . pravastatin (PRAVACHOL) 80 MG tablet Take 1 tablet (80 mg total) by mouth daily.  . Vitamin D, Ergocalciferol, (DRISDOL) 1.25 MG (50000 UT) CAPS capsule Take 1 capsule (50,000 Units total) by mouth every 7 (seven) days.  . [DISCONTINUED] ibuprofen (ADVIL,MOTRIN) 800 MG tablet Take 800 mg by mouth every 8 (eight) hours as needed.  . [DISCONTINUED] BELSOMRA 10 MG TABS TAKE 1 TABLET BY MOUTH DAILY.   . [DISCONTINUED] cholecalciferol (VITAMIN D) 1000 UNITS tablet Take 1,000 Units by mouth daily.  . [DISCONTINUED] Sennosides 8.6 MG CAPS Take 1 capsule by mouth daily.   No facility-administered encounter medications on file as of 08/14/2018.     Allergy:  Allergies  Allergen Reactions  . Benicar Hct [Olmesartan Medoxomil-Hctz] Shortness Of Breath  . Lisinopril Other (See Comments)    Cough   . Singulair [Montelukast Sodium] Other (See Comments)    Cough   . Crest [Sodium Fluoride] Rash and Other (See Comments)    Redness    Social Hx:   Social History   Socioeconomic History  . Marital status: Married    Spouse name: Doren Custard  . Number of children: 0  . Years of education: HS  . Highest education level: Not on file  Occupational History  . Occupation: [employer]: [employer]  . Financial resource strain: Not on file  . Food insecurity    Worry: Not on file    Inability: Not on file  . Transportation needs    Medical: Not on file    Non-medical: Not on file  Tobacco Use  . Smoking status: Never Smoker  . Smokeless tobacco: Never Used  Substance and Sexual Activity  . Alcohol use: Yes    Comment: 6 times/year  . Drug use: No  . Sexual activity: Yes    Partners: Male    Birth control/protection: Surgical  Lifestyle  . Physical activity    Days per week: Not on file    Minutes per session: Not on file  . Stress: Not on file  Relationships  . Social Herbalist on phone: Not on file    Gets together: Not on file    Attends religious service: Not on file    Active member of club or organization: Not on file    Attends meetings of clubs or organizations: Not on file    Relationship status: Not on file  . Intimate partner violence    Fear of current or ex partner: Not on file    Emotionally abused: Not on file    Physically abused: Not on file    Forced sexual activity: Not on file  Other Topics Concern  . Not on file   Social History Narrative   2 caffeine per day. Regular exercise, walking.      Past Surgical Hx:  Past Surgical History:  Procedure Laterality Date  . ABDOMINAL HERNIA REPAIR  09/2010   incarcerated that required surgery.    . ABDOMINAL HYSTERECTOMY     partial  . ABDOMINOPLASTY  2006  . APPENDECTOMY  February 2004  . BIOPSY  10/02/2018   Procedure: BIOPSY;  Surgeon: Yetta Flock, MD;  Location: [facility];  Service: Gastroenterology;;  . Wilmon Pali RELEASE  May 1998   Right hand  . CHOLECYSTECTOMY  February 2003  . COLONOSCOPY    . Cysts removed  September 1993   Left hand  . ESOPHAGOGASTRODUODENOSCOPY (EGD) WITH PROPOFOL N/A 10/02/2018   Procedure: ESOPHAGOGASTRODUODENOSCOPY (EGD) WITH PROPOFOL;  Surgeon: Yetta Flock, MD;  Location: [facility];  Service: Gastroenterology;  Laterality: N/A;  . KNEE SURGERY  July 1986   Left knee  . PARTIAL HYSTERECTOMY  November 2010  . ROUX-EN-Y GASTRIC BYPASS  06/2000  . UMBILICAL HERNIA REPAIR    . WISDOM TOOTH EXTRACTION      Past Medical Hx:  Past Medical History:  Diagnosis Date  . Anemia   . Arthritis   . Back pain   . Complication of anesthesia    woke up during 1986 left knee surgery  . Constipation   . Dyspnea    caused by Benicar, no other issue at this time  . Fatty liver   . GERD (gastroesophageal reflux disease)   . Hip pain   . History of abdominal hernia   . History of carpal tunnel syndrome    Bilateral  . History of gallstones   . History of umbilical hernia   . Hyperlipidemia   . Hypertension   . Iron deficiency anemia   . Joint pain   . Knee pain   . Lower leg edema   . Murmur   . Pneumonia    9th grade  . PONV (postoperative nausea and vomiting)   . Thyroid nodule    right side  . Vitamin B 12 deficiency    resolved    Past Gynecological History:  See HPI Patient's last menstrual period was 03/15/2009.  Family Hx:  Family History  Problem Relation Age of Onset  .  Osteoporosis Mother   . Rheum  arthritis Mother   . Kidney disease Mother   . Thyroid disease Mother   . Obesity Mother   . Breast cancer Maternal Grandmother   . Colon cancer Maternal Grandfather   . Thyroid cancer Brother 16  . Cancer Brother        thyroid  . Diabetes Father   . Hypertension Father   . Hyperlipidemia Father   . Cancer Father        pancreatic  . Obesity Father   . Diabetes Paternal Grandfather   . Heart disease Paternal Grandfather   . Cancer Sister        thyroid    Review of Systems:  Constitutional  Feels well,    ENT Normal appearing ears and nares bilaterally Skin/Breast  No rash, sores, jaundice, itching, dryness Cardiovascular  No chest pain, shortness of breath, or edema  Pulmonary  No cough or wheeze.  Gastro Intestinal  No nausea, vomitting, or diarrhoea. No bright red blood per rectum, no abdominal pain, change in bowel movement, or constipation.  Genito Urinary  No frequency, urgency, dysuria, + pelvic pain Musculo Skeletal  No myalgia, arthralgia, joint swelling or pain  Neurologic  No weakness, numbness, change in gait,  Psychology  No depression, anxiety, insomnia.   Vitals:  Blood pressure 134/62, pulse 77, temperature 97.9 F (36.6 C), temperature source Oral, resp. rate 16, height 5' 3.5" (1.613 m), weight 202 lb 4 oz (91.7 kg), last menstrual period 03/15/2009, SpO2 97 %.  Physical Exam: WD in NAD Neck  Supple NROM, without any enlargements.  Lymph Node Survey No cervical supraclavicular or inguinal adenopathy Cardiovascular  Pulse normal rate, regularity and rhythm. S1 and S2 normal.  Lungs  Clear to auscultation bilateraly, without wheezes/crackles/rhonchi. Good air movement.  Skin  No rash/lesions/breakdown  Psychiatry  Alert and oriented to person, place, and time  Abdomen  Normoactive bowel sounds, abdomen soft, non-tender and obese with evidence of a moderately large upper abdominal ventral hernia.  Back No  CVA tenderness Genito Urinary  Vulva/vagina: Normal external female genitalia.   No lesions. No discharge or bleeding.  Bladder/urethra:  No lesions or masses, well supported bladder  Vagina: normal, no lesions  Cervix and uterus surgically absent  Adnexa: fullness but no discrete masses. Rectal  Good tone, no masses no cul de sac nodularity.  Extremities  No bilateral cyanosis, clubbing or edema.   Thereasa Solo, MD  10/29/2018, 9:39 AM

## 2018-10-29 NOTE — Anesthesia Preprocedure Evaluation (Signed)
Anesthesia Evaluation  Patient identified by MRN, date of birth, ID band Patient awake    Reviewed: Allergy & Precautions, NPO status , Patient's Chart, lab work & pertinent test results  History of Anesthesia Complications (+) PONV  Airway Mallampati: II  TM Distance: >3 FB Neck ROM: Full    Dental no notable dental hx.    Pulmonary neg pulmonary ROS,    Pulmonary exam normal breath sounds clear to auscultation       Cardiovascular hypertension, Pt. on medications Normal cardiovascular exam Rhythm:Regular Rate:Normal     Neuro/Psych negative neurological ROS  negative psych ROS   GI/Hepatic Neg liver ROS, GERD  Medicated and Controlled,  Endo/Other  Morbid obesity  Renal/GU negative Renal ROS  negative genitourinary   Musculoskeletal negative musculoskeletal ROS (+)   Abdominal   Peds negative pediatric ROS (+)  Hematology negative hematology ROS (+)   Anesthesia Other Findings   Reproductive/Obstetrics negative OB ROS                             Anesthesia Physical Anesthesia Plan  ASA: III  Anesthesia Plan: General   Post-op Pain Management:    Induction: Intravenous  PONV Risk Score and Plan: 4 or greater and Ondansetron, Dexamethasone, Midazolam, Scopolamine patch - Pre-op and Treatment may vary due to age or medical condition  Airway Management Planned: Oral ETT  Additional Equipment:   Intra-op Plan:   Post-operative Plan: Extubation in OR  Informed Consent: I have reviewed the patients History and Physical, chart, labs and discussed the procedure including the risks, benefits and alternatives for the proposed anesthesia with the patient or authorized representative who has indicated his/her understanding and acceptance.     Dental advisory given  Plan Discussed with: CRNA  Anesthesia Plan Comments:         Anesthesia Quick Evaluation

## 2018-10-29 NOTE — Progress Notes (Signed)
Spoke with pharmacist about combination medication: Azilsartan- Chlorthalidone not being available. Nurse spoke with patient about having someone bring medication to hospital for use and patient stated she stays 30 minutes away and that one night without medication should not hurt.  Pharmacy and MD made aware,  BPs are WNL at this time.  Norlene Duel RN, BSN

## 2018-10-29 NOTE — Op Note (Addendum)
10/29/2018  2:40 PM  PATIENT:  Lisa Stanton  53 y.o. female  PRE-OPERATIVE DIAGNOSIS: Recurrent Incisional ventral hernia  POST-OPERATIVE DIAGNOSIS:  Recurrent Incisional ventral hernia  PROCEDURE:  Procedure(s):  LAPAROSCOPIC LYSIS OF ADHESIONS (N/A) x74min ATTEMPTED LAPAROSCOPIC CONVERTED TO OPEN INCISIONAL HERNIA REPAIR WITH MESH  Bilateral musculocutaneous flap advancements(N/A) Bilaterall TAPP nerve block  SURGEON:  Surgeon(s) and Role:    Ralene Ok, MD - Primary    Stark Klein, MD - Assisting: Was crucial in helping with exposure and repair of the hernia  ANESTHESIA:   local, regional and general  EBL:  150 mL   BLOOD ADMINISTERED:none  DRAINS: (19) Jackson-Pratt drain(s) with closed bulb suction in the retrorectus space   LOCAL MEDICATIONS USED:  Amount: 40 ml of Exparel  SPECIMEN:  No Specimen  DISPOSITION OF SPECIMEN:  N/A  COUNTS:  YES  TOURNIQUET:  * No tourniquets in log *  DICTATION: .Dragon Dictation Findings: Patient had a large of intra-abdominal adhesions.  There was some omental adhesions as well as transverse colon adhesions to the anterior abdominal wall.  Patient had a large recurrent incarcerated incisional hernia.  There was mesh in place.  The mesh appeared to have eventrated from the hernia repair.  A majority of the mesh was excised and discarded.  Patient had a 15 x 15 cm Ethicon Prolene macro porous mesh placed in the retrorectus space.  The deep posterior and anterior midline fascia was reapproximated without any undue tension.  After the patient was consented he was taken back to the operating room and placed in the supine position with bilateral SCDs in place. The patient was prepped and draped in usual sterile fashion. Antibiotics were confirmed and timeout was called and all facts verified.  The previous case by Dr. Denman George will be dictated under separate cover. The previous robotic trochars were used to help lyse  adhesions.  There was some thin as well as transverse colon adhered to the anterior abdominal wall.  These were taken down both sharply with cautery to maintain hemostasis.  Adhesio lysis was approximately 45 minutes.  Once this was taken down I proceeded to enter the left-sided retrorectus space with a 5 mm Optiview.  The area was entered appropriately.  Insufflation was began.  I attempted to create a space in the retrorectus space in the left side with blunt dissection.  Some sharp dissection did take place.  I continue with the hernia dissection however secondary to adhesions of the previously placed mesh I decided to abandon the laparoscopic approach and convert to open.  A #10 blade was used to create a midline incision.  I proceeded to use electrocautery to maintain hemostasis and dissection took place in the most superior portion of the wound down to the subcutaneous tissues fat to the anterior fascia. This was incised. The fascia was elevated and 2 Kocher clamps.  The hernia sac and the abdominal cavity were entered bluntly.  The mesh was evident in the midline wound.   At this time I proceeded to retract is rectus muscles medially.  At this time the posterior fascia was then incised.  Using blunt dissection the belly was dissected away from the posterior rectus fascia.  This was used to help in mobilizing and advancing the posterior and anterior fascia to the midline. This was done inferiorly and superiorly on each bilateral side..  At the midline inferior and superior portions of the linea alba this was taken down from the anterior abdominal wall.  This was done bilaterally.  The area was checked for hemostasis.  At this time the posterior rectus fascia was reapproximated using #1 PDS in a standard running fashion x2.  I proceeded to irrigate out the retrorectus space.  The area was measured and seemed to be approximately 15x14 cm in size.  A piece of Ethicon Prolene macro porous mesh was selected  and placed into the retrorectus space.  This fit well and flat.  Eviseal glue was then used to fasten the mesh to the midline fascia.  0Novafils were used as transfascial sutures via stab incisions to the bilateral sides x 3 and x2 superiorly and inferiorly.  This was done with an Endoclose device.  The previous 19mm trochar from the previous surgery I closed with a 0 vicryl x1 in an interrupted fashion.  At this time a Richmond drain was placed in the right lower quadrant area.  This was placed in the retrorectus space over the mesh.  At this time 40 cc of Exparel and saline mixed we will treated bilaterally as a tapp block.  At this time the anterior fascia was able to be advanced to the midline and were reapproximated using #1 PDS in a standard running fashion x2.  The subcutaneous tissue was irrigated out with sterile saline.  The skin was then reapproximated using skin staples.  The midline wound was then dressed with honeycomb dressing.  The patient tolerated procedure well was taken to the recovery room in stable condition.   PLAN OF CARE: Admit for overnight observation  PATIENT DISPOSITION:  PACU - hemodynamically stable.   Delay start of Pharmacological VTE agent (>24hrs) due to surgical blood loss or risk of bleeding: yes

## 2018-10-29 NOTE — Discharge Instructions (Signed)
[facility] _______[facility] ° °HERNIA REPAIR: POST OP INSTRUCTIONS ° °Always review your discharge instruction sheet given to you by the facility where your surgery was performed. °IF YOU HAVE DISABILITY OR FAMILY LEAVE FORMS, YOU MUST BRING THEM TO THE OFFICE FOR PROCESSING.   °DO NOT GIVE THEM TO YOUR DOCTOR. ° °1. A  prescription for pain medication may be given to you upon discharge.  Take your pain medication as prescribed, if needed.  If narcotic pain medicine is not needed, then you may take acetaminophen (Tylenol) or ibuprofen (Advil) as needed. °2. Take your usually prescribed medications unless otherwise directed. °If you need a refill on your pain medication, please contact your pharmacy.  They will contact our office to request authorization. Prescriptions will not be filled after 5 pm or on week-ends. °3. You should follow a light diet the first 24 hours after arrival home, such as soup and crackers, etc.  Be sure to include lots of fluids daily.  Resume your normal diet the day after surgery. °4.Most patients will experience some swelling and bruising around the umbilicus or in the groin and scrotum.  Ice packs and reclining will help.  Swelling and bruising can take several days to resolve.  °6. It is common to experience some constipation if taking pain medication after surgery.  Increasing fluid intake and taking a stool softener (such as Colace) will usually help or prevent this problem from occurring.  A mild laxative (Milk of Magnesia or Miralax) should be taken according to package directions if there are no bowel movements after 48 hours. °7. Unless discharge instructions indicate otherwise, you may remove your bandages 24-48 hours after surgery, and you may shower at that time.  You may have steri-strips (small skin tapes) in place directly over the incision.  These strips should be left on the skin for 7-10 days.  If your surgeon used skin glue on the incision, you may shower in  24 hours.  The glue will flake off over the next 2-3 weeks.  Any sutures or staples will be removed at the office during your follow-up visit. °8. ACTIVITIES:  You may resume regular (light) daily activities beginning the next day--such as daily self-care, walking, climbing stairs--gradually increasing activities as tolerated.  You may have sexual intercourse when it is comfortable.  Refrain from any heavy lifting or straining until approved by your doctor. ° °a.You may drive when you are no longer taking prescription pain medication, you can comfortably wear a seatbelt, and you can safely maneuver your car and apply brakes. °b.RETURN TO WORK:   °_____________________________________________ ° °9.You should see your doctor in the office for a follow-up appointment approximately 2-3 weeks after your surgery.  Make sure that you call for this appointment within a day or two after you arrive home to insure a convenient appointment time. °10.OTHER INSTRUCTIONS: _________________________ °   _____________________________________ ° °WHEN TO CALL YOUR DOCTOR: °1. Fever over 101.0 °2. Inability to urinate °3. Nausea and/or vomiting °4. Extreme swelling or bruising °5. Continued bleeding from incision. °6. Increased pain, redness, or drainage from the incision ° °The clinic staff is available to answer your questions during regular business hours.  Please don’t hesitate to call and ask to speak to one of the nurses for clinical concerns.  If you have a medical emergency, go to the nearest emergency room or call 911.  A surgeon from [facility] is always on call at the hospital ° ° °[address], [address], [place], [place]  [address] ? ° [address] [address], [place], [place]   [address] °[phone] ? [phone] ? FAX [phone] °Web site: [email or web address] ° °

## 2018-10-30 ENCOUNTER — Encounter (HOSPITAL_COMMUNITY): Payer: Self-pay | Admitting: Gynecologic Oncology

## 2018-10-30 DIAGNOSIS — N801 Endometriosis of ovary: Secondary | ICD-10-CM | POA: Diagnosis not present

## 2018-10-30 LAB — CBC
HCT: 31.8 % — ABNORMAL LOW (ref 36.0–46.0)
Hemoglobin: 10.2 g/dL — ABNORMAL LOW (ref 12.0–15.0)
MCH: 30.4 pg (ref 26.0–34.0)
MCHC: 32.1 g/dL (ref 30.0–36.0)
MCV: 94.6 fL (ref 80.0–100.0)
Platelets: 260 10*3/uL (ref 150–400)
RBC: 3.36 MIL/uL — ABNORMAL LOW (ref 3.87–5.11)
RDW: 15.2 % (ref 11.5–15.5)
WBC: 9.1 10*3/uL (ref 4.0–10.5)
nRBC: 0 % (ref 0.0–0.2)

## 2018-10-30 LAB — BASIC METABOLIC PANEL
Anion gap: 9 (ref 5–15)
BUN: 14 mg/dL (ref 6–20)
CO2: 25 mmol/L (ref 22–32)
Calcium: 8.2 mg/dL — ABNORMAL LOW (ref 8.9–10.3)
Chloride: 104 mmol/L (ref 98–111)
Creatinine, Ser: 0.62 mg/dL (ref 0.44–1.00)
GFR calc Af Amer: >60 mL/min (ref >60)
GFR calc non Af Amer: >60 mL/min (ref >60)
Glucose, Bld: 113 mg/dL — ABNORMAL HIGH (ref 70–99)
Potassium: 3.6 mmol/L (ref 3.5–5.1)
Sodium: 138 mmol/L (ref 135–145)

## 2018-10-30 MED ORDER — KETOROLAC TROMETHAMINE 30 MG/ML IJ SOLN: 30.0000 mg | Freq: Four times a day (QID) | INTRAMUSCULAR | Status: DC | PRN

## 2018-10-30 MED ORDER — OXYCODONE HCL 5 MG PO TABS: 5.0000 mg | ORAL_TABLET | ORAL | 0 refills | Status: DC | PRN

## 2018-10-30 NOTE — Progress Notes (Signed)
1 Day Post-Op Procedure(s) (LRB): XI ROBOTIC ASSISTED BILATERAL SALPINGO OOPHORECTOMY (Bilateral) LAPAROSCOPIC LYSIS OF ADHESIONS (N/A) ATTEMPTED LAPAROSCOPIC CONVERTED TO OPEN INCISIONAL HERNIA REPAIR WITH MESH (N/A)  Subjective: Patient reports moderate abdominal soreness but reports relief with pain medication PRN. She ambulated to the bathroom this am without difficulty and voided.  No nausea or emesis reported this am.  Denies chest pain, dyspnea, passing flatus. No concerns voiced. Requesting pillow to splint her abdomen with when coughing mild phlegm.    Objective: Vital signs in last 24 hours: Temp:  [97.7 F (36.5 C)-99 F (37.2 C)] 98.2 F (36.8 C) (06/17 2902) Pulse Rate:  [59-90] 90 (06/17 0613) Resp:  [8-18] 12 (06/17 0700) BP: (104-145)/(62-107) 114/69 (06/17 0613) SpO2:  [92 %-100 %] 94 % (06/17 0613) Last BM Date: 10/28/18  Intake/Output from previous day: 06/16 0701 - 06/17 0700 In: 3445.6 [P.O.:840; I.V.:2355.6; IV Piggyback:250] Out: 1115 [Urine:1000; Drains:245; Blood:400]  Physical Examination: General: alert, cooperative and no distress Resp: clear to auscultation bilaterally Cardio: regular rate and rhythm, S1, S2 normal, no murmur, click, rub or gallop GI: incision: Lap sites to the abdomen with no drainage present, op site dressing in place-stain marked-no active bleeding noted underneath, JP drain charged with minimal amount of sanguinous drainage present and abdomen obese, binder in place, active bowel sounds present Extremities: extremities normal, atraumatic, no cyanosis or edema  Labs: WBC/Hgb/Hct/Plts:  9.1/10.2/31.8/260 (06/17 0404) BUN/Cr/glu/ALT/AST/amyl/lip:  14/0.62/--/--/--/--/-- (06/17 0404)  Assessment: 53 y.o. s/p Procedure(s): XI ROBOTIC ASSISTED BILATERAL SALPINGO OOPHORECTOMY LAPAROSCOPIC LYSIS OF ADHESIONS ATTEMPTED LAPAROSCOPIC CONVERTED TO OPEN INCISIONAL HERNIA REPAIR WITH MESH: stable Pain:  Pain is well-controlled on PRN  medications.  Heme: Hgb 10.2 and Hct 31.8 this am. Pre-op Hgb 12.6 and Hct 40.4.    CV: BP and HR stable. Continue to monitor with ordered vital signs until discharge.  GI:  Tolerating po: Yes. Antiemetics ordered PRN.   GU: Voiding since foley removal. Adequate output reported.   FEN: No critical values on Bmet this am.  Prophylaxis: SCDs. Lovenox ordered.  Plan: Continue plan per Dr. Rosendo Gros. Possible discharge later today. Diet as tolerated. Encourage ambulation in the halls, IS use    LOS: 0 days    Lisa Stanton 10/30/2018, 9:09 AM

## 2018-10-30 NOTE — Progress Notes (Signed)
1 Day Post-Op   Subjective/Chief Complaint: Doing well this AM tol CLD Mobilizing slowly   Objective: Vital signs in last 24 hours: Temp:  [97.7 F (36.5 C)-99 F (37.2 C)] 98.2 F (36.8 C) (06/17 1497) Pulse Rate:  [59-90] 90 (06/17 0613) Resp:  [8-18] 12 (06/17 0700) BP: (104-145)/(62-107) 114/69 (06/17 0613) SpO2:  [92 %-100 %] 94 % (06/17 0263) Weight:  [91.7 kg] 91.7 kg (06/16 0858) Last BM Date: 10/28/18  Intake/Output from previous day: 06/16 0701 - 06/17 0700 In: 3445.6 [P.O.:840; I.V.:2355.6; IV Piggyback:250] Out: 7858 [Urine:1000; Drains:245; Blood:400] Intake/Output this shift: No intake/output data recorded.  General appearance: alert and cooperative GI: soft, non-tender; bowel sounds normal; no masses,  no organomegaly and inc c di  Lab Results:  Recent Labs    10/30/18 0404  WBC 9.1  HGB 10.2*  HCT 31.8*  PLT 260   BMET Recent Labs    10/30/18 0404  NA 138  K 3.6  CL 104  CO2 25  GLUCOSE 113*  BUN 14  CREATININE 0.62  CALCIUM 8.2*  Anti-infectives: Anti-infectives (From admission, onward)   Start     Dose/Rate Route Frequency Ordered Stop   10/29/18 1402  ceFAZolin (ANCEF) 2-4 GM/100ML-% IVPB    Note to Pharmacy: Carleene Cooper   : cabinet override      10/29/18 1402 10/30/18 0214   10/29/18 1145  cefOXitin (MEFOXIN) 2 g in sodium chloride 0.9 % 100 mL IVPB     2 g 200 mL/hr over 30 Minutes Intravenous On call to O.R. 10/29/18 1142 10/29/18 1411   10/29/18 0845  cefOXitin (MEFOXIN) 2 g in sodium chloride 0.9 % 100 mL IVPB     2 g 200 mL/hr over 30 Minutes Intravenous On call to O.R. 10/29/18 0835 10/29/18 1247      Assessment/Plan: s/p Procedure(s): XI ROBOTIC ASSISTED BILATERAL SALPINGO OOPHORECTOMY (Bilateral) LAPAROSCOPIC LYSIS OF ADHESIONS (N/A) ATTEMPTED LAPAROSCOPIC CONVERTED TO OPEN INCISIONAL HERNIA REPAIR WITH MESH (N/A) Advance diet  May be able to go home today if mobilizing and pain controlled.   LOS: 0 days     Ralene Ok 10/30/2018

## 2018-10-30 NOTE — Progress Notes (Signed)
Discharge instructions discussed with patient, verbalized agreement and understanding, return demonstration of JP drain care, including emptying and recording

## 2018-10-31 NOTE — Discharge Summary (Signed)
Physician Discharge Summary  Patient ID: Lisa Stanton MRN: [id number] DOB/AGE: [birth year] 53 y.o.  Admit date: 10/29/2018 Discharge date: 10/31/2018  Admission Diagnoses:recurrent incisional hernia  Discharge Diagnoses:  Principal Problem:   Incisional hernia Active Problems:   Class 2 severe obesity with serious comorbidity and body mass index (BMI) of 37.0 to 37.9 in adult [facility])   Right ovarian cyst   Elevated CA-125   Pelvic adhesive disease   Ovarian mass, right   S/P hernia repair   Discharged Condition: good  Hospital Course: patient underwent open incisional hernia.  Mesh.  Please see operative note for full details.  Postoperative patient was sent for.  She started a liquid diet and advance to regular diet which he was able to tolerate well.  Patient had good pain control.  She is ambulatory well on her own.  Patient was otherwise afebrile, detail for discharge and discharged home.  Consults: None  Significant Diagnostic Studies: none  Treatments: surgery: as above  Discharge Exam: Blood pressure (!) 141/64, pulse 92, temperature 97.7 F (36.5 C), temperature source Oral, resp. rate 17, height 5' 3.5" (1.613 m), weight 91.7 kg, last menstrual period 03/15/2009, SpO2 97 %. General appearance: alert and cooperative GI: soft, non-tender; bowel sounds normal; no masses,  no organomegaly and inc c d/i  Disposition:   Discharge Instructions    Diet - low sodium heart healthy   Complete by: As directed    Increase activity slowly   Complete by: As directed      Allergies as of 10/30/2018      Reactions   Benicar Hct [olmesartan Medoxomil-hctz] Shortness Of Breath   Lisinopril Other (See Comments)   Cough    Singulair [montelukast Sodium] Other (See Comments)   Cough    Crest [sodium Fluoride] Rash, Other (See Comments)   Redness      Medication List    TAKE these medications   Azilsartan-Chlorthalidone 40-12.5 MG Tabs Commonly known as: Edarbyclor Take  1 tablet by mouth daily.   cetirizine 10 MG tablet Commonly known as: ZYRTEC Take 10 mg by mouth at bedtime as needed for allergies.   cholecalciferol 25 MCG (1000 UT) tablet Commonly known as: VITAMIN D Take 1,000 Units by mouth at bedtime.   docusate sodium 100 MG capsule Commonly known as: Colace Take 1 capsule (100 mg total) by mouth 2 (two) times daily. What changed: when to take this   fluticasone 50 MCG/ACT nasal spray Commonly known as: FLONASE Place 2 sprays into both nostrils daily as needed for allergies or rhinitis.   ibuprofen 600 MG tablet Commonly known as: ADVIL Take 1 tablet (600 mg total) by mouth every 6 (six) hours as needed for moderate pain. For AFTER surgery   multivitamin with minerals Tabs tablet Take 1 tablet by mouth at bedtime.   omeprazole 20 MG capsule Commonly known as: PRILOSEC Take 20 mg by mouth daily before breakfast.   oxyCODONE 5 MG immediate release tablet Commonly known as: Oxy IR/ROXICODONE Take 1 tablet (5 mg total) by mouth every 4 (four) hours as needed for severe pain. For AFTER surgery, do not take and drive What changed: Another medication with the same name was added. Make sure you understand how and when to take each.   oxyCODONE 5 MG immediate release tablet Commonly known as: Oxy IR/ROXICODONE Take 1 tablet (5 mg total) by mouth every 4 (four) hours as needed for severe pain. What changed: You were already taking a medication with the same name,  and this prescription was added. Make sure you understand how and when to take each.   polyethylene glycol powder 17 GM/SCOOP powder Commonly known as: GLYCOLAX/MIRALAX Take 17 g by mouth daily as needed (constipation).   pravastatin 80 MG tablet Commonly known as: PRAVACHOL Take 1 tablet (80 mg total) by mouth daily. What changed: when to take this   senna-docusate 8.6-50 MG tablet Commonly known as: Senokot-S Take 2 tablets by mouth at bedtime. For AFTER surgery, do not  take if having loose stools      Follow-up Information    Ralene Ok, MD. Schedule an appointment as soon as possible for a visit in 2 weeks.   Specialty: General Surgery Why: Post op visit Contact information: [place] [address] [phone]           Signed: Ralene Ok 10/31/2018, 8:35 AM

## 2018-11-05 ENCOUNTER — Telehealth: Payer: Self-pay | Admitting: *Deleted

## 2018-11-05 NOTE — Telephone Encounter (Signed)
Alex from the patient's insurance/disability group yesterday called wanted information. Returned the call today and left a message to call the office back.

## 2018-11-19 ENCOUNTER — Encounter: Payer: Self-pay | Admitting: Gynecologic Oncology

## 2018-11-20 ENCOUNTER — Telehealth: Payer: Self-pay | Admitting: *Deleted

## 2018-11-20 NOTE — Telephone Encounter (Signed)
Per patient request moved her appt Friday to the morning

## 2018-11-22 ENCOUNTER — Inpatient Hospital Stay: Payer: BC Managed Care – PPO | Attending: Gynecologic Oncology | Admitting: Gynecologic Oncology

## 2018-11-22 ENCOUNTER — Encounter: Payer: Self-pay | Admitting: Gynecologic Oncology

## 2018-11-22 ENCOUNTER — Other Ambulatory Visit: Payer: Self-pay

## 2018-11-22 ENCOUNTER — Ambulatory Visit: Payer: BLUE CROSS/BLUE SHIELD | Admitting: Gynecologic Oncology

## 2018-11-22 VITALS — BP 104/64 | HR 68 | Temp 99.1°F | Resp 18 | Ht 63.5 in | Wt 195.5 lb

## 2018-11-22 DIAGNOSIS — N809 Endometriosis, unspecified: Secondary | ICD-10-CM | POA: Diagnosis not present

## 2018-11-22 DIAGNOSIS — Z09 Encounter for follow-up examination after completed treatment for conditions other than malignant neoplasm: Secondary | ICD-10-CM

## 2018-11-22 DIAGNOSIS — N838 Other noninflammatory disorders of ovary, fallopian tube and broad ligament: Secondary | ICD-10-CM

## 2018-11-22 DIAGNOSIS — Z90722 Acquired absence of ovaries, bilateral: Secondary | ICD-10-CM | POA: Diagnosis not present

## 2018-11-22 NOTE — Progress Notes (Signed)
Follow-up Note: Gyn-Onc  Consult was initially requested by Lisa Stanton for the evaluation of Lisa Stanton 53 y.o. female  CC:  Chief Complaint  Patient presents with  . Routine Post Op    endometrioma    Assessment/Plan:  [name]  is a 53 y.o.  year old with a history of right ovarian complex cyst in the setting of an elevated CA 125, large ventral hernia who is s/p robotic assisted BSO and open hernia repair with mesh on 10/29/18. The pathology was benign (endoemtrioma of the right ovary).  She is doing well postop with no issues.  I recommend follow-up with Lisa Lisa Stanton for routine gynecologic care.    HPI: [name] is a 53 year old P0 who is seen in consultation at the request of Lisa Stanton for a right ovarian complex cystic mass and elevated CA 125.   The patient reports having at least 10 years history of intermittent menstrual cramp-like pain.  She reports that out of the blue in March 2020 this became a more constant pain.  Ibuprofen helped a little but was also associated with some hip pain.  As response to developing this pain symptoms she was evaluated by her gynecologist who performed a transvaginal ultrasound on July 30, 2018.  This revealed a normal left ovary measuring 1.9 x 1.5 x 1.4 cm.  The uterus was surgically absent.  The right ovary measured 6.98 x 1.79 x 4.98 cm.  It contained 2 ovarian cyst 1 that was slightly larger at 6 cm and the other at approximately 4 cm.  The sick centimeters cyst appeared consistent with a large endometrioma.  It was thick-walled with Doppler around the periphery.  There was a solid-appearing mass with Doppler flow closest to the vaginal cuff measuring 3.8 cm.  Ca1 25 was drawn on July 31, 2018 and was elevated at 160 (upper limit of normal 38).  Patient has a complex past surgical history.  This includes an appendectomy with abdominoplasty in 2004.  Prior to that she had a cholecystectomy in 2003.  And prior  to that in 2002 had had a gastric bypass performed laparoscopically but then complicated postoperatively by intra-abdominal bleed for which she underwent an emergent laparotomy with repair.  The patient developed a ventral hernia which was repaired and then subsequently underwent a laparoscopic hysterectomy in 2010.  Endometriosis was identified on pathology.  The patient reports that this hysterectomy was performed due to heavy bleeding.  Subsequent to having hysterectomy she then underwent an laparoscopic ventral hernia repair with mesh at [facility].  The surgery was uncomplicated.  However following that surgery she developed recurrence of the hernia.  The patient is obese with a BMI of 37 kg/m. She has a past medical history that is significant for prediabetes, hypertension and hypercholesterolemia.  Family history significant for father with history of pancreatic cancer, mother with history of melanoma, a sister and brother both who had thyroid cancer, a maternal grandfather with history of colon cancer, maternal grandmother with history of breast cancer in her 21s.  Interval Hx:   On 10/29/18 she underwent robotic assisted lysis of adhesions and BSO with Lisa Stanton performing open ventral hernia repair with mesh. Intraoperative findings were significant for a 15cm ventral hernia. Pelvic adhesions between sigmoid colon and right ovarian mass measuring 8cm which was filled with chocolate colored fluid. It was densely adherent to the sigmoid colon, vaginal cuff and sidewall. Frozen section and permanent pathology confirmed a benign endometrioma.  Postoperatively [id number]   Current Meds:  Outpatient Encounter Medications as of 11/22/2018  Medication Sig  . Azilsartan-Chlorthalidone (EDARBYCLOR) 40-12.5 MG TABS Take 1 tablet by mouth daily.  . cetirizine (ZYRTEC) 10 MG tablet Take 10 mg by mouth at bedtime as needed for allergies.   . cholecalciferol (VITAMIN D) 25 MCG (1000 UT) tablet Take 1,000  Units by mouth at bedtime.  . docusate sodium (COLACE) 100 MG capsule Take 1 capsule (100 mg total) by mouth 2 (two) times daily. (Patient taking differently: Take 100 mg by mouth at bedtime. )  . fluticasone (FLONASE) 50 MCG/ACT nasal spray Place 2 sprays into both nostrils daily as needed for allergies or rhinitis.   [name] ibuprofen (ADVIL) 600 MG tablet Take 1 tablet (600 mg total) by mouth every 6 (six) hours as needed for moderate pain. For AFTER surgery  . Multiple Vitamin (MULTIVITAMIN WITH MINERALS) TABS tablet Take 1 tablet by mouth at bedtime.  [name] omeprazole (PRILOSEC) 20 MG capsule Take 20 mg by mouth daily before breakfast.   . oxyCODONE (OXY IR/ROXICODONE) 5 MG immediate release tablet Take 1 tablet (5 mg total) by mouth every 4 (four) hours as needed for severe pain. For AFTER surgery, do not take and drive  . oxyCODONE (OXY IR/ROXICODONE) 5 MG immediate release tablet Take 1 tablet (5 mg total) by mouth every 4 (four) hours as needed for severe pain.  . polyethylene glycol powder (GLYCOLAX/MIRALAX) 17 GM/SCOOP powder Take 17 g by mouth daily as needed (constipation).  . pravastatin (PRAVACHOL) 80 MG tablet Take 1 tablet (80 mg total) by mouth daily. (Patient taking differently: Take 80 mg by mouth every evening. )  . senna-docusate (SENOKOT-S) 8.6-50 MG tablet Take 2 tablets by mouth at bedtime. For AFTER surgery, do not take if having loose stools   No facility-administered encounter medications on file as of 11/22/2018.     Allergy:  Allergies  Allergen Reactions  . Benicar Hct [Olmesartan Medoxomil-Hctz] Shortness Of Breath  . Lisinopril Other (See Comments)    Cough   . Singulair [Montelukast Sodium] Other (See Comments)    Cough   . Crest [Sodium Fluoride] Rash and Other (See Comments)    Redness    Social Hx:   Social History   Socioeconomic History  . Marital status: Married    Spouse name: Lisa Stanton  . Number of children: 0  . Years of education: HS  . Highest  education level: Not on file  Occupational History  . Occupation: [employer]: [employer]  . Financial resource strain: Not on file  . Food insecurity    Worry: Not on file    Inability: Not on file  . Transportation needs    Medical: Not on file    Non-medical: Not on file  Tobacco Use  . Smoking status: Never Smoker  . Smokeless tobacco: Never Used  Substance and Sexual Activity  . Alcohol use: Yes    Comment: 6 times/year  . Drug use: No  . Sexual activity: Yes    Partners: Male    Birth control/protection: Surgical  Lifestyle  . Physical activity    Days per week: Not on file    Minutes per session: Not on file  . Stress: Not on file  Relationships  . Social Herbalist on phone: Not on file    Gets together: Not on file    Attends religious service: Not on file    Active  member of club or organization: Not on file    Attends meetings of clubs or organizations: Not on file    Relationship status: Not on file  . Intimate partner violence    Fear of current or ex partner: Not on file    Emotionally abused: Not on file    Physically abused: Not on file    Forced sexual activity: Not on file  Other Topics Concern  . Not on file  Social History Narrative   2 caffeine per day. Regular exercise, walking.      Past Surgical Hx:  Past Surgical History:  Procedure Laterality Date  . ABDOMINAL HERNIA REPAIR  09/2010   incarcerated that required surgery.    . ABDOMINAL HYSTERECTOMY     partial  . ABDOMINOPLASTY  2006  . APPENDECTOMY  February 2004  . BIOPSY  10/02/2018   Procedure: BIOPSY;  Surgeon: Yetta Flock, MD;  Location: [facility];  Service: Gastroenterology;;  . Wilmon Pali RELEASE  May 1998   Right hand  . CHOLECYSTECTOMY  February 2003  . COLONOSCOPY    . Cysts removed  September 1993   Left hand  . ESOPHAGOGASTRODUODENOSCOPY (EGD) WITH PROPOFOL N/A 10/02/2018   Procedure: ESOPHAGOGASTRODUODENOSCOPY (EGD)  WITH PROPOFOL;  Surgeon: Yetta Flock, MD;  Location: [facility];  Service: Gastroenterology;  Laterality: N/A;  . KNEE SURGERY  July 1986   Left knee  . LAPAROSCOPIC LYSIS OF ADHESIONS N/A 10/29/2018   Procedure: LAPAROSCOPIC LYSIS OF ADHESIONS;  Surgeon: Everitt Amber, MD;  Location: [facility];  Service: Gynecology;  Laterality: N/A;  . PARTIAL HYSTERECTOMY  November 2010  . ROBOTIC ASSISTED SALPINGO OOPHERECTOMY Bilateral 10/29/2018   Procedure: XI ROBOTIC ASSISTED BILATERAL SALPINGO OOPHORECTOMY;  Surgeon: Everitt Amber, MD;  Location: [facility];  Service: Gynecology;  Laterality: Bilateral;  . ROUX-EN-Y GASTRIC BYPASS  06/2000  . UMBILICAL HERNIA REPAIR    . WISDOM TOOTH EXTRACTION    . XI ROBOTIC ASSISTED INGUINAL HERNIA REPAIR WITH MESH N/A 10/29/2018   Procedure: ATTEMPTED LAPAROSCOPIC CONVERTED TO OPEN INCISIONAL HERNIA REPAIR WITH MESH;  Surgeon: Ralene Ok, MD;  Location: [facility];  Service: General;  Laterality: N/A;    Past Medical Hx:  Past Medical History:  Diagnosis Date  . Anemia   . Arthritis   . Back pain   . Complication of anesthesia    woke up during 1986 left knee surgery  . Constipation   . Dyspnea    caused by Benicar, no other issue at this time  . Fatty liver   . GERD (gastroesophageal reflux disease)   . Hip pain   . History of abdominal hernia   . History of carpal tunnel syndrome    Bilateral  . History of gallstones   . History of umbilical hernia   . Hyperlipidemia   . Hypertension   . Iron deficiency anemia   . Joint pain   . Knee pain   . Lower leg edema   . Murmur   . Pneumonia    9th grade  . PONV (postoperative nausea and vomiting)   . Thyroid nodule    right side  . Vitamin B 12 deficiency    resolved    Past Gynecological History:  See HPI Patient's last menstrual period was 03/15/2009.  Family Hx:  Family History  Problem Relation Age of Onset  . Osteoporosis Mother   . Rheum arthritis Mother   . Kidney disease Mother    . Thyroid disease Mother   .  Obesity Mother   . Breast cancer Maternal Grandmother   . Colon cancer Maternal Grandfather   . Thyroid cancer Brother 45  . Cancer Brother        thyroid  . Diabetes Father   . Hypertension Father   . Hyperlipidemia Father   . Cancer Father        pancreatic  . Obesity Father   . Diabetes Paternal Grandfather   . Heart disease Paternal Grandfather   . Cancer Sister        thyroid    Review of Systems:  Constitutional  Feels well,   + hot flashes and sleep disturbance.  ENT Normal appearing ears and nares bilaterally Skin/Breast  No rash, sores, jaundice, itching, dryness Cardiovascular  No chest pain, shortness of breath, or edema  Pulmonary  No cough or wheeze.  Gastro Intestinal  No nausea, vomitting, or diarrhoea. No bright red blood per rectum, no abdominal pain, change in bowel movement, or constipation Genito Urinary  No frequency, urgency, dysuria,  Musculo Skeletal  No myalgia, arthralgia, joint swelling or pain  Neurologic  No weakness, numbness, change in gait,  Psychology  No depression, anxiety, insomnia.   Vitals:  Blood pressure 104/64, pulse 68, temperature 99.1 F (37.3 C), temperature source Oral, resp. rate 18, height 5' 3.5" (1.613 m), weight 195 lb 8 oz (88.7 kg), last menstrual period 03/15/2009, SpO2 100 %.  Physical Exam: WD in NAD Neck  Supple NROM, without any enlargements.  Lymph Node Survey No cervical supraclavicular or inguinal adenopathy Cardiovascular  Pulse normal rate, regularity and rhythm. S1 and S2 normal.  Lungs  Clear to auscultation bilateraly, without wheezes/crackles/rhonchi. Good air movement.  Skin  No rash/lesions/breakdown  Psychiatry  Alert and oriented to person, place, and time  Abdomen  Normoactive bowel sounds, abdomen soft, non-tender and obese. Incisions well healed Back No CVA tenderness Genito Urinary  deferred Rectal  deferred  Extremities  No bilateral cyanosis,  clubbing or edema.   Thereasa Solo, MD  11/22/2018, 8:55 AM

## 2018-11-22 NOTE — Patient Instructions (Addendum)
Dr Denman George is clearing you to return to work from home on 11/25/18 for 5 hours per day.   If your hot flashes get worse, please discuss hormone replacement therapy options with Dr Oda Kilts.  Melatonin over the counter can be used as a sleep aid.

## 2018-12-09 ENCOUNTER — Encounter (INDEPENDENT_AMBULATORY_CARE_PROVIDER_SITE_OTHER): Payer: Self-pay | Admitting: Family Medicine

## 2018-12-10 NOTE — Telephone Encounter (Signed)
Please advise 

## 2018-12-16 ENCOUNTER — Encounter (INDEPENDENT_AMBULATORY_CARE_PROVIDER_SITE_OTHER): Payer: Self-pay | Admitting: Family Medicine

## 2018-12-16 ENCOUNTER — Ambulatory Visit (INDEPENDENT_AMBULATORY_CARE_PROVIDER_SITE_OTHER): Payer: BC Managed Care – PPO | Admitting: Family Medicine

## 2018-12-16 ENCOUNTER — Other Ambulatory Visit: Payer: Self-pay

## 2018-12-16 VITALS — BP 104/69 | HR 71 | Temp 97.8°F | Ht 63.0 in | Wt 193.0 lb

## 2018-12-16 DIAGNOSIS — E559 Vitamin D deficiency, unspecified: Secondary | ICD-10-CM | POA: Diagnosis not present

## 2018-12-16 DIAGNOSIS — R7303 Prediabetes: Secondary | ICD-10-CM | POA: Diagnosis not present

## 2018-12-16 DIAGNOSIS — Z6834 Body mass index (BMI) 34.0-34.9, adult: Secondary | ICD-10-CM

## 2018-12-16 DIAGNOSIS — I10 Essential (primary) hypertension: Secondary | ICD-10-CM | POA: Diagnosis not present

## 2018-12-16 DIAGNOSIS — Z9189 Other specified personal risk factors, not elsewhere classified: Secondary | ICD-10-CM | POA: Diagnosis not present

## 2018-12-16 DIAGNOSIS — E669 Obesity, unspecified: Secondary | ICD-10-CM

## 2018-12-16 NOTE — Progress Notes (Signed)
Office: [phone]  /  Fax: [phone]   HPI:   Chief Complaint: OBESITY Lisa Stanton is here to discuss her progress with her obesity treatment plan. She is on the Category 3 plan and is following her eating plan approximately 80 % of the time. She states she is exercising 0 minutes 0 times per week. Lisa Stanton's last visit was 5 months ago. She has actually done well with weight loss during the pandemic. She is tolerating her Category 3 plan well and her hunger is controlled.  Her weight is 193 lb (87.5 kg) today and has had a weight loss of 19 pounds over a period of 4 to 5 months since her last visit. She has lost 28 lbs since starting treatment with [name].  Hypertension Lisa Stanton is a 53 y.o. female with hypertension. Lisa Stanton's blood pressure is stable on medications. She denies chest pain, headaches, or dizziness. She is doing well on her diet prescription. She is working on weight loss to help control her blood pressure with the goal of decreasing her risk of heart attack and stroke. Lisa Stanton's blood pressure is currently controlled.  Vitamin D Deficiency Lisa Stanton has a diagnosis of vitamin D deficiency. She is currently taking OTC Vit D 1,000 IU daily. She is due for labs and denies nausea, vomiting or muscle weakness.  Pre-Diabetes Lisa Stanton has a diagnosis of pre-diabetes based on her elevated Hgb A1c and was informed this puts her at greater risk of developing diabetes. She is not taking metformin currently. She is stable on diet prescription and notes decreased polyphagia. She denies nausea, vomiting, or hypoglycemia and she is due for labs.  At risk for diabetes Lisa Stanton is at higher than averagerisk for developing diabetes due to her obesity. She currently denies polyuria or polydipsia.  ASSESSMENT AND PLAN:  Essential hypertension - Plan: Comprehensive metabolic panel, CBC With Differential, Lipid Panel With LDL/HDL Ratio  Vitamin D deficiency - Plan: VITAMIN D 25  Hydroxy (Vit-D Deficiency, Fractures)  Prediabetes - Plan: Hemoglobin A1c, Insulin, random, Lipid Panel With LDL/HDL Ratio  Class 1 obesity with serious comorbidity and body mass index (BMI) of 34.0 to 34.9 in adult, unspecified obesity type  PLAN:  Hypertension We discussed sodium restriction, working on healthy weight loss, and a regular exercise program as the means to achieve improved blood pressure control. Lisa Stanton agreed with this plan and agreed to follow up as directed. We will continue to monitor her blood pressure as well as her progress with the above lifestyle modifications. She will continue her diet and current medications, and will watch for signs of hypotension as she continues her lifestyle modifications. We will check labs today. Montia agrees to follow up with our clinic in 2 weeks.  Vitamin D Deficiency Lisa Stanton was informed that low vitamin D levels contributes to fatigue and are associated with obesity, breast, and colon cancer. Lisa Stanton agrees to continue taking OTC Vit D 1,000 IU daily and will follow up for routine testing of vitamin D, at least 2-3 times per year. She was informed of the risk of over-replacement of vitamin D and agrees to not increase her dose unless she discusses this with [name] first. We will check labs today. Lisa Stanton agrees to follow up with our clinic in 2 weeks.  Pre-Diabetes Lisa Stanton will continue her diet prescription, and will continue to work on weight loss, exercise, and decreasing simple carbohydrates in her diet to help decrease the risk of diabetes. We dicussed metformin including benefits and risks. She was informed that  eating too many simple carbohydrates or too many calories at one sitting increases the likelihood of GI side effects. We will check labs today. Lisa Stanton agrees to follow up with our clinic in 2 weeks as directed to monitor her progress.  Diabetes risk counselling Lisa Stanton was given extended (15 minutes) diabetes  prevention counseling today. She is 53 y.o. female and has risk factors for diabetes including obesity. We discussed intensive lifestyle modifications today with an emphasis on weight loss as well as increasing exercise and decreasing simple carbohydrates in her diet.  Obesity Lisa Stanton is currently in the action stage of change. As such, her goal is to continue with weight loss efforts She has agreed to follow the Category 3 plan Lisa Stanton has been instructed to work up to a goal of 150 minutes of combined cardio and strengthening exercise per week for weight loss and overall health benefits. We discussed the following Behavioral Modification Strategies today: increasing lean protein intake and decreasing simple carbohydrates    Lisa Stanton has agreed to follow up with our clinic in 2 weeks. She was informed of the importance of frequent follow up visits to maximize her success with intensive lifestyle modifications for her multiple health conditions.  ALLERGIES: Allergies  Allergen Reactions  . Benicar Hct [Olmesartan Medoxomil-Hctz] Shortness Of Breath  . Lisinopril Other (See Comments)    Cough   . Singulair [Montelukast Sodium] Other (See Comments)    Cough   . Crest [Sodium Fluoride] Rash and Other (See Comments)    Redness    MEDICATIONS: Current Outpatient Medications on File Prior to Visit  Medication Sig Dispense Refill  . Azilsartan-Chlorthalidone (EDARBYCLOR) 40-12.5 MG TABS Take 1 tablet by mouth daily. 90 tablet 3  . cetirizine (ZYRTEC) 10 MG tablet Take 10 mg by mouth at bedtime as needed for allergies.     . cholecalciferol (VITAMIN D) 25 MCG (1000 UT) tablet Take 1,000 Units by mouth at bedtime.    . docusate sodium (COLACE) 100 MG capsule Take 1 capsule (100 mg total) by mouth 2 (two) times daily. (Patient taking differently: Take 100 mg by mouth at bedtime. ) 60 capsule 0  . fluticasone (FLONASE) 50 MCG/ACT nasal spray Place 2 sprays into both nostrils daily as needed  for allergies or rhinitis.     [name] ibuprofen (ADVIL) 600 MG tablet Take 1 tablet (600 mg total) by mouth every 6 (six) hours as needed for moderate pain. For AFTER surgery 30 tablet 0  . Multiple Vitamin (MULTIVITAMIN WITH MINERALS) TABS tablet Take 1 tablet by mouth at bedtime.    [name] omeprazole (PRILOSEC) 20 MG capsule Take 20 mg by mouth daily before breakfast.     . polyethylene glycol powder (GLYCOLAX/MIRALAX) 17 GM/SCOOP powder Take 17 g by mouth daily as needed (constipation).    . pravastatin (PRAVACHOL) 80 MG tablet Take 1 tablet (80 mg total) by mouth daily. (Patient taking differently: Take 80 mg by mouth every evening. ) 90 tablet 3   No current facility-administered medications on file prior to visit.     PAST MEDICAL HISTORY: Past Medical History:  Diagnosis Date  . Anemia   . Arthritis   . Back pain   . Complication of anesthesia    woke up during 1986 left knee surgery  . Constipation   . Dyspnea    caused by Benicar, no other issue at this time  . Fatty liver   . GERD (gastroesophageal reflux disease)   . Hip pain   . History of  abdominal hernia   . History of carpal tunnel syndrome    Bilateral  . History of gallstones   . History of umbilical hernia   . Hyperlipidemia   . Hypertension   . Iron deficiency anemia   . Joint pain   . Knee pain   . Lower leg edema   . Murmur   . Pneumonia    9th grade  . PONV (postoperative nausea and vomiting)   . Thyroid nodule    right side  . Vitamin B 12 deficiency    resolved    PAST SURGICAL HISTORY: Past Surgical History:  Procedure Laterality Date  . ABDOMINAL HERNIA REPAIR  09/2010   incarcerated that required surgery.    . ABDOMINAL HYSTERECTOMY     partial  . ABDOMINOPLASTY  2006  . APPENDECTOMY  February 2004  . BIOPSY  10/02/2018   Procedure: BIOPSY;  Surgeon: Yetta Flock, MD;  Location: [facility];  Service: Gastroenterology;;  . Wilmon Pali RELEASE  May 1998   Right hand  . CHOLECYSTECTOMY   February 2003  . COLONOSCOPY    . Cysts removed  September 1993   Left hand  . ESOPHAGOGASTRODUODENOSCOPY (EGD) WITH PROPOFOL N/A 10/02/2018   Procedure: ESOPHAGOGASTRODUODENOSCOPY (EGD) WITH PROPOFOL;  Surgeon: Yetta Flock, MD;  Location: [facility];  Service: Gastroenterology;  Laterality: N/A;  . KNEE SURGERY  July 1986   Left knee  . LAPAROSCOPIC LYSIS OF ADHESIONS N/A 10/29/2018   Procedure: LAPAROSCOPIC LYSIS OF ADHESIONS;  Surgeon: Everitt Amber, MD;  Location: [facility];  Service: Gynecology;  Laterality: N/A;  . PARTIAL HYSTERECTOMY  November 2010  . ROBOTIC ASSISTED SALPINGO OOPHERECTOMY Bilateral 10/29/2018   Procedure: XI ROBOTIC ASSISTED BILATERAL SALPINGO OOPHORECTOMY;  Surgeon: Everitt Amber, MD;  Location: [facility];  Service: Gynecology;  Laterality: Bilateral;  . ROUX-EN-Y GASTRIC BYPASS  06/2000  . UMBILICAL HERNIA REPAIR    . WISDOM TOOTH EXTRACTION    . XI ROBOTIC ASSISTED INGUINAL HERNIA REPAIR WITH MESH N/A 10/29/2018   Procedure: ATTEMPTED LAPAROSCOPIC CONVERTED TO OPEN INCISIONAL HERNIA REPAIR WITH MESH;  Surgeon: Ralene Ok, MD;  Location: [facility];  Service: General;  Laterality: N/A;    SOCIAL HISTORY: Social History   Tobacco Use  . Smoking status: Never Smoker  . Smokeless tobacco: Never Used  Substance Use Topics  . Alcohol use: Yes    Comment: 6 times/year  . Drug use: No    FAMILY HISTORY: Family History  Problem Relation Age of Onset  . Osteoporosis Mother   . Rheum arthritis Mother   . Kidney disease Mother   . Thyroid disease Mother   . Obesity Mother   . Breast cancer Maternal Grandmother   . Colon cancer Maternal Grandfather   . Thyroid cancer Brother 82  . Cancer Brother        thyroid  . Diabetes Father   . Hypertension Father   . Hyperlipidemia Father   . Cancer Father        pancreatic  . Obesity Father   . Diabetes Paternal Grandfather   . Heart disease Paternal Grandfather   . Cancer Sister        thyroid    ROS:  Review of Systems  Constitutional: Positive for weight loss.  Cardiovascular: Negative for chest pain.  Gastrointestinal: Negative for nausea and vomiting.  Musculoskeletal:       Negative muscle weakness  Neurological: Negative for dizziness and headaches.  Endo/Heme/Allergies:       Negative  hypoglycemia Positive polyphagia    PHYSICAL EXAM: Blood pressure 104/69, pulse 71, temperature 97.8 F (36.6 C), temperature source Oral, height 5\' 3"  (1.6 m), weight 193 lb (87.5 kg), last menstrual period 03/15/2009, SpO2 97 %. Body mass index is 34.19 kg/m. Physical Exam Vitals signs reviewed.  Constitutional:      Appearance: Normal appearance. She is obese.  Cardiovascular:     Rate and Rhythm: Normal rate.     Pulses: Normal pulses.  Pulmonary:     Effort: Pulmonary effort is normal.     Breath sounds: Normal breath sounds.  Musculoskeletal: Normal range of motion.  Skin:    General: Skin is warm and dry.  Neurological:     Mental Status: She is alert and oriented to person, place, and time.  Psychiatric:        Mood and Affect: Mood normal.        Behavior: Behavior normal.     RECENT LABS AND TESTS: BMET    Component Value Date/Time   NA 138 10/30/2018 0404   NA 139 06/10/2018 0956   K 3.6 10/30/2018 0404   CL 104 10/30/2018 0404   CL 97 06/26/2014   CO2 25 10/30/2018 0404   GLUCOSE 113 (H) 10/30/2018 0404   BUN 14 10/30/2018 0404   BUN 10 06/10/2018 0956   CREATININE 0.62 10/30/2018 0404   CREATININE 0.65 02/05/2018 0913   CALCIUM 8.2 (L) 10/30/2018 0404   CALCIUM 10.4 01/26/2015   CALCIUM 9.1 06/26/2014   GFRNONAA >60 10/30/2018 0404   GFRNONAA 103 02/05/2018 0913   GFRAA >60 10/30/2018 0404   GFRAA 119 02/05/2018 0913   Lab Results  Component Value Date   HGBA1C 5.8 (H) 06/10/2018   HGBA1C 6.1 (H) 08/22/2017   HGBA1C 5.9 (H) 02/12/2017   HGBA1C 6.1 (H) 01/25/2016   HGBA1C 6.4 02/23/2015   Lab Results  Component Value Date   INSULIN 24.1  06/10/2018   CBC    Component Value Date/Time   WBC 9.1 10/30/2018 0404   RBC 3.36 (L) 10/30/2018 0404   HGB 10.2 (L) 10/30/2018 0404   HGB 12.5 06/10/2018 0956   HCT 31.8 (L) 10/30/2018 0404   HCT 37.7 06/10/2018 0956   PLT 260 10/30/2018 0404   MCV 94.6 10/30/2018 0404   MCV 89 06/10/2018 0956   MCH 30.4 10/30/2018 0404   MCHC 32.1 10/30/2018 0404   RDW 15.2 10/30/2018 0404   RDW 14.5 06/10/2018 0956   LYMPHSABS 3.4 (H) 06/10/2018 0956   MONOABS 522 01/25/2016 0919   EOSABS 0.1 06/10/2018 0956   BASOSABS 0.1 06/10/2018 0956   Iron/TIBC/Ferritin/ %Sat    Component Value Date/Time   IRON 96 06/10/2018 0956   TIBC 353 06/10/2018 0956   FERRITIN 62 06/10/2018 0956   IRONPCTSAT 27 06/10/2018 0956   Lipid Panel     Component Value Date/Time   CHOL 189 06/10/2018 0956   TRIG 139 06/10/2018 0956   HDL 53 06/10/2018 0956   CHOLHDL 4.0 02/05/2018 0913   VLDL 28 01/25/2016 0919   LDLCALC 108 (H) 06/10/2018 0956   LDLCALC 103 (H) 02/05/2018 0913   Hepatic Function Panel     Component Value Date/Time   PROT 8.1 10/25/2018 1100   PROT 6.8 06/10/2018 0956   ALBUMIN 4.2 10/25/2018 1100   ALBUMIN 4.3 06/10/2018 0956   AST 23 10/25/2018 1100   ALT 30 10/25/2018 1100   ALKPHOS 98 10/25/2018 1100   BILITOT 0.3 10/25/2018 1100   BILITOT 0.4  06/10/2018 0956   BILIDIR 0.1 01/28/2014 0914   IBILI 0.3 01/28/2014 0914      Component Value Date/Time   TSH 2.110 06/10/2018 0956   TSH 1.78 02/05/2018 0913   TSH 2.29 02/12/2017 0747      OBESITY BEHAVIORAL INTERVENTION VISIT  Today's visit was # 7   Starting weight: 221 lbs Starting date: 06/10/2018 Today's weight : 193 lbs Today's date: 12/16/2018 Total lbs lost to date: 104    ASK: We discussed the diagnosis of obesity with Lisa Stanton today and Lisa Stanton agreed to give [name] permission to discuss obesity behavioral modification therapy today.  ASSESS: Lisa Stanton has the diagnosis of obesity and her BMI today is  34.2 Lisa Stanton is in the action stage of change   ADVISE: Lisa Stanton was educated on the multiple health risks of obesity as well as the benefit of weight loss to improve her health. She was advised of the need for long term treatment and the importance of lifestyle modifications to improve her current health and to decrease her risk of future health problems.  AGREE: Multiple dietary modification options and treatment options were discussed and  Lisa Stanton agreed to follow the recommendations documented in the above note.  ARRANGE: Lisa Stanton was educated on the importance of frequent visits to treat obesity as outlined per CMS and USPSTF guidelines and agreed to schedule her next follow up appointment today.  I, Trixie Dredge, am acting as transcriptionist for Dennard Nip, MD I have reviewed the above documentation for accuracy and completeness, and I agree with the above. -Dennard Nip, MD

## 2018-12-17 LAB — CBC WITH DIFFERENTIAL
Basophils Absolute: 0.1 10*3/uL (ref 0.0–0.2)
Basos: 1 %
EOS (ABSOLUTE): 0.1 10*3/uL (ref 0.0–0.4)
Eos: 1 %
Hematocrit: 38.9 % (ref 34.0–46.6)
Hemoglobin: 12.3 g/dL (ref 11.1–15.9)
Immature Grans (Abs): 0 10*3/uL (ref 0.0–0.1)
Immature Granulocytes: 0 %
Lymphocytes Absolute: 2.4 10*3/uL (ref 0.7–3.1)
Lymphs: 33 %
MCH: 28.8 pg (ref 26.6–33.0)
MCHC: 31.6 g/dL (ref 31.5–35.7)
MCV: 91 fL (ref 79–97)
Monocytes Absolute: 0.5 10*3/uL (ref 0.1–0.9)
Monocytes: 7 %
Neutrophils Absolute: 4.3 10*3/uL (ref 1.4–7.0)
Neutrophils: 58 %
RBC: 4.27 x10E6/uL (ref 3.77–5.28)
RDW: 13.6 % (ref 11.7–15.4)
WBC: 7.4 10*3/uL (ref 3.4–10.8)

## 2018-12-17 LAB — COMPREHENSIVE METABOLIC PANEL
ALT: 24 IU/L (ref 0–32)
AST: 22 IU/L (ref 0–40)
Albumin/Globulin Ratio: 1.8 (ref 1.2–2.2)
Albumin: 4.4 g/dL (ref 3.8–4.9)
Alkaline Phosphatase: 105 IU/L (ref 39–117)
BUN/Creatinine Ratio: 28 — ABNORMAL HIGH (ref 9–23)
BUN: 16 mg/dL (ref 6–24)
Bilirubin Total: 0.2 mg/dL (ref 0.0–1.2)
CO2: 26 mmol/L (ref 20–29)
Calcium: 9.9 mg/dL (ref 8.7–10.2)
Chloride: 102 mmol/L (ref 96–106)
Creatinine, Ser: 0.57 mg/dL (ref 0.57–1.00)
GFR calc Af Amer: 123 mL/min/{1.73_m2} (ref >59)
GFR calc non Af Amer: 107 mL/min/{1.73_m2} (ref >59)
Globulin, Total: 2.5 g/dL (ref 1.5–4.5)
Glucose: 95 mg/dL (ref 65–99)
Potassium: 4 mmol/L (ref 3.5–5.2)
Sodium: 144 mmol/L (ref 134–144)
Total Protein: 6.9 g/dL (ref 6.0–8.5)

## 2018-12-17 LAB — INSULIN, RANDOM: INSULIN: 23.1 u[IU]/mL (ref 2.6–24.9)

## 2018-12-17 LAB — LIPID PANEL WITH LDL/HDL RATIO
Cholesterol, Total: 163 mg/dL (ref 100–199)
HDL: 44 mg/dL (ref >39)
LDL Calculated: 88 mg/dL (ref 0–99)
LDl/HDL Ratio: 2 ratio (ref 0.0–3.2)
Triglycerides: 153 mg/dL — ABNORMAL HIGH (ref 0–149)
VLDL Cholesterol Cal: 31 mg/dL (ref 5–40)

## 2018-12-17 LAB — HEMOGLOBIN A1C
Est. average glucose Bld gHb Est-mCnc: 108 mg/dL
Hgb A1c MFr Bld: 5.4 % (ref 4.8–5.6)

## 2018-12-17 LAB — VITAMIN D 25 HYDROXY (VIT D DEFICIENCY, FRACTURES): Vit D, 25-Hydroxy: 50.3 ng/mL (ref 30.0–100.0)

## 2018-12-25 ENCOUNTER — Other Ambulatory Visit: Payer: Self-pay | Admitting: Physician Assistant

## 2018-12-25 DIAGNOSIS — Z1231 Encounter for screening mammogram for malignant neoplasm of breast: Secondary | ICD-10-CM

## 2018-12-26 ENCOUNTER — Other Ambulatory Visit: Payer: Self-pay

## 2018-12-26 ENCOUNTER — Telehealth (INDEPENDENT_AMBULATORY_CARE_PROVIDER_SITE_OTHER): Payer: BC Managed Care – PPO | Admitting: Family Medicine

## 2018-12-26 ENCOUNTER — Encounter (INDEPENDENT_AMBULATORY_CARE_PROVIDER_SITE_OTHER): Payer: Self-pay | Admitting: Family Medicine

## 2018-12-26 DIAGNOSIS — Z6834 Body mass index (BMI) 34.0-34.9, adult: Secondary | ICD-10-CM | POA: Diagnosis not present

## 2018-12-26 DIAGNOSIS — R7303 Prediabetes: Secondary | ICD-10-CM

## 2018-12-26 DIAGNOSIS — E860 Dehydration: Secondary | ICD-10-CM | POA: Diagnosis not present

## 2018-12-26 DIAGNOSIS — E669 Obesity, unspecified: Secondary | ICD-10-CM

## 2019-01-01 ENCOUNTER — Ambulatory Visit (INDEPENDENT_AMBULATORY_CARE_PROVIDER_SITE_OTHER): Payer: BC Managed Care – PPO

## 2019-01-01 ENCOUNTER — Other Ambulatory Visit: Payer: Self-pay

## 2019-01-01 DIAGNOSIS — Z1231 Encounter for screening mammogram for malignant neoplasm of breast: Secondary | ICD-10-CM | POA: Diagnosis not present

## 2019-01-01 NOTE — Progress Notes (Signed)
Normal mammogram

## 2019-01-02 NOTE — Progress Notes (Signed)
Office: [phone]  /  Fax: [phone] TeleHealth Visit:  Lisa Stanton has verbally consented to this TeleHealth visit today. The patient is located at home, the provider is located at the [facility] office. The participants in this visit include the listed provider and patient. The visit was conducted today via [email or web address].  HPI:   Chief Complaint: OBESITY Lisa Stanton is here to discuss her progress with her obesity treatment plan. She is on the Category 3 plan and is following her eating plan approximately 80 % of the time. She states she is more mindful of stress and movement. Lisa Stanton feels she is doing well maintaining her weight and thinks she may have lost another lb since hr last visit. She notes her hunger is controlled but she struggles with PM snacking.  We were unable to weigh the patient today for this TeleHealth visit. She feels as if she has lost 1 lb since her last visit. She has lost 28 lbs since starting treatment with [name].  Pre-Diabetes Lisa Stanton has a diagnosis of pre-diabetes based on her elevated Hgb A1c and was informed this puts her at greater risk of developing diabetes. Her A1c is stable at 5.4 which has greatly improved from 6.1 last year. She is doing well with diet and weight loss to decrease risk of diabetes. She denies nausea or hypoglycemia.  Dehydration Lisa Stanton notes increased dry mouth and decreased ability to drink H20 in the daytime due to a mask at work. Her BUN/creatinine ratio increased.  ASSESSMENT AND PLAN:  Prediabetes  Dehydration, mild  Class 1 obesity with serious comorbidity and body mass index (BMI) of 34.0 to 34.9 in adult, unspecified obesity type  PLAN:  Pre-Diabetes Lisa Stanton will continue to work on diet, exercise, and weight loss, and decreasing simple carbohydrates in her diet to help decrease the risk of diabetes. We dicussed metformin including benefits and risks. She was informed that eating too many simple  carbohydrates or too many calories at one sitting increases the likelihood of GI side effects. Lisa Stanton agrees to follow up with our clinic in 3 weeks as directed to monitor her progress.  Dehydration We discussed ways to try to increase H20 intake at least before and after work.  I spent > than 50% of the 25 minute visit on counseling as documented in the note.  Obesity Lisa Stanton is currently in the action stage of change. As such, her goal is to continue with weight loss efforts She has agreed to follow the Category 3 plan Lisa Stanton has been instructed to work up to a goal of 150 minutes of combined cardio and strengthening exercise per week for weight loss and overall health benefits. We discussed the following Behavioral Modification Strategies today: work on meal planning and easy cooking plans, increase H20 intake, better snacking choices, and ways to avoid night time snacking   Lisa Stanton has agreed to follow up with our clinic in 3 weeks. She was informed of the importance of frequent follow up visits to maximize her success with intensive lifestyle modifications for her multiple health conditions.  ALLERGIES: Allergies  Allergen Reactions  . Benicar Hct [Olmesartan Medoxomil-Hctz] Shortness Of Breath  . Lisinopril Other (See Comments)    Cough   . Singulair [Montelukast Sodium] Other (See Comments)    Cough   . Crest [Sodium Fluoride] Rash and Other (See Comments)    Redness    MEDICATIONS: Current Outpatient Medications on File Prior to Visit  Medication Sig Dispense Refill  . Azilsartan-Chlorthalidone (EDARBYCLOR)  40-12.5 MG TABS Take 1 tablet by mouth daily. 90 tablet 3  . cetirizine (ZYRTEC) 10 MG tablet Take 10 mg by mouth at bedtime as needed for allergies.     . cholecalciferol (VITAMIN D) 25 MCG (1000 UT) tablet Take 1,000 Units by mouth at bedtime.    . docusate sodium (COLACE) 100 MG capsule Take 1 capsule (100 mg total) by mouth 2 (two) times daily. (Patient  taking differently: Take 100 mg by mouth at bedtime. ) 60 capsule 0  . fluticasone (FLONASE) 50 MCG/ACT nasal spray Place 2 sprays into both nostrils daily as needed for allergies or rhinitis.     [name] ibuprofen (ADVIL) 600 MG tablet Take 1 tablet (600 mg total) by mouth every 6 (six) hours as needed for moderate pain. For AFTER surgery 30 tablet 0  . Multiple Vitamin (MULTIVITAMIN WITH MINERALS) TABS tablet Take 1 tablet by mouth at bedtime.    [name] omeprazole (PRILOSEC) 20 MG capsule Take 20 mg by mouth daily before breakfast.     . polyethylene glycol powder (GLYCOLAX/MIRALAX) 17 GM/SCOOP powder Take 17 g by mouth daily as needed (constipation).    . pravastatin (PRAVACHOL) 80 MG tablet Take 1 tablet (80 mg total) by mouth daily. (Patient taking differently: Take 80 mg by mouth every evening. ) 90 tablet 3   No current facility-administered medications on file prior to visit.     PAST MEDICAL HISTORY: Past Medical History:  Diagnosis Date  . Anemia   . Arthritis   . Back pain   . Complication of anesthesia    woke up during 1986 left knee surgery  . Constipation   . Dyspnea    caused by Benicar, no other issue at this time  . Fatty liver   . GERD (gastroesophageal reflux disease)   . Hip pain   . History of abdominal hernia   . History of carpal tunnel syndrome    Bilateral  . History of gallstones   . History of umbilical hernia   . Hyperlipidemia   . Hypertension   . Iron deficiency anemia   . Joint pain   . Knee pain   . Lower leg edema   . Murmur   . Pneumonia    9th grade  . PONV (postoperative nausea and vomiting)   . Thyroid nodule    right side  . Vitamin B 12 deficiency    resolved    PAST SURGICAL HISTORY: Past Surgical History:  Procedure Laterality Date  . ABDOMINAL HERNIA REPAIR  09/2010   incarcerated that required surgery.    . ABDOMINAL HYSTERECTOMY     partial  . ABDOMINOPLASTY  2006  . APPENDECTOMY  February 2004  . BIOPSY  10/02/2018   Procedure:  BIOPSY;  Surgeon: Yetta Flock, MD;  Location: [facility];  Service: Gastroenterology;;  . Wilmon Pali RELEASE  May 1998   Right hand  . CHOLECYSTECTOMY  February 2003  . COLONOSCOPY    . Cysts removed  September 1993   Left hand  . ESOPHAGOGASTRODUODENOSCOPY (EGD) WITH PROPOFOL N/A 10/02/2018   Procedure: ESOPHAGOGASTRODUODENOSCOPY (EGD) WITH PROPOFOL;  Surgeon: Yetta Flock, MD;  Location: [facility];  Service: Gastroenterology;  Laterality: N/A;  . KNEE SURGERY  July 1986   Left knee  . LAPAROSCOPIC LYSIS OF ADHESIONS N/A 10/29/2018   Procedure: LAPAROSCOPIC LYSIS OF ADHESIONS;  Surgeon: Everitt Amber, MD;  Location: [facility];  Service: Gynecology;  Laterality: N/A;  . PARTIAL HYSTERECTOMY  November 2010  .  ROBOTIC ASSISTED SALPINGO OOPHERECTOMY Bilateral 10/29/2018   Procedure: XI ROBOTIC ASSISTED BILATERAL SALPINGO OOPHORECTOMY;  Surgeon: Everitt Amber, MD;  Location: [facility];  Service: Gynecology;  Laterality: Bilateral;  . ROUX-EN-Y GASTRIC BYPASS  06/2000  . UMBILICAL HERNIA REPAIR    . WISDOM TOOTH EXTRACTION    . XI ROBOTIC ASSISTED INGUINAL HERNIA REPAIR WITH MESH N/A 10/29/2018   Procedure: ATTEMPTED LAPAROSCOPIC CONVERTED TO OPEN INCISIONAL HERNIA REPAIR WITH MESH;  Surgeon: Ralene Ok, MD;  Location: [facility];  Service: General;  Laterality: N/A;    SOCIAL HISTORY: Social History   Tobacco Use  . Smoking status: Never Smoker  . Smokeless tobacco: Never Used  Substance Use Topics  . Alcohol use: Yes    Comment: 6 times/year  . Drug use: No    FAMILY HISTORY: Family History  Problem Relation Age of Onset  . Osteoporosis Mother   . Rheum arthritis Mother   . Kidney disease Mother   . Thyroid disease Mother   . Obesity Mother   . Breast cancer Maternal Grandmother   . Colon cancer Maternal Grandfather   . Thyroid cancer Brother 93  . Cancer Brother        thyroid  . Diabetes Father   . Hypertension Father   . Hyperlipidemia Father   . Cancer  Father        pancreatic  . Obesity Father   . Diabetes Paternal Grandfather   . Heart disease Paternal Grandfather   . Cancer Sister        thyroid    ROS: Review of Systems  Constitutional: Positive for weight loss.  Gastrointestinal: Negative for nausea.  Endo/Heme/Allergies:       Negative hypoglycemia    PHYSICAL EXAM: Pt in no acute distress  RECENT LABS AND TESTS: BMET    Component Value Date/Time   NA 144 12/16/2018 1117   K 4.0 12/16/2018 1117   CL 102 12/16/2018 1117   CL 97 06/26/2014   CO2 26 12/16/2018 1117   GLUCOSE 95 12/16/2018 1117   GLUCOSE 113 (H) 10/30/2018 0404   BUN 16 12/16/2018 1117   CREATININE 0.57 12/16/2018 1117   CREATININE 0.65 02/05/2018 0913   CALCIUM 9.9 12/16/2018 1117   CALCIUM 10.4 01/26/2015   CALCIUM 9.1 06/26/2014   GFRNONAA 107 12/16/2018 1117   GFRNONAA 103 02/05/2018 0913   GFRAA 123 12/16/2018 1117   GFRAA 119 02/05/2018 0913   Lab Results  Component Value Date   HGBA1C 5.4 12/16/2018   HGBA1C 5.8 (H) 06/10/2018   HGBA1C 6.1 (H) 08/22/2017   HGBA1C 5.9 (H) 02/12/2017   HGBA1C 6.1 (H) 01/25/2016   Lab Results  Component Value Date   INSULIN 23.1 12/16/2018   INSULIN 24.1 06/10/2018   CBC    Component Value Date/Time   WBC 7.4 12/16/2018 1117   WBC 9.1 10/30/2018 0404   RBC 4.27 12/16/2018 1117   RBC 3.36 (L) 10/30/2018 0404   HGB 12.3 12/16/2018 1117   HCT 38.9 12/16/2018 1117   PLT 260 10/30/2018 0404   MCV 91 12/16/2018 1117   MCH 28.8 12/16/2018 1117   MCH 30.4 10/30/2018 0404   MCHC 31.6 12/16/2018 1117   MCHC 32.1 10/30/2018 0404   RDW 13.6 12/16/2018 1117   LYMPHSABS 2.4 12/16/2018 1117   MONOABS 522 01/25/2016 0919   EOSABS 0.1 12/16/2018 1117   BASOSABS 0.1 12/16/2018 1117   Iron/TIBC/Ferritin/ %Sat    Component Value Date/Time   IRON 96 06/10/2018 0956  TIBC 353 06/10/2018 0956   FERRITIN 62 06/10/2018 0956   IRONPCTSAT 27 06/10/2018 0956   Lipid Panel     Component Value  Date/Time   CHOL 163 12/16/2018 1117   TRIG 153 (H) 12/16/2018 1117   HDL 44 12/16/2018 1117   CHOLHDL 4.0 02/05/2018 0913   VLDL 28 01/25/2016 0919   LDLCALC 88 12/16/2018 1117   LDLCALC 103 (H) 02/05/2018 0913   Hepatic Function Panel     Component Value Date/Time   PROT 6.9 12/16/2018 1117   ALBUMIN 4.4 12/16/2018 1117   AST 22 12/16/2018 1117   ALT 24 12/16/2018 1117   ALKPHOS 105 12/16/2018 1117   BILITOT 0.2 12/16/2018 1117   BILIDIR 0.1 01/28/2014 0914   IBILI 0.3 01/28/2014 0914      Component Value Date/Time   TSH 2.110 06/10/2018 0956   TSH 1.78 02/05/2018 0913   TSH 2.29 02/12/2017 0747      I, Trixie Dredge, am acting as transcriptionist for Dennard Nip, MD I have reviewed the above documentation for accuracy and completeness, and I agree with the above. -Dennard Nip, MD

## 2019-01-08 ENCOUNTER — Encounter (INDEPENDENT_AMBULATORY_CARE_PROVIDER_SITE_OTHER): Payer: Self-pay | Admitting: Family Medicine

## 2019-01-08 NOTE — Telephone Encounter (Signed)
Please advise 

## 2019-02-06 ENCOUNTER — Other Ambulatory Visit: Payer: Self-pay

## 2019-02-06 ENCOUNTER — Ambulatory Visit (INDEPENDENT_AMBULATORY_CARE_PROVIDER_SITE_OTHER): Payer: BC Managed Care – PPO | Admitting: Family Medicine

## 2019-02-06 ENCOUNTER — Encounter: Payer: Self-pay | Admitting: Physician Assistant

## 2019-02-06 ENCOUNTER — Ambulatory Visit (INDEPENDENT_AMBULATORY_CARE_PROVIDER_SITE_OTHER): Payer: BC Managed Care – PPO | Admitting: Physician Assistant

## 2019-02-06 VITALS — BP 109/59 | HR 75 | Temp 98.6°F | Ht 64.0 in | Wt 192.0 lb

## 2019-02-06 VITALS — BP 107/69 | HR 70 | Temp 98.0°F | Ht 63.0 in | Wt 190.0 lb

## 2019-02-06 DIAGNOSIS — I10 Essential (primary) hypertension: Secondary | ICD-10-CM | POA: Diagnosis not present

## 2019-02-06 DIAGNOSIS — E6609 Other obesity due to excess calories: Secondary | ICD-10-CM | POA: Diagnosis not present

## 2019-02-06 DIAGNOSIS — Z23 Encounter for immunization: Secondary | ICD-10-CM | POA: Diagnosis not present

## 2019-02-06 DIAGNOSIS — K219 Gastro-esophageal reflux disease without esophagitis: Secondary | ICD-10-CM

## 2019-02-06 DIAGNOSIS — Z9189 Other specified personal risk factors, not elsewhere classified: Secondary | ICD-10-CM

## 2019-02-06 DIAGNOSIS — E8940 Asymptomatic postprocedural ovarian failure: Secondary | ICD-10-CM

## 2019-02-06 DIAGNOSIS — R5383 Other fatigue: Secondary | ICD-10-CM

## 2019-02-06 DIAGNOSIS — F3289 Other specified depressive episodes: Secondary | ICD-10-CM

## 2019-02-06 DIAGNOSIS — E782 Mixed hyperlipidemia: Secondary | ICD-10-CM | POA: Diagnosis not present

## 2019-02-06 DIAGNOSIS — Z6833 Body mass index (BMI) 33.0-33.9, adult: Secondary | ICD-10-CM

## 2019-02-06 DIAGNOSIS — Z0000 Encounter for general adult medical examination without abnormal findings: Secondary | ICD-10-CM | POA: Diagnosis not present

## 2019-02-06 MED ORDER — EDARBYCLOR 40-12.5 MG PO TABS: 1.0000 | ORAL_TABLET | Freq: Every day | ORAL | 3 refills | Status: DC

## 2019-02-06 MED ORDER — BUPROPION HCL ER (SR) 150 MG PO TB12: 150.0000 mg | ORAL_TABLET | ORAL | 0 refills | Status: AC

## 2019-02-06 MED ORDER — OMEPRAZOLE 20 MG PO CPDR: 20.0000 mg | DELAYED_RELEASE_CAPSULE | Freq: Every day | ORAL | 3 refills | Status: AC

## 2019-02-06 MED ORDER — PRAVASTATIN SODIUM 80 MG PO TABS: 80.0000 mg | ORAL_TABLET | Freq: Every evening | ORAL | 3 refills | Status: AC

## 2019-02-06 NOTE — Progress Notes (Signed)
Subjective:     Lisa Stanton is a 53 y.o. female and is here for a comprehensive physical exam. The patient reports problems - noticed her BP going a little low at times on current BP medication. she has lost 30lbs with Dr. Trixie Rude at [facility] with diet and exercise. she continues to have some intermittent low back pain. PT and dryneedling helps but does make it harder to exercise. .   She did have hysterectomy and removed both ovaries due to mass. NO cancer.       Social History   Socioeconomic History  . Marital status: Married    Spouse name: Doren Custard  . Number of children: 0  . Years of education: HS  . Highest education level: Not on file  Occupational History  . Occupation: [employer]  . Financial resource strain: Not on file  . Food insecurity    Worry: Not on file    Inability: Not on file  . Transportation needs    Medical: Not on file    Non-medical: Not on file  Tobacco Use  . Smoking status: Never Smoker  . Smokeless tobacco: Never Used  Substance and Sexual Activity  . Alcohol use: Yes    Comment: 6 times/year  . Drug use: No  . Sexual activity: Yes    Partners: Male    Birth control/protection: Surgical  Lifestyle  . Physical activity    Days per week: Not on file    Minutes per session: Not on file  . Stress: Not on file  Relationships  . Social Herbalist on phone: Not on file    Gets together: Not on file    Attends religious service: Not on file    Active member of club or organization: Not on file    Attends meetings of clubs or organizations: Not on file    Relationship status: Not on file  . Intimate partner violence    Fear of current or ex partner: Not on file    Emotionally abused: Not on file    Physically abused: Not on file    Forced sexual activity: Not on file  Other Topics Concern  . Not on file  Social History Narrative   2 caffeine per day. Regular exercise, walking.      Health Maintenance  Topic Date Due  . HIV Screening  02/06/2020 (Originally 12/10/1980)  . TETANUS/TDAP  12/10/2019  . MAMMOGRAM  01/01/2020  . COLONOSCOPY  03/28/2027  . INFLUENZA VACCINE  Completed    The following portions of the patient's history were reviewed and updated as appropriate: allergies, current medications, past family history, past medical history, past social history, past surgical history and problem list.  Review of Systems A comprehensive review of systems was negative.   Objective:    BP (!) 109/59   Pulse 75   Temp 98.6 F (37 C) (Oral)   Ht 5\' 4"  (1.626 m)   Wt 192 lb (87.1 kg)   LMP 03/15/2009   SpO2 98%   BMI 32.96 kg/m  General appearance: alert, cooperative, appears stated age and mildly obese Head: Normocephalic, without obvious abnormality, atraumatic Eyes: conjunctivae/corneas clear. PERRL, EOM's intact. Fundi benign. Ears: normal TM's and external ear canals both ears Nose: Nares normal. Septum midline. Mucosa normal. No drainage or sinus tenderness. Throat: lips, mucosa, and tongue normal; teeth and gums normal Neck: no adenopathy, no carotid bruit, no JVD, supple, symmetrical,  trachea midline and thyroid not enlarged, symmetric, no tenderness/mass/nodules Back: symmetric, no curvature. ROM normal. No CVA tenderness. Lungs: clear to auscultation bilaterally Heart: regular rate and rhythm, S1, S2 normal, no murmur, click, rub or gallop Abdomen: soft, non-tender; bowel sounds normal; no masses,  no organomegaly Extremities: extremities normal, atraumatic, no cyanosis or edema Pulses: 2+ and symmetric Skin: Skin color, texture, turgor normal. No rashes or lesions Lymph nodes: Cervical, supraclavicular, and axillary nodes normal. Neurologic: Alert and oriented X 3, normal strength and tone. Normal symmetric reflexes. Normal coordination and gait    .[name] Depression screen [facility] 2/9 02/06/2019 06/10/2018 02/05/2018 08/22/2017 02/05/2017  Decreased  Interest 0 2 1 2 1   Down, Depressed, Hopeless 0 2 1 1 1   PHQ - 2 Score 0 4 2 3 2   Altered sleeping 2 3 3 3 3   Tired, decreased energy 1 2 1 2 3   Change in appetite 0 2 2 1 1   Feeling bad or failure about yourself  0 0 1 1 1   Trouble concentrating 0 1 1 1 1   Moving slowly or fidgety/restless 0 1 0 0 1  Suicidal thoughts 0 0 0 0 0  PHQ-9 Score 3 13 10 11 12   Difficult doing work/chores Somewhat difficult Somewhat difficult Not difficult at all Very difficult -    Assessment:    Healthy female exam.      Plan:    Marland KitchenMarland KitchenGwendolyn was seen today for annual exam.  Diagnoses and all orders for this visit:  Routine physical examination  Flu vaccine need -     Flu Vaccine QUAD 36+ mos IM  Mixed hyperlipidemia -     pravastatin (PRAVACHOL) 80 MG tablet; Take 1 tablet (80 mg total) by mouth every evening.  Essential hypertension, benign -     Azilsartan-Chlorthalidone (EDARBYCLOR) 40-12.5 MG TABS; Take 1 tablet by mouth daily.  Need for shingles vaccine -     Varicella-zoster vaccine IM  Surgical menopause  Gastroesophageal reflux disease, esophagitis presence not specified -     omeprazole (PRILOSEC) 20 MG capsule; Take 1 capsule (20 mg total) by mouth daily before breakfast.   .. Discussed 150 minutes of exercise a week.  Encouraged vitamin D 1000 units and Calcium 1300mg  or 4 servings of dairy a day.  Mammogram UTD.  Due to hysterectomy no need for pap.  Colonoscopy UTD.  Flu shot and shingles shot given today. Follow up in 2 months for next shingles vaccine.   Reviewed labs.    BP is low today. Cut BP medication in half and continue to monitor BP. Discussed signs of hypotension. Call if she is having any signs or symptoms. As she loses weight could consider coming off rx.   Continue to follow with Dr. Trixie Rude to get to goal BMI of 27.    See After Visit Summary for Counseling Recommendations

## 2019-02-06 NOTE — Patient Instructions (Signed)
Health Maintenance, Female Adopting a healthy lifestyle and getting preventive care are important in promoting health and wellness. Ask your health care provider about:  The right schedule for you to have regular tests and exams.  Things you can do on your own to prevent diseases and keep yourself healthy. What should I know about diet, weight, and exercise? Eat a healthy diet   Eat a diet that includes plenty of vegetables, fruits, low-fat dairy products, and lean protein.  Do not eat a lot of foods that are high in solid fats, added sugars, or sodium. Maintain a healthy weight Body mass index (BMI) is used to identify weight problems. It estimates body fat based on height and weight. Your health care provider can help determine your BMI and help you achieve or maintain a healthy weight. Get regular exercise Get regular exercise. This is one of the most important things you can do for your health. Most adults should:  Exercise for at least 150 minutes each week. The exercise should increase your heart rate and make you sweat (moderate-intensity exercise).  Do strengthening exercises at least twice a week. This is in addition to the moderate-intensity exercise.  Spend less time sitting. Even light physical activity can be beneficial. Watch cholesterol and blood lipids Have your blood tested for lipids and cholesterol at 52 years of age, then have this test every 5 years. Have your cholesterol levels checked more often if:  Your lipid or cholesterol levels are high.  You are older than 52 years of age.  You are at high risk for heart disease. What should I know about cancer screening? Depending on your health history and family history, you may need to have cancer screening at various ages. This may include screening for:  Breast cancer.  Cervical cancer.  Colorectal cancer.  Skin cancer.  Lung cancer. What should I know about heart disease, diabetes, and high blood  pressure? Blood pressure and heart disease  High blood pressure causes heart disease and increases the risk of stroke. This is more likely to develop in people who have high blood pressure readings, are of African descent, or are overweight.  Have your blood pressure checked: ? Every 3-5 years if you are 18-39 years of age. ? Every year if you are 40 years old or older. Diabetes Have regular diabetes screenings. This checks your fasting blood sugar level. Have the screening done:  Once every three years after age 40 if you are at a normal weight and have a low risk for diabetes.  More often and at a younger age if you are overweight or have a high risk for diabetes. What should I know about preventing infection? Hepatitis B If you have a higher risk for hepatitis B, you should be screened for this virus. Talk with your health care provider to find out if you are at risk for hepatitis B infection. Hepatitis C Testing is recommended for:  Everyone born from 1945 through 1965.  Anyone with known risk factors for hepatitis C. Sexually transmitted infections (STIs)  Get screened for STIs, including gonorrhea and chlamydia, if: ? You are sexually active and are younger than 52 years of age. ? You are older than 52 years of age and your health care provider tells you that you are at risk for this type of infection. ? Your sexual activity has changed since you were last screened, and you are at increased risk for chlamydia or gonorrhea. Ask your health care provider if   you are at risk.  Ask your health care provider about whether you are at high risk for HIV. Your health care provider may recommend a prescription medicine to help prevent HIV infection. If you choose to take medicine to prevent HIV, you should first get tested for HIV. You should then be tested every 3 months for as long as you are taking the medicine. Pregnancy  If you are about to stop having your period (premenopausal) and  you may become pregnant, seek counseling before you get pregnant.  Take 400 to 800 micrograms (mcg) of folic acid every day if you become pregnant.  Ask for birth control (contraception) if you want to prevent pregnancy. Osteoporosis and menopause Osteoporosis is a disease in which the bones lose minerals and strength with aging. This can result in bone fractures. If you are 65 years old or older, or if you are at risk for osteoporosis and fractures, ask your health care provider if you should:  Be screened for bone loss.  Take a calcium or vitamin D supplement to lower your risk of fractures.  Be given hormone replacement therapy (HRT) to treat symptoms of menopause. Follow these instructions at home: Lifestyle  Do not use any products that contain nicotine or tobacco, such as cigarettes, e-cigarettes, and chewing tobacco. If you need help quitting, ask your health care provider.  Do not use street drugs.  Do not share needles.  Ask your health care provider for help if you need support or information about quitting drugs. Alcohol use  Do not drink alcohol if: ? Your health care provider tells you not to drink. ? You are pregnant, may be pregnant, or are planning to become pregnant.  If you drink alcohol: ? Limit how much you use to 0-1 drink a day. ? Limit intake if you are breastfeeding.  Be aware of how much alcohol is in your drink. In the [place] one drink equals one 12 oz bottle of beer (355 mL), one 5 oz glass of wine (148 mL), or one 1 oz glass of hard liquor (44 mL). General instructions  Schedule regular health, dental, and eye exams.  Stay current with your vaccines.  Tell your health care provider if: ? You often feel depressed. ? You have ever been abused or do not feel safe at home. Summary  Adopting a healthy lifestyle and getting preventive care are important in promoting health and wellness.  Follow your health care provider's instructions about healthy  diet, exercising, and getting tested or screened for diseases.  Follow your health care provider's instructions on monitoring your cholesterol and blood pressure. This information is not intended to replace advice given to you by your health care provider. Make sure you discuss any questions you have with your health care provider. Document Released: 11/14/2010 Document Revised: 04/24/2018 Document Reviewed: 04/24/2018 Elsevier Patient Education  2020 [employer]  

## 2019-02-07 ENCOUNTER — Encounter: Payer: BC Managed Care – PPO | Admitting: Physician Assistant

## 2019-02-07 LAB — TSH: TSH: 1.32 u[IU]/mL (ref 0.450–4.500)

## 2019-02-07 LAB — T4, FREE: Free T4: 1.04 ng/dL (ref 0.82–1.77)

## 2019-02-07 LAB — T3: T3, Total: 122 ng/dL (ref 71–180)

## 2019-02-10 DIAGNOSIS — K219 Gastro-esophageal reflux disease without esophagitis: Secondary | ICD-10-CM | POA: Insufficient documentation

## 2019-02-10 DIAGNOSIS — E8940 Asymptomatic postprocedural ovarian failure: Secondary | ICD-10-CM | POA: Insufficient documentation

## 2019-02-10 NOTE — Progress Notes (Signed)
Office: [phone]  /  Fax: [phone]   HPI:   Chief Complaint: OBESITY Lisa Stanton is here to discuss her progress with her obesity treatment plan. She is on the  follow the Category 3 plan and is following her eating plan approximately 80 % of the time. She states she is exercising by walking, stretching for 30 minutes 2 times per week. Lisa Stanton is doing well with weight loss on her plan but still struggles with PM cravings especially for simple carbohydrates. Her hunger is controlled.  Her weight is 190 lb (86.2 kg) today and has had a weight loss of 3 pounds over a period of 8 weeks since her last in office visit. She has lost 11 lbs since starting treatment with [name].  Fatigue Lisa Stanton feels her energy is lower than it should be. This has worsened with weight gain and has not worsened recently. Lisa Stanton denies daytime somnolence and patient admits to sleeping at least 7 hrs per night. She notes increased fatigue over the past few months. She has no recent labs and has family history of hypothyroidism.   Depression with emotional eating behaviors Lisa Stanton is struggling with emotional eating and using food for comfort to the extent that it is negatively impacting her health. She often snacks when she is not hungry. Lisa Stanton sometimes feels she is out of control and then feels guilty that she made poor food choices. She has been working on behavior modification techniques to help reduce her emotional eating and has been somewhat successful.She is still struggling with emotional eating, has tried to decreased on her own but would like to consider medications. She denies history of seizure disorder.   She shows no sign of suicidal or homicidal ideations.  Depression screen Lisa Stanton 2/9 02/06/2019 06/10/2018 02/05/2018 08/22/2017 02/05/2017  Decreased Interest 0 2 1 2 1   Down, Depressed, Hopeless 0 2 1 1 1   PHQ - 2 Score 0 4 2 3 2   Altered sleeping 2 3 3 3 3   Tired, decreased energy 1 2 1 2 3    Change in appetite 0 2 2 1 1   Feeling bad or failure about yourself  0 0 1 1 1   Trouble concentrating 0 1 1 1 1   Moving slowly or fidgety/restless 0 1 0 0 1  Suicidal thoughts 0 0 0 0 0  PHQ-9 Score 3 13 10 11 12   Difficult doing work/chores Somewhat difficult Somewhat difficult Not difficult at all Very difficult -    ASSESSMENT AND PLAN:  Other depression - with emotional eating  - Plan: buPROPion (WELLBUTRIN SR) 150 MG 12 hr tablet  Other fatigue - Plan: T3, T4, free, TSH  Class 1 obesity due to excess calories without serious comorbidity with body mass index (BMI) of 33.0 to 33.9 in adult  PLAN: Fatigue Lisa Stanton was informed that her fatigue may be related to obesity, depression or many other causes. Labs will be ordered, and in the meanwhile Lisa Stanton has agreed to work on diet, exercise and weight loss to help with fatigue. Proper sleep hygiene was discussed including the need for 7-8 hours of quality sleep each night. We will repeat labs.   Depression with Emotional Eating Behaviors We discussed behavior modification techniques today to help Lisa Stanton deal with her emotional eating and depression. She has agreed to start Wellbutrin SR 150 mg qd #30 with no refills and agreed to follow up as directed.  Obesity Lisa Stanton is currently in the action stage of change. As such, her goal is to  continue with weight loss efforts She has agreed to follow the Category 3 plan Lisa Stanton has been instructed to work up to a goal of 150 minutes of combined cardio and strengthening exercise per week for weight loss and overall health benefits. We discussed the following Behavioral Modification Strategies today: increasing lean protein intake, decreasing simple carbohydrates  and emotional eating strategies  Lisa Stanton has agreed to follow up with our clinic in 2 weeks. She was informed of the importance of frequent follow up visits to maximize her success with intensive lifestyle modifications  for her multiple health conditions.  ALLERGIES: Allergies  Allergen Reactions  . Benicar Hct [Olmesartan Medoxomil-Hctz] Shortness Of Breath  . Lisinopril Other (See Comments)    Cough   . Singulair [Montelukast Sodium] Other (See Comments)    Cough   . Crest [Sodium Fluoride] Rash and Other (See Comments)    Redness    MEDICATIONS: Current Outpatient Medications on File Prior to Visit  Medication Sig Dispense Refill  . cetirizine (ZYRTEC) 10 MG tablet Take 10 mg by mouth at bedtime as needed for allergies.     . cholecalciferol (VITAMIN D) 25 MCG (1000 UT) tablet Take 1,000 Units by mouth at bedtime.    . docusate sodium (COLACE) 100 MG capsule Take 1 capsule (100 mg total) by mouth 2 (two) times daily. (Patient taking differently: Take 100 mg by mouth at bedtime. ) 60 capsule 0  . fluticasone (FLONASE) 50 MCG/ACT nasal spray Place 2 sprays into both nostrils daily as needed for allergies or rhinitis.     [name] ibuprofen (ADVIL) 600 MG tablet Take 1 tablet (600 mg total) by mouth every 6 (six) hours as needed for moderate pain. For AFTER surgery 30 tablet 0  . Multiple Vitamin (MULTIVITAMIN WITH MINERALS) TABS tablet Take 1 tablet by mouth at bedtime.    . polyethylene glycol powder (GLYCOLAX/MIRALAX) 17 GM/SCOOP powder Take 17 g by mouth daily as needed (constipation).     No current Stanton-administered medications on file prior to visit.     PAST MEDICAL HISTORY: Past Medical History:  Diagnosis Date  . Anemia   . Arthritis   . Back pain   . Complication of anesthesia    woke up during 1986 left knee surgery  . Constipation   . Dyspnea    caused by Benicar, no other issue at this time  . Fatty liver   . GERD (gastroesophageal reflux disease)   . Hip pain   . History of abdominal hernia   . History of carpal tunnel syndrome    Bilateral  . History of gallstones   . History of umbilical hernia   . Hyperlipidemia   . Hypertension   . Iron deficiency anemia   . Joint  pain   . Knee pain   . Lower leg edema   . Murmur   . Pneumonia    9th grade  . PONV (postoperative nausea and vomiting)   . Thyroid nodule    right side  . Vitamin B 12 deficiency    resolved    PAST SURGICAL HISTORY: Past Surgical History:  Procedure Laterality Date  . ABDOMINAL HERNIA REPAIR  09/2010   incarcerated that required surgery.    . ABDOMINAL HYSTERECTOMY     partial  . ABDOMINOPLASTY  2006  . APPENDECTOMY  February 2004  . BIOPSY  10/02/2018   Procedure: BIOPSY;  Surgeon: Yetta Flock, MD;  Location: [facility];  Service: Gastroenterology;;  . Wilmon Pali RELEASE  May 1998   Right hand  . CHOLECYSTECTOMY  February 2003  . COLONOSCOPY    . Cysts removed  September 1993   Left hand  . ESOPHAGOGASTRODUODENOSCOPY (EGD) WITH PROPOFOL N/A 10/02/2018   Procedure: ESOPHAGOGASTRODUODENOSCOPY (EGD) WITH PROPOFOL;  Surgeon: Yetta Flock, MD;  Location: [facility];  Service: Gastroenterology;  Laterality: N/A;  . KNEE SURGERY  July 1986   Left knee  . LAPAROSCOPIC LYSIS OF ADHESIONS N/A 10/29/2018   Procedure: LAPAROSCOPIC LYSIS OF ADHESIONS;  Surgeon: Everitt Amber, MD;  Location: [facility];  Service: Gynecology;  Laterality: N/A;  . PARTIAL HYSTERECTOMY  November 2010  . ROBOTIC ASSISTED SALPINGO OOPHERECTOMY Bilateral 10/29/2018   Procedure: XI ROBOTIC ASSISTED BILATERAL SALPINGO OOPHORECTOMY;  Surgeon: Everitt Amber, MD;  Location: [facility];  Service: Gynecology;  Laterality: Bilateral;  . ROUX-EN-Y GASTRIC BYPASS  06/2000  . UMBILICAL HERNIA REPAIR    . WISDOM TOOTH EXTRACTION    . XI ROBOTIC ASSISTED INGUINAL HERNIA REPAIR WITH MESH N/A 10/29/2018   Procedure: ATTEMPTED LAPAROSCOPIC CONVERTED TO OPEN INCISIONAL HERNIA REPAIR WITH MESH;  Surgeon: Ralene Ok, MD;  Location: [facility];  Service: General;  Laterality: N/A;    SOCIAL HISTORY: Social History   Tobacco Use  . Smoking status: Never Smoker  . Smokeless tobacco: Never Used  Substance Use  Topics  . Alcohol use: Yes    Comment: 6 times/year  . Drug use: No    FAMILY HISTORY: Family History  Problem Relation Age of Onset  . Osteoporosis Mother   . Rheum arthritis Mother   . Kidney disease Mother   . Thyroid disease Mother   . Obesity Mother   . Breast cancer Maternal Grandmother   . Colon cancer Maternal Grandfather   . Thyroid cancer Brother 4  . Cancer Brother        thyroid  . Diabetes Father   . Hypertension Father   . Hyperlipidemia Father   . Cancer Father        pancreatic  . Obesity Father   . Diabetes Paternal Grandfather   . Heart disease Paternal Grandfather   . Cancer Sister        thyroid    ROS: Review of Systems  Constitutional: Positive for malaise/fatigue and weight loss.  Psychiatric/Behavioral: Positive for depression. Negative for suicidal ideas.    PHYSICAL EXAM: Blood pressure 107/69, pulse 70, temperature 98 F (36.7 C), temperature source Oral, height 5\' 3"  (1.6 m), weight 190 lb (86.2 kg), last menstrual period 03/15/2009, SpO2 99 %. Body mass index is 33.66 kg/m. Physical Exam Vitals signs reviewed.  Constitutional:      Appearance: Normal appearance. She is obese.  HENT:     Head: Normocephalic.     Nose: Nose normal.  Cardiovascular:     Rate and Rhythm: Normal rate.  Pulmonary:     Effort: Pulmonary effort is normal.  Musculoskeletal: Normal range of motion.  Skin:    General: Skin is warm and dry.  Neurological:     Mental Status: She is alert and oriented to person, place, and time.  Psychiatric:        Mood and Affect: Mood normal.        Behavior: Behavior normal.     RECENT LABS AND TESTS: BMET    Component Value Date/Time   NA 144 12/16/2018 1117   K 4.0 12/16/2018 1117   CL 102 12/16/2018 1117   CL 97 06/26/2014   CO2 26 12/16/2018 1117   GLUCOSE  95 12/16/2018 1117   GLUCOSE 113 (H) 10/30/2018 0404   BUN 16 12/16/2018 1117   CREATININE 0.57 12/16/2018 1117   CREATININE 0.65 02/05/2018 0913    CALCIUM 9.9 12/16/2018 1117   CALCIUM 10.4 01/26/2015   CALCIUM 9.1 06/26/2014   GFRNONAA 107 12/16/2018 1117   GFRNONAA 103 02/05/2018 0913   GFRAA 123 12/16/2018 1117   GFRAA 119 02/05/2018 0913   Lab Results  Component Value Date   HGBA1C 5.4 12/16/2018   HGBA1C 5.8 (H) 06/10/2018   HGBA1C 6.1 (H) 08/22/2017   HGBA1C 5.9 (H) 02/12/2017   HGBA1C 6.1 (H) 01/25/2016   Lab Results  Component Value Date   INSULIN 23.1 12/16/2018   INSULIN 24.1 06/10/2018   CBC    Component Value Date/Time   WBC 7.4 12/16/2018 1117   WBC 9.1 10/30/2018 0404   RBC 4.27 12/16/2018 1117   RBC 3.36 (L) 10/30/2018 0404   HGB 12.3 12/16/2018 1117   HCT 38.9 12/16/2018 1117   PLT 260 10/30/2018 0404   MCV 91 12/16/2018 1117   MCH 28.8 12/16/2018 1117   MCH 30.4 10/30/2018 0404   MCHC 31.6 12/16/2018 1117   MCHC 32.1 10/30/2018 0404   RDW 13.6 12/16/2018 1117   LYMPHSABS 2.4 12/16/2018 1117   MONOABS 522 01/25/2016 0919   EOSABS 0.1 12/16/2018 1117   BASOSABS 0.1 12/16/2018 1117   Iron/TIBC/Ferritin/ %Sat    Component Value Date/Time   IRON 96 06/10/2018 0956   TIBC 353 06/10/2018 0956   FERRITIN 62 06/10/2018 0956   IRONPCTSAT 27 06/10/2018 0956   Lipid Panel     Component Value Date/Time   CHOL 163 12/16/2018 1117   TRIG 153 (H) 12/16/2018 1117   HDL 44 12/16/2018 1117   CHOLHDL 4.0 02/05/2018 0913   VLDL 28 01/25/2016 0919   LDLCALC 88 12/16/2018 1117   LDLCALC 103 (H) 02/05/2018 0913   Hepatic Function Panel     Component Value Date/Time   PROT 6.9 12/16/2018 1117   ALBUMIN 4.4 12/16/2018 1117   AST 22 12/16/2018 1117   ALT 24 12/16/2018 1117   ALKPHOS 105 12/16/2018 1117   BILITOT 0.2 12/16/2018 1117   BILIDIR 0.1 01/28/2014 0914   IBILI 0.3 01/28/2014 0914      Component Value Date/Time   TSH 1.320 02/06/2019 1541   TSH 2.110 06/10/2018 0956   TSH 1.78 02/05/2018 0913      OBESITY BEHAVIORAL INTERVENTION VISIT  Today's visit was # 9   Starting weight:  221 lbs Starting date: 06/10/18 Today's weight : Weight: 190 lb (86.2 kg)  Today's date: 02/06/19 Total lbs lost to date: 11 lbs  At least 15 minutes were spent on discussing the following behavioral intervention visit.   ASK: We discussed the diagnosis of obesity with Lisa Stanton today and Lisa Stanton agreed to give [name] permission to discuss obesity behavioral modification therapy today.  ASSESS: Fabian has the diagnosis of obesity and her BMI today is 33.637 Lisa Stanton is in the action stage of change   ADVISE: Lisa Stanton was educated on the multiple health risks of obesity as well as the benefit of weight loss to improve her health. She was advised of the need for long term treatment and the importance of lifestyle modifications to improve her current health and to decrease her risk of future health problems.  AGREE: Multiple dietary modification options and treatment options were discussed and  Keyshawna agreed to follow the recommendations documented in the above note.  ARRANGENicki Reaper  was educated on the importance of frequent visits to treat obesity as outlined per CMS and USPSTF guidelines and agreed to schedule her next follow up appointment today.  I, Renee Ramus, am acting as transcriptionist for Dennard Nip, MD   I have reviewed the above documentation for accuracy and completeness, and I agree with the above. -Dennard Nip, MD

## 2019-02-12 ENCOUNTER — Encounter (INDEPENDENT_AMBULATORY_CARE_PROVIDER_SITE_OTHER): Payer: Self-pay | Admitting: Family Medicine

## 2019-02-12 NOTE — Telephone Encounter (Signed)
Please advise 

## 2019-02-21 ENCOUNTER — Encounter (INDEPENDENT_AMBULATORY_CARE_PROVIDER_SITE_OTHER): Payer: Self-pay | Admitting: Family Medicine

## 2019-02-24 ENCOUNTER — Ambulatory Visit (INDEPENDENT_AMBULATORY_CARE_PROVIDER_SITE_OTHER): Payer: BC Managed Care – PPO | Admitting: Family Medicine

## 2019-04-29 ENCOUNTER — Encounter: Payer: Self-pay | Admitting: Physician Assistant

## 2019-04-30 MED ORDER — VALSARTAN-HYDROCHLOROTHIAZIDE 80-12.5 MG PO TABS: 1.0000 | ORAL_TABLET | Freq: Every day | ORAL | 1 refills | Status: AC

## 2019-11-04 IMAGING — MG 2D DIGITAL SCREENING BILATERAL MAMMOGRAM WITH CAD AND ADJUNCT TO
6 of 10 series · 6 of 26 positions shown · non-contrast
Comparison: Previous exam(s).

CLINICAL DATA: Screening.

EXAM:
2D DIGITAL SCREENING BILATERAL MAMMOGRAM WITH CAD AND ADJUNCT TOMO

[R MLO (1 of 2)]
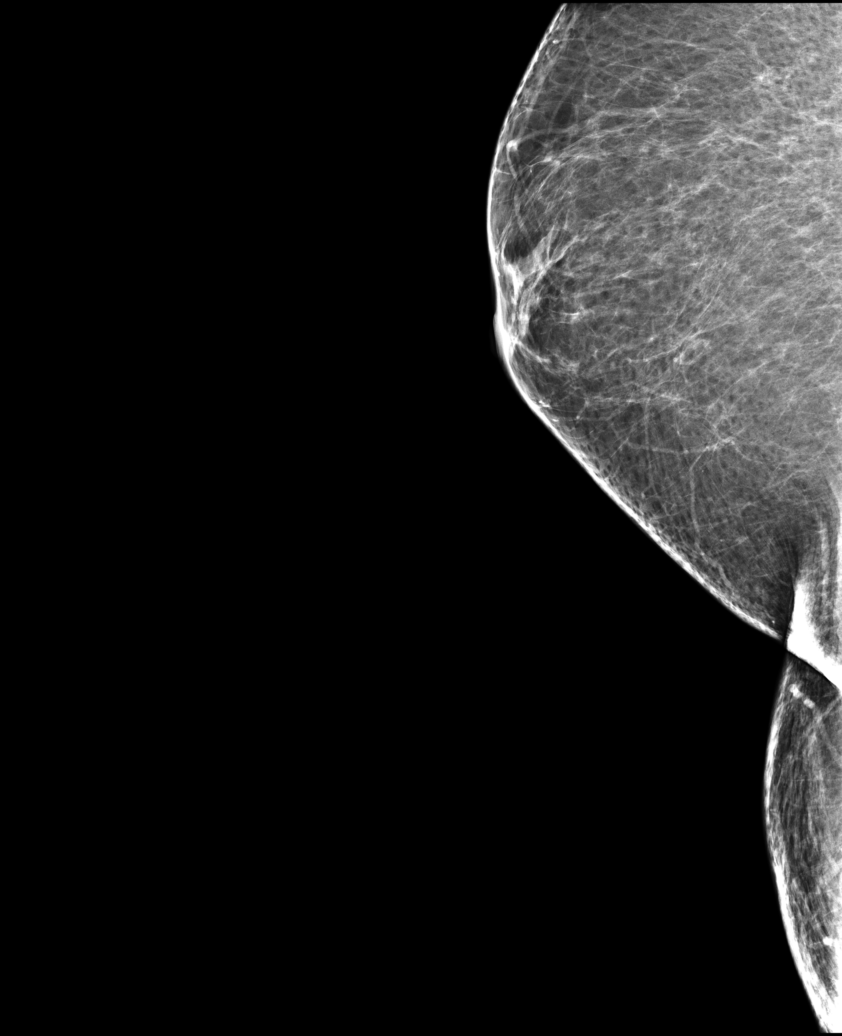

[L CV]
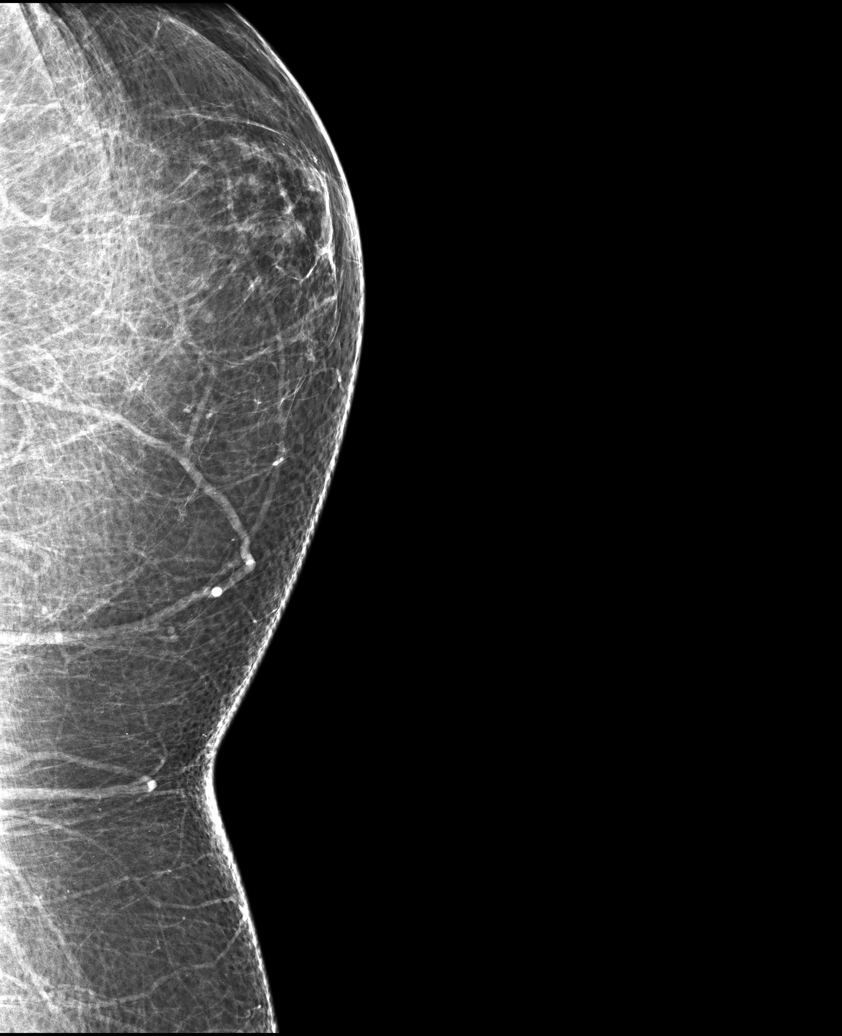

[L MLO]
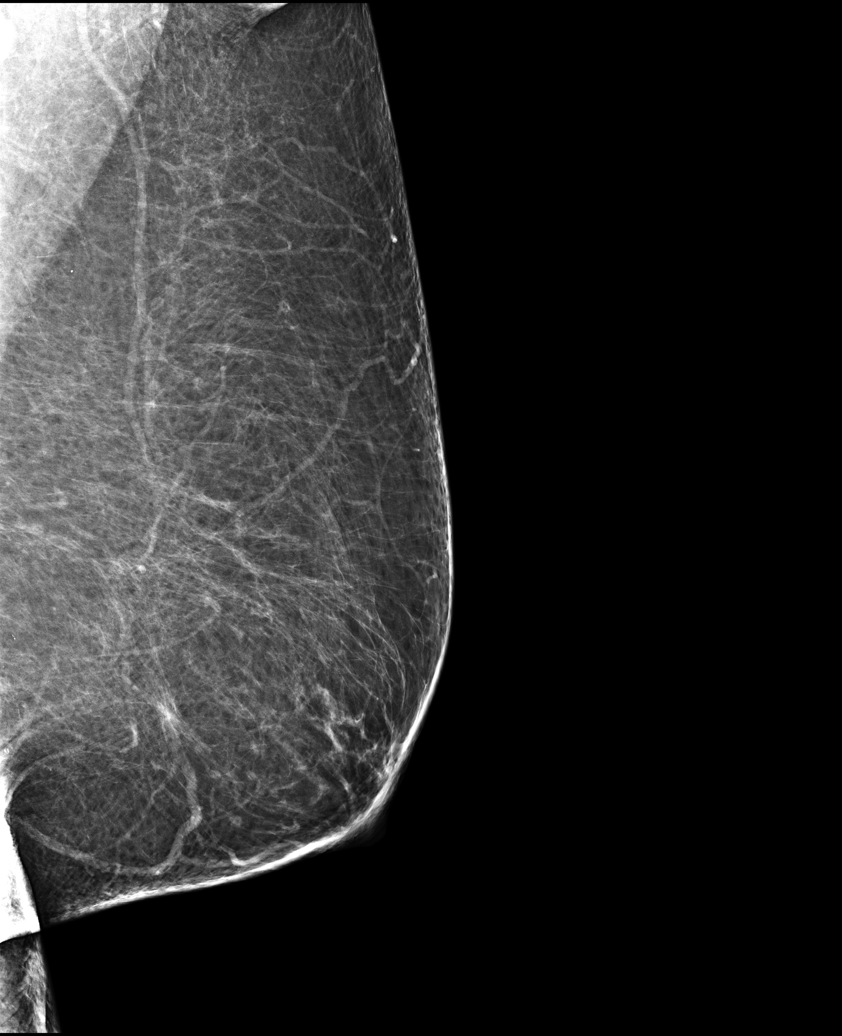

[R CC]
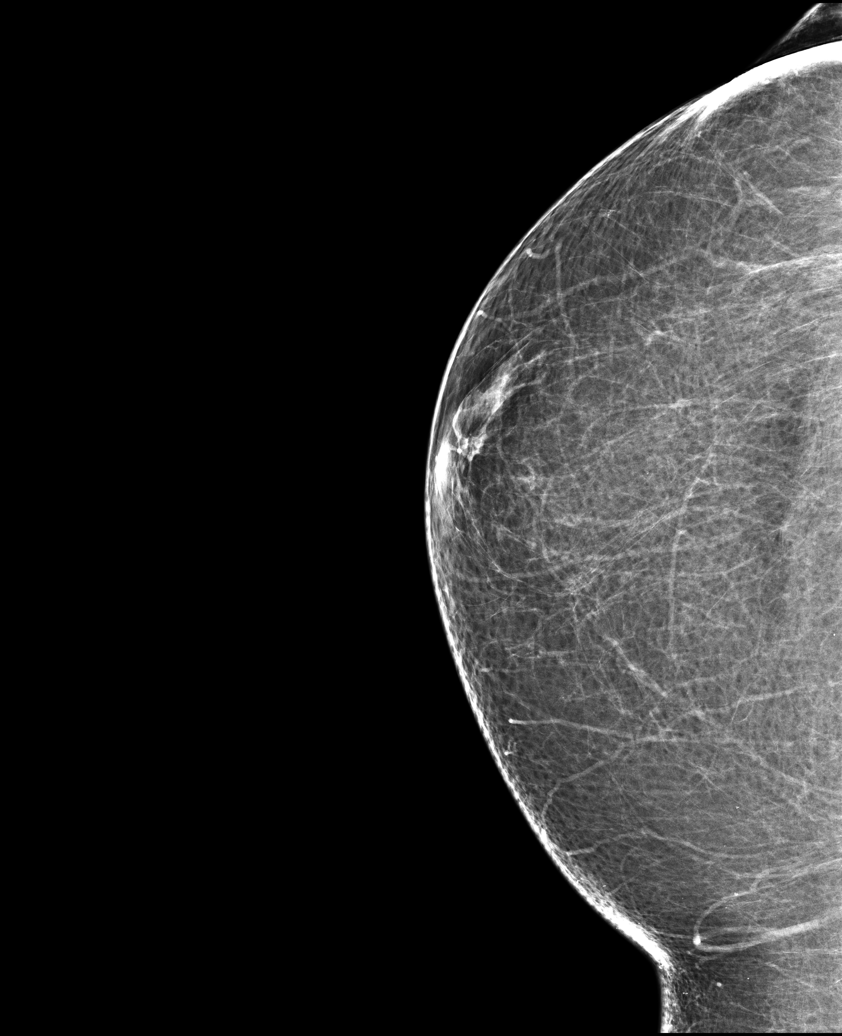

[R MLO (2 of 2)]
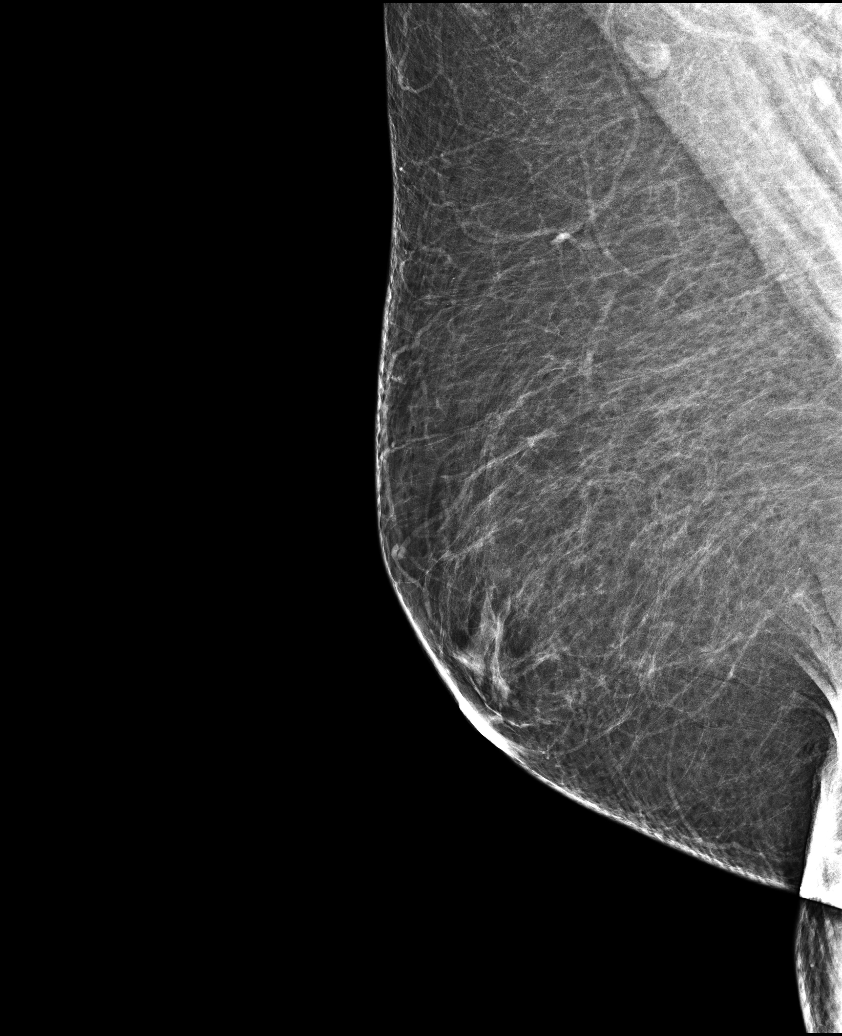

[L CC]
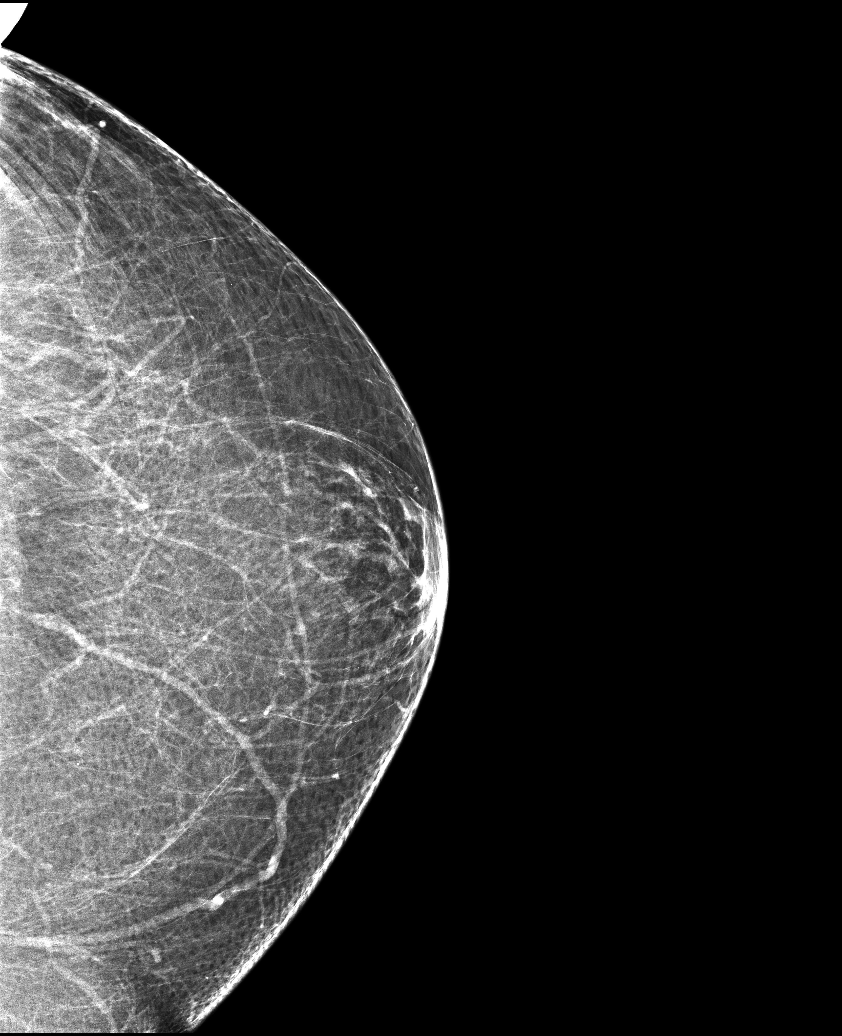

[6 of 26 positions shown; findings below may reference images not displayed]

ACR Breast Density Category b: There are scattered areas of
fibroglandular density.
FINDINGS: There are no findings suspicious for malignancy. Images were
processed with CAD.
IMPRESSION: No mammographic evidence of malignancy. A result letter of this
screening mammogram will be mailed directly to the patient.

RECOMMENDATION:
Screening mammogram in one year. (Code:97-6-RS4)

BI-RADS CATEGORY  1: Negative.

## 2021-12-21 ENCOUNTER — Encounter (INDEPENDENT_AMBULATORY_CARE_PROVIDER_SITE_OTHER): Payer: Self-pay
